# Patient Record
Sex: Female | Born: 1950
Health system: Southern US, Community
[De-identification: ages and names within clinical notes are randomized; demographics above are authoritative.]

## PROBLEM LIST (undated history)

## (undated) DIAGNOSIS — R7989 Other specified abnormal findings of blood chemistry: Secondary | ICD-10-CM

## (undated) DIAGNOSIS — N189 Chronic kidney disease, unspecified: Secondary | ICD-10-CM

## (undated) DIAGNOSIS — Z9889 Other specified postprocedural states: Secondary | ICD-10-CM

## (undated) DIAGNOSIS — R112 Nausea with vomiting, unspecified: Secondary | ICD-10-CM

## (undated) DIAGNOSIS — E039 Hypothyroidism, unspecified: Secondary | ICD-10-CM

## (undated) DIAGNOSIS — G43909 Migraine, unspecified, not intractable, without status migrainosus: Secondary | ICD-10-CM

## (undated) DIAGNOSIS — S3219XA Other fracture of sacrum, initial encounter for closed fracture: Secondary | ICD-10-CM

## (undated) DIAGNOSIS — M479 Spondylosis, unspecified: Secondary | ICD-10-CM

## (undated) DIAGNOSIS — Z8679 Personal history of other diseases of the circulatory system: Secondary | ICD-10-CM

## (undated) DIAGNOSIS — R87619 Unspecified abnormal cytological findings in specimens from cervix uteri: Secondary | ICD-10-CM

## (undated) DIAGNOSIS — E119 Type 2 diabetes mellitus without complications: Secondary | ICD-10-CM

## (undated) DIAGNOSIS — C9030 Solitary plasmacytoma not having achieved remission: Secondary | ICD-10-CM

## (undated) DIAGNOSIS — I10 Essential (primary) hypertension: Secondary | ICD-10-CM

## (undated) DIAGNOSIS — D219 Benign neoplasm of connective and other soft tissue, unspecified: Secondary | ICD-10-CM

## (undated) DIAGNOSIS — M1990 Unspecified osteoarthritis, unspecified site: Secondary | ICD-10-CM

## (undated) DIAGNOSIS — N939 Abnormal uterine and vaginal bleeding, unspecified: Secondary | ICD-10-CM

## (undated) DIAGNOSIS — D4709 Other mast cell neoplasms of uncertain behavior: Secondary | ICD-10-CM

## (undated) DIAGNOSIS — R011 Cardiac murmur, unspecified: Secondary | ICD-10-CM

## (undated) DIAGNOSIS — M8580 Other specified disorders of bone density and structure, unspecified site: Secondary | ICD-10-CM

## (undated) DIAGNOSIS — M353 Polymyalgia rheumatica: Secondary | ICD-10-CM

## (undated) DIAGNOSIS — D649 Anemia, unspecified: Secondary | ICD-10-CM

## (undated) DIAGNOSIS — Z803 Family history of malignant neoplasm of breast: Secondary | ICD-10-CM

## (undated) HISTORY — DX: Family history of malignant neoplasm of breast: Z80.3

## (undated) HISTORY — PX: BREAST BIOPSY: SHX20

## (undated) HISTORY — PX: CHOLECYSTECTOMY: SHX55

## (undated) HISTORY — DX: Cardiac murmur, unspecified: R01.1

## (undated) HISTORY — DX: Type 2 diabetes mellitus without complications: E11.9

## (undated) HISTORY — DX: Other specified disorders of bone density and structure, unspecified site: M85.80

## (undated) HISTORY — DX: Anemia, unspecified: D64.9

## (undated) HISTORY — PX: DILATION AND CURETTAGE OF UTERUS: SHX78

## (undated) HISTORY — PX: KNEE SURGERY: SHX244

## (undated) HISTORY — PX: COLONOSCOPY: SHX174

## (undated) HISTORY — DX: Other mast cell neoplasms of uncertain behavior: D47.09

## (undated) HISTORY — PX: CERVIX LESION DESTRUCTION: SHX591

## (undated) HISTORY — DX: Polymyalgia rheumatica: M35.3

## (undated) HISTORY — DX: Unspecified abnormal cytological findings in specimens from cervix uteri: R87.619

## (undated) HISTORY — DX: Benign neoplasm of connective and other soft tissue, unspecified: D21.9

## (undated) HISTORY — DX: Other specified abnormal findings of blood chemistry: R79.89

## (undated) HISTORY — DX: Other fracture of sacrum, initial encounter for closed fracture: S32.19XA

## (undated) HISTORY — DX: Essential (primary) hypertension: I10

## (undated) HISTORY — DX: Abnormal uterine and vaginal bleeding, unspecified: N93.9

## (undated) HISTORY — DX: Solitary plasmacytoma not having achieved remission: C90.30

---

## 1990-05-01 HISTORY — PX: CERVICAL BIOPSY  W/ LOOP ELECTRODE EXCISION: SUR135

## 1995-06-02 HISTORY — PX: HYSTEROSCOPY: SHX211

## 1997-10-26 ENCOUNTER — Ambulatory Visit (HOSPITAL_COMMUNITY): Admission: RE | Admit: 1997-10-26 | Discharge: 1997-10-26 | Payer: Self-pay | Admitting: Internal Medicine

## 1997-12-08 ENCOUNTER — Other Ambulatory Visit: Admission: RE | Admit: 1997-12-08 | Discharge: 1997-12-08 | Payer: Self-pay | Admitting: Obstetrics and Gynecology

## 1998-01-05 ENCOUNTER — Encounter: Admission: RE | Admit: 1998-01-05 | Discharge: 1998-01-05 | Payer: Self-pay | Admitting: Family Medicine

## 1998-06-01 HISTORY — PX: HYSTEROSCOPY: SHX211

## 1998-08-23 ENCOUNTER — Encounter: Payer: Self-pay | Admitting: Obstetrics and Gynecology

## 1998-08-24 ENCOUNTER — Observation Stay (HOSPITAL_COMMUNITY): Admission: RE | Admit: 1998-08-24 | Discharge: 1998-08-24 | Payer: Self-pay | Admitting: Obstetrics and Gynecology

## 1998-12-31 ENCOUNTER — Other Ambulatory Visit: Admission: RE | Admit: 1998-12-31 | Discharge: 1998-12-31 | Payer: Self-pay | Admitting: Obstetrics and Gynecology

## 1999-12-28 ENCOUNTER — Other Ambulatory Visit: Admission: RE | Admit: 1999-12-28 | Discharge: 1999-12-28 | Payer: Self-pay | Admitting: Obstetrics and Gynecology

## 2000-01-10 ENCOUNTER — Other Ambulatory Visit: Admission: RE | Admit: 2000-01-10 | Discharge: 2000-01-10 | Payer: Self-pay | Admitting: Obstetrics and Gynecology

## 2000-01-10 ENCOUNTER — Encounter (INDEPENDENT_AMBULATORY_CARE_PROVIDER_SITE_OTHER): Payer: Self-pay | Admitting: Specialist

## 2000-04-20 ENCOUNTER — Ambulatory Visit (HOSPITAL_COMMUNITY): Admission: RE | Admit: 2000-04-20 | Discharge: 2000-04-20 | Payer: Self-pay | Admitting: Gastroenterology

## 2000-05-29 ENCOUNTER — Ambulatory Visit (HOSPITAL_COMMUNITY): Admission: RE | Admit: 2000-05-29 | Discharge: 2000-05-29 | Payer: Self-pay | Admitting: Obstetrics and Gynecology

## 2001-02-06 ENCOUNTER — Other Ambulatory Visit: Admission: RE | Admit: 2001-02-06 | Discharge: 2001-02-06 | Payer: Self-pay | Admitting: Obstetrics and Gynecology

## 2001-08-28 ENCOUNTER — Encounter: Admission: RE | Admit: 2001-08-28 | Discharge: 2001-08-28 | Payer: Self-pay | Admitting: Obstetrics and Gynecology

## 2001-08-28 ENCOUNTER — Encounter: Payer: Self-pay | Admitting: Obstetrics and Gynecology

## 2001-08-29 ENCOUNTER — Encounter: Payer: Self-pay | Admitting: Obstetrics and Gynecology

## 2001-08-29 ENCOUNTER — Encounter: Admission: RE | Admit: 2001-08-29 | Discharge: 2001-08-29 | Payer: Self-pay | Admitting: Obstetrics and Gynecology

## 2002-01-27 ENCOUNTER — Observation Stay (HOSPITAL_COMMUNITY): Admission: EM | Admit: 2002-01-27 | Discharge: 2002-01-27 | Payer: Self-pay | Admitting: Emergency Medicine

## 2002-07-01 ENCOUNTER — Other Ambulatory Visit: Admission: RE | Admit: 2002-07-01 | Discharge: 2002-07-01 | Payer: Self-pay | Admitting: Obstetrics and Gynecology

## 2002-10-15 ENCOUNTER — Encounter: Admission: RE | Admit: 2002-10-15 | Discharge: 2002-10-15 | Payer: Self-pay | Admitting: Obstetrics and Gynecology

## 2002-10-15 ENCOUNTER — Encounter: Payer: Self-pay | Admitting: Obstetrics and Gynecology

## 2002-12-12 ENCOUNTER — Encounter: Payer: Self-pay | Admitting: Orthopedic Surgery

## 2002-12-12 ENCOUNTER — Encounter: Admission: RE | Admit: 2002-12-12 | Discharge: 2002-12-12 | Payer: Self-pay | Admitting: Orthopedic Surgery

## 2003-07-07 ENCOUNTER — Other Ambulatory Visit: Admission: RE | Admit: 2003-07-07 | Discharge: 2003-07-07 | Payer: Self-pay | Admitting: Obstetrics and Gynecology

## 2003-12-11 ENCOUNTER — Encounter: Admission: RE | Admit: 2003-12-11 | Discharge: 2003-12-11 | Payer: Self-pay

## 2004-03-09 ENCOUNTER — Ambulatory Visit: Payer: Self-pay | Admitting: Psychology

## 2004-03-28 ENCOUNTER — Encounter: Admission: RE | Admit: 2004-03-28 | Discharge: 2004-03-28 | Payer: Self-pay | Admitting: Obstetrics and Gynecology

## 2004-03-29 ENCOUNTER — Ambulatory Visit: Payer: Self-pay | Admitting: Psychology

## 2004-04-11 ENCOUNTER — Ambulatory Visit: Payer: Self-pay | Admitting: Psychology

## 2004-05-10 ENCOUNTER — Ambulatory Visit: Payer: Self-pay | Admitting: Psychology

## 2004-09-02 ENCOUNTER — Other Ambulatory Visit: Admission: RE | Admit: 2004-09-02 | Discharge: 2004-09-02 | Payer: Self-pay | Admitting: Obstetrics and Gynecology

## 2005-01-18 ENCOUNTER — Ambulatory Visit: Payer: Self-pay | Admitting: Psychology

## 2005-03-03 ENCOUNTER — Emergency Department (HOSPITAL_COMMUNITY): Admission: EM | Admit: 2005-03-03 | Discharge: 2005-03-03 | Payer: Self-pay | Admitting: *Deleted

## 2005-06-01 ENCOUNTER — Encounter: Admission: RE | Admit: 2005-06-01 | Discharge: 2005-06-01 | Payer: Self-pay | Admitting: Obstetrics and Gynecology

## 2005-07-05 ENCOUNTER — Encounter: Admission: RE | Admit: 2005-07-05 | Discharge: 2005-07-05 | Payer: Self-pay | Admitting: Internal Medicine

## 2005-08-01 ENCOUNTER — Ambulatory Visit: Payer: Self-pay | Admitting: Psychology

## 2005-08-24 ENCOUNTER — Ambulatory Visit: Payer: Self-pay | Admitting: Psychology

## 2005-09-06 ENCOUNTER — Ambulatory Visit: Payer: Self-pay | Admitting: Psychology

## 2005-09-20 ENCOUNTER — Ambulatory Visit: Payer: Self-pay | Admitting: Psychology

## 2005-10-04 ENCOUNTER — Ambulatory Visit: Payer: Self-pay | Admitting: Psychology

## 2005-10-26 ENCOUNTER — Ambulatory Visit: Payer: Self-pay | Admitting: Psychology

## 2005-11-29 ENCOUNTER — Ambulatory Visit: Payer: Self-pay | Admitting: Psychology

## 2006-01-31 ENCOUNTER — Ambulatory Visit: Payer: Self-pay | Admitting: Psychology

## 2006-02-15 ENCOUNTER — Ambulatory Visit: Payer: Self-pay | Admitting: Psychology

## 2006-03-07 ENCOUNTER — Ambulatory Visit: Payer: Self-pay | Admitting: Psychology

## 2006-04-04 ENCOUNTER — Ambulatory Visit: Payer: Self-pay | Admitting: Psychology

## 2006-05-16 ENCOUNTER — Ambulatory Visit: Payer: Self-pay | Admitting: Psychology

## 2006-06-14 ENCOUNTER — Other Ambulatory Visit: Admission: RE | Admit: 2006-06-14 | Discharge: 2006-06-14 | Payer: Self-pay | Admitting: Obstetrics and Gynecology

## 2006-06-20 ENCOUNTER — Ambulatory Visit: Payer: Self-pay | Admitting: Psychology

## 2006-06-27 ENCOUNTER — Encounter: Admission: RE | Admit: 2006-06-27 | Discharge: 2006-06-27 | Payer: Self-pay | Admitting: Obstetrics and Gynecology

## 2006-07-17 ENCOUNTER — Ambulatory Visit: Payer: Self-pay | Admitting: Psychology

## 2006-08-15 ENCOUNTER — Ambulatory Visit: Payer: Self-pay | Admitting: Psychology

## 2006-09-05 ENCOUNTER — Ambulatory Visit: Payer: Self-pay | Admitting: Psychology

## 2006-09-25 ENCOUNTER — Ambulatory Visit: Payer: Self-pay | Admitting: Psychology

## 2007-07-24 ENCOUNTER — Ambulatory Visit: Payer: Self-pay | Admitting: Psychology

## 2007-12-03 ENCOUNTER — Ambulatory Visit: Payer: Self-pay | Admitting: Psychology

## 2007-12-09 ENCOUNTER — Other Ambulatory Visit: Admission: RE | Admit: 2007-12-09 | Discharge: 2007-12-09 | Payer: Self-pay | Admitting: Obstetrics and Gynecology

## 2008-01-21 ENCOUNTER — Encounter: Admission: RE | Admit: 2008-01-21 | Discharge: 2008-01-21 | Payer: Self-pay | Admitting: Obstetrics and Gynecology

## 2008-02-19 ENCOUNTER — Encounter (INDEPENDENT_AMBULATORY_CARE_PROVIDER_SITE_OTHER): Payer: Self-pay | Admitting: Family Medicine

## 2008-02-19 ENCOUNTER — Ambulatory Visit: Payer: Self-pay

## 2008-02-19 DIAGNOSIS — M7750 Other enthesopathy of unspecified foot: Secondary | ICD-10-CM | POA: Insufficient documentation

## 2008-02-19 DIAGNOSIS — M479 Spondylosis, unspecified: Secondary | ICD-10-CM | POA: Insufficient documentation

## 2008-02-19 DIAGNOSIS — M1990 Unspecified osteoarthritis, unspecified site: Secondary | ICD-10-CM | POA: Insufficient documentation

## 2008-02-19 DIAGNOSIS — M2170 Unequal limb length (acquired), unspecified site: Secondary | ICD-10-CM

## 2008-02-20 ENCOUNTER — Encounter: Payer: Self-pay | Admitting: Sports Medicine

## 2008-04-01 ENCOUNTER — Ambulatory Visit: Payer: Self-pay | Admitting: Psychology

## 2008-06-23 ENCOUNTER — Encounter: Admission: RE | Admit: 2008-06-23 | Discharge: 2008-09-21 | Payer: Self-pay | Admitting: Surgery

## 2008-06-24 ENCOUNTER — Ambulatory Visit (HOSPITAL_COMMUNITY): Admission: RE | Admit: 2008-06-24 | Discharge: 2008-06-24 | Payer: Self-pay | Admitting: Surgery

## 2008-06-25 ENCOUNTER — Ambulatory Visit (HOSPITAL_COMMUNITY): Admission: RE | Admit: 2008-06-25 | Discharge: 2008-06-25 | Payer: Self-pay | Admitting: Surgery

## 2008-06-25 ENCOUNTER — Ambulatory Visit: Payer: Self-pay | Admitting: Psychology

## 2008-08-03 ENCOUNTER — Ambulatory Visit (HOSPITAL_COMMUNITY): Admission: RE | Admit: 2008-08-03 | Discharge: 2008-08-04 | Payer: Self-pay | Admitting: Surgery

## 2008-08-03 HISTORY — PX: LAPAROSCOPIC GASTRIC BANDING: SHX1100

## 2008-09-29 ENCOUNTER — Encounter: Admission: RE | Admit: 2008-09-29 | Discharge: 2008-12-28 | Payer: Self-pay | Admitting: Surgery

## 2008-11-12 ENCOUNTER — Ambulatory Visit: Payer: Self-pay | Admitting: Psychology

## 2008-12-10 ENCOUNTER — Other Ambulatory Visit: Admission: RE | Admit: 2008-12-10 | Discharge: 2008-12-10 | Payer: Self-pay | Admitting: Obstetrics and Gynecology

## 2008-12-10 ENCOUNTER — Ambulatory Visit: Payer: Self-pay | Admitting: Obstetrics and Gynecology

## 2008-12-10 ENCOUNTER — Encounter: Payer: Self-pay | Admitting: Obstetrics and Gynecology

## 2009-02-11 ENCOUNTER — Encounter: Admission: RE | Admit: 2009-02-11 | Discharge: 2009-02-11 | Payer: Self-pay | Admitting: Surgery

## 2009-04-09 ENCOUNTER — Encounter: Admission: RE | Admit: 2009-04-09 | Discharge: 2009-04-09 | Payer: Self-pay | Admitting: Obstetrics and Gynecology

## 2009-05-03 ENCOUNTER — Emergency Department (HOSPITAL_COMMUNITY): Admission: EM | Admit: 2009-05-03 | Discharge: 2009-05-03 | Payer: Self-pay | Admitting: Emergency Medicine

## 2009-12-27 ENCOUNTER — Other Ambulatory Visit: Admission: RE | Admit: 2009-12-27 | Discharge: 2009-12-27 | Payer: Self-pay | Admitting: Obstetrics and Gynecology

## 2009-12-27 ENCOUNTER — Ambulatory Visit: Payer: Self-pay | Admitting: Obstetrics and Gynecology

## 2010-05-10 ENCOUNTER — Encounter
Admission: RE | Admit: 2010-05-10 | Discharge: 2010-05-10 | Payer: Self-pay | Source: Home / Self Care | Attending: Obstetrics and Gynecology | Admitting: Obstetrics and Gynecology

## 2010-05-22 ENCOUNTER — Encounter: Payer: Self-pay | Admitting: Internal Medicine

## 2010-08-10 LAB — DIFFERENTIAL
Basophils Absolute: 0 10*3/uL (ref 0.0–0.1)
Neutro Abs: 8.3 10*3/uL — ABNORMAL HIGH (ref 1.7–7.7)
Neutrophils Relative %: 80 % — ABNORMAL HIGH (ref 43–77)

## 2010-08-10 LAB — CBC
Hemoglobin: 11.3 g/dL — ABNORMAL LOW (ref 12.0–15.0)
MCHC: 33.7 g/dL (ref 30.0–36.0)
Platelets: 195 10*3/uL (ref 150–400)
RBC: 3.56 MIL/uL — ABNORMAL LOW (ref 3.87–5.11)
WBC: 10.4 10*3/uL (ref 4.0–10.5)

## 2010-08-11 LAB — DIFFERENTIAL
Eosinophils Absolute: 0.4 10*3/uL (ref 0.0–0.7)
Eosinophils Relative: 4 % (ref 0–5)
Lymphocytes Relative: 13 % (ref 12–46)
Monocytes Absolute: 0.7 10*3/uL (ref 0.1–1.0)
Neutro Abs: 6.4 10*3/uL (ref 1.7–7.7)
Neutrophils Relative %: 74 % (ref 43–77)

## 2010-08-11 LAB — CBC
Hemoglobin: 12.4 g/dL (ref 12.0–15.0)
Platelets: 222 10*3/uL (ref 150–400)
WBC: 8.6 10*3/uL (ref 4.0–10.5)

## 2010-08-11 LAB — COMPREHENSIVE METABOLIC PANEL
ALT: 45 U/L — ABNORMAL HIGH (ref 0–35)
CO2: 26 mEq/L (ref 19–32)
GFR calc Af Amer: >60 mL/min (ref >60)
Glucose, Bld: 190 mg/dL — ABNORMAL HIGH (ref 70–99)
Potassium: 4.5 mEq/L (ref 3.5–5.1)
Sodium: 140 mEq/L (ref 135–145)

## 2010-09-13 NOTE — Op Note (Signed)
Gabrielle Lynch, Gabrielle Lynch               ACCOUNT NO.:  [id number]   MEDICAL RECORD NO.:  [id number]          PATIENT TYPE:  AMB   LOCATION:  DAY                          FACILITY:  [facility]   PHYSICIAN:  Thornton Park. Daphine Deutscher, MD  DATE OF BIRTH:  02-Nov-1950   DATE OF PROCEDURE:  DATE OF DISCHARGE:                               OPERATIVE REPORT   PREOPERATIVE DIAGNOSES:  Morbid obesity, body mass index 38.   POSTOPERATIVE DIAGNOSES:  Morbid obesity, body mass index 38.   PROCEDURE:  Lap-Band (Allergan APS system).   SURGEON:  Thornton Park. Daphine Deutscher, M.D.   ASSISTANT:  Sandria Bales. Ezzard Standing, M.D.   ANESTHESIA:  General endotracheal.   DESCRIPTION OF PROCEDURE:  This is a 60 year old lady brought to [facility] OR[facility]1 on Monday, August 03, 2008, given general anesthesia.  The  abdomen was prepped with a Techni-Care equivalent and draped sterilely.  The abdomen was entered through the left upper quadrant using a 0-degree  10-mm Optiview without difficulty.  The abdomen was insufflated and  standard band port placements including a 15 in the right upper  quadrant, a 5 in the upper midline for the [facility], an 11 to the left  of the umbilicus, and another 11 down to the right of the umbilicus.  With those in place, we surveyed the abdomen, put the [facility]  retractor in place, and did not see any abnormalities that were noted.  The patient has had a previous lap chole.  The esophagogastric junction  was as expected.  I made a little incision over on the left crus for the  exit site of the band passer.  I then took a pars flaccida technique,  incised the gastrohepatic window, went down and created a spot along the  right crus.  The band passer passed easily around and up.   A standard APS system was then inserted.  The tubing was placed through  the band passer and brought around, engaged.  I had previously passed  the calibration tubing and blown up a balloon and it did not pull back,  so there not a  hiatal hernia.  The band was engaged around the tubing  and then calibration tubing was withdrawn.  The bowel was plicated using  interrupted Surgidacs with the free needles held in place with tie  knots.  Nice band plication was felt to be present, getting good  purchases up on the proximal little gastric pouch.   The tubing was then brought out through the right lower port and  connected to port.  This port site was then opened and the fascia was  injected and it was affixed to the fascia with 4 interrupted 2-0  Prolene.  The wound was then irrigated and all wounds were irrigated and  closed with 4-0 Vicryl with Benzoin and Steri-Strips.  The patient  tolerated the procedure well and was taken to the recovery room in  satisfactory condition.   The patient experienced no MAST cell difficulties during the procedure  and will be monitored in __________ step-down overnight.      Thornton Park Daphine Deutscher,  MD  Electronically Signed     MBM/MEDQ  D:  08/03/2008  T:  08/03/2008  Job:  [id number]   cc:   Theressa Millard, M.D.  Fax: [phone]

## 2010-10-19 ENCOUNTER — Other Ambulatory Visit: Payer: Self-pay | Admitting: Obstetrics and Gynecology

## 2010-10-19 ENCOUNTER — Ambulatory Visit (INDEPENDENT_AMBULATORY_CARE_PROVIDER_SITE_OTHER): Payer: BC Managed Care – PPO | Admitting: Obstetrics and Gynecology

## 2010-10-19 DIAGNOSIS — N950 Postmenopausal bleeding: Secondary | ICD-10-CM

## 2010-10-19 DIAGNOSIS — D250 Submucous leiomyoma of uterus: Secondary | ICD-10-CM

## 2010-10-19 DIAGNOSIS — D259 Leiomyoma of uterus, unspecified: Secondary | ICD-10-CM

## 2010-10-25 ENCOUNTER — Ambulatory Visit (INDEPENDENT_AMBULATORY_CARE_PROVIDER_SITE_OTHER): Payer: BC Managed Care – PPO | Admitting: Obstetrics and Gynecology

## 2010-10-25 ENCOUNTER — Other Ambulatory Visit: Payer: BC Managed Care – PPO

## 2010-10-25 DIAGNOSIS — D259 Leiomyoma of uterus, unspecified: Secondary | ICD-10-CM

## 2010-10-25 DIAGNOSIS — N949 Unspecified condition associated with female genital organs and menstrual cycle: Secondary | ICD-10-CM

## 2010-10-25 DIAGNOSIS — N950 Postmenopausal bleeding: Secondary | ICD-10-CM

## 2010-10-26 ENCOUNTER — Ambulatory Visit: Payer: BC Managed Care – PPO | Admitting: Obstetrics and Gynecology

## 2010-10-26 ENCOUNTER — Other Ambulatory Visit: Payer: BC Managed Care – PPO

## 2010-11-24 ENCOUNTER — Ambulatory Visit (INDEPENDENT_AMBULATORY_CARE_PROVIDER_SITE_OTHER): Payer: BC Managed Care – PPO | Admitting: Surgery

## 2010-11-24 VITALS — BP 142/82

## 2010-11-24 DIAGNOSIS — Z4651 Encounter for fitting and adjustment of gastric lap band: Secondary | ICD-10-CM

## 2010-11-24 DIAGNOSIS — Z9884 Bariatric surgery status: Secondary | ICD-10-CM

## 2010-11-24 NOTE — Progress Notes (Signed)
Gabrielle Lynch comes in today head with a weight of 195. She's lost 3 pounds since her last visit in June. She knows that some carbs is prepped and her diet. She needed a little bit of the fill so I added 0.3 cc to her band.  She was able to drink liquids fine. She is scheduled to have a hysterectomy in October but then he got siting. I will probably see her prior to this. She has large uterine fibroids. She's also had some urinary incontinence issues he may have sick [place] looking at the same time.

## 2010-12-22 ENCOUNTER — Telehealth: Payer: Self-pay

## 2010-12-22 NOTE — Telephone Encounter (Signed)
When Dr. Reece Agar gave me chart for scheduling surgery he had mentioned patient had stopped her progesterone and based on when she scheduled would need instruction about whether to restart it or not.  Patient has been using her Estring but had d/c'd progesterone back at end of June.  Her surgery is scheduled for October 22nd and in light of that Dr. Reece Agar recommended that she take her Prometrium for Days 1-12 of September but will not need to take it in October. Patient was instructed regarding this.  I, also, told patient that I had talked with Dr. Cristela Blue and that I had emailed him her surgery info and that he was to be calling Dr. Reece Agar back soon. KA

## 2010-12-23 ENCOUNTER — Encounter: Payer: Self-pay | Admitting: Obstetrics and Gynecology

## 2010-12-23 NOTE — Progress Notes (Signed)
Dr. Reece Agar had asked me to call Olegario Messier at Dr. Newell Coral office to ask her to find out the name of an IV solution that would be used if Gabrielle Lynch had some sort of mast cell reaction during surgery.  Olegario Messier said she checked with Dr. Earl Gala and had said he was not aware of any such solution. Just need to be aware of her hypotension and treatment with epinephrine and to be sure anesthesiologist was aware. ka

## 2010-12-26 ENCOUNTER — Telehealth: Payer: Self-pay

## 2010-12-26 NOTE — Telephone Encounter (Signed)
no

## 2010-12-26 NOTE — Telephone Encounter (Signed)
I called Chrisma to let her know that Dr. Reece Agar talked with Dr. Cristela Blue and he will be unavailable for her surgery as he will be on call for OB. He will be around that night to take care of Gabrielle Lynch.  He has spoken with Dr. Brayton Caves, who Dr. Reece Agar said is excellent, and he will be there for her case.  Gabrielle Lynch was also advised that Dr. Cristela Blue recommended her anesthesia preop appt be the week before surgery. Patient tells me that she will be in [place] from 02/08/11 until 02/18/11 which is the Sat before surgery is on Monday.  I will let the hospital know and get them to call her as soon as that schedule is ready.  Do you think I need to check with Dr. Cristela Blue to be sure that 2 weeks before surgery will be okay for preop labs/ anesthesia consult, etc.?????

## 2010-12-28 ENCOUNTER — Encounter (INDEPENDENT_AMBULATORY_CARE_PROVIDER_SITE_OTHER): Payer: Self-pay | Admitting: Surgery

## 2010-12-28 NOTE — Progress Notes (Signed)
Gabrielle Lynch comes in today and has lost 7 pounds blood has had obstructive symptoms for the last 2 weeks. She'll return from [place] he notices since then has had problems with regurgitation. Today are removed a little bit under 0.3 cc from her band. She was able to drink freely after that. She is about to go out of town on Friday.  She is having a hysterectomy and bladder suspension by doctors Gottsegen and Patsi Sears respectively on October 22. This is scheduled at [facility]. She will be getting back in town from [place] on on Saturday and having her surgery on Monday. I told her that I would be glad to drop [facility] and remove some fluid from her band at that time so she doesn't have problems with nausea and vomiting at the time of her surgery. A copy  of this note to Dr. Eda Paschal and Patsi Sears.

## 2011-01-17 DIAGNOSIS — N879 Dysplasia of cervix uteri, unspecified: Secondary | ICD-10-CM | POA: Insufficient documentation

## 2011-01-17 DIAGNOSIS — D219 Benign neoplasm of connective and other soft tissue, unspecified: Secondary | ICD-10-CM | POA: Insufficient documentation

## 2011-01-17 DIAGNOSIS — I10 Essential (primary) hypertension: Secondary | ICD-10-CM | POA: Insufficient documentation

## 2011-01-17 DIAGNOSIS — D4709 Other mast cell neoplasms of uncertain behavior: Secondary | ICD-10-CM | POA: Insufficient documentation

## 2011-01-25 ENCOUNTER — Other Ambulatory Visit: Payer: Self-pay | Admitting: *Deleted

## 2011-01-25 MED ORDER — VENLAFAXINE HCL ER 75 MG PO CP24: 75.0000 mg | ORAL_CAPSULE | Freq: Every day | ORAL | Status: DC

## 2011-01-26 MED ORDER — EFFEXOR XR 75 MG PO CP24: 75.0000 mg | ORAL_CAPSULE | Freq: Every day | ORAL | Status: DC

## 2011-01-26 NOTE — Telephone Encounter (Signed)
Addended byValeda Malm L on: 01/26/2011 10:24 AM   Modules accepted: Orders

## 2011-02-06 ENCOUNTER — Encounter (HOSPITAL_COMMUNITY)
Admission: RE | Admit: 2011-02-06 | Discharge: 2011-02-06 | Disposition: A | Discharge status: Home / Self Care | Payer: BC Managed Care – PPO | Source: Ambulatory Visit | Attending: Obstetrics and Gynecology | Admitting: Obstetrics and Gynecology

## 2011-02-06 ENCOUNTER — Encounter (HOSPITAL_COMMUNITY): Payer: Self-pay

## 2011-02-06 HISTORY — DX: Other specified postprocedural states: Z98.890

## 2011-02-06 HISTORY — DX: Nausea with vomiting, unspecified: R11.2

## 2011-02-06 LAB — CBC
MCHC: 32.6 g/dL (ref 30.0–36.0)
Platelets: 272 10*3/uL (ref 150–400)
RDW: 12.9 % (ref 11.5–15.5)
WBC: 8.6 10*3/uL (ref 4.0–10.5)

## 2011-02-06 LAB — SURGICAL PCR SCREEN
MRSA, PCR: NEGATIVE
Staphylococcus aureus: NEGATIVE

## 2011-02-06 MED ORDER — ACETAMINOPHEN 10 MG/ML IV SOLN: 1000.0000 mg | Freq: Four times a day (QID) | INTRAVENOUS | Status: DC

## 2011-02-06 NOTE — Patient Instructions (Addendum)
   Your procedure is scheduled WU:[id number], October 22nd  Enter through the Main Entrance of [facility] at: [employer] up the phone at the desk and dial [phone] and inform us of your arrival  Please call this number if you have any problems the morning of surgery: [phone]  Remember: Do not eat food after midnight  Do not drink clear liquids after:midnight Take these medicines the morning of surgery with a SIP OF WATER: morning medications  Do not wear jewelry, make-up, or FINGER nail polish Do not wear lotions, powders, or perfumes.  Do not shave 48 hours prior to surgery. Do not bring valuables to the hospital. Leave suitcase in the car. After Surgery it may be brought to your room. For patients being admitted to the hospital, checkout time is 11:00am the day of discharge.    Remember to use your hibiclens as instructed.Please shower with 1/2 bottle the evening before your surgery and the other 1/2 bottle the morning of surgery.

## 2011-02-06 NOTE — Pre-Procedure Instructions (Addendum)
Dr Rodman Pickle spoke with pt briefly-will see lsd, requested old anesthesia record from 2010 [place] health information-from [place] [id number]

## 2011-02-07 ENCOUNTER — Ambulatory Visit (INDEPENDENT_AMBULATORY_CARE_PROVIDER_SITE_OTHER): Payer: BC Managed Care – PPO | Admitting: Obstetrics and Gynecology

## 2011-02-07 DIAGNOSIS — N950 Postmenopausal bleeding: Secondary | ICD-10-CM

## 2011-02-07 DIAGNOSIS — D259 Leiomyoma of uterus, unspecified: Secondary | ICD-10-CM

## 2011-02-07 DIAGNOSIS — D219 Benign neoplasm of connective and other soft tissue, unspecified: Secondary | ICD-10-CM

## 2011-02-07 NOTE — H&P (Addendum)
Chief complaint: Symptomatic fibroids. History of present illness: Patient is a 60 year old gravida 2 para 2 who is having chronic trouble with fibroids. The problems consists of multiple episodes of recurrent postmenopausal bleeding when she takes estrogen and inability for her to function without the estrogen. She has had multiple endometrial biopsies. She's had multiple SIH's. All remains normal except for the fibroids. Her last ultrasound showed a very enlarged uterus with greater than 10 fibroids with 3 or 4 of them greater than 4 cm. She has no ovarian pathology. Her other issue with the fibroids has been recurrent lower abdominal pain secondary to mechanical pressure from them. We have tried multiple protocols with progesterone to limit the bleeding including estrogen only with frequent endometrial biopsies without any success. She therefore enters the hospital now for total abdominal hysterectomy, bilateral salpingo-oophorectomy. The size of the fibroids makes [place] feel that we would not get good access laterally to the blood supply to do minimally invasive surgery. 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 In addition the patient is urinary stress incontinence and will undergo a sling procedure by the urologist at the same time.  Please see epic record for past medical history, family history, social history, and review of systems.  Physical examination: HEENT within normal limits. Neck: Thyroid not large. No masses. Supraclavicular nodes: not enlarged. Breasts: Examined in both sitting midline position. No skin changes and no masses. Abdomen: Soft no guarding rebound or masses or hernia. Pelvic: External: Within normal limits. BUS: Within normal limits. Vaginal:within normal limits. Good estrogen effect. No evidence of cystocele rectocele or enterocele. Cervix: clean. Uterus: enlarged by fibroids to  greater than three times normal size. Adnexa: No masses. Rectovaginal exam: Confirmatory and negative. Extremities: Within normal limits.  Assessment: Symptomatic fibroids, urinary stress incontinence.  Plan: Total abdominal  hysterectomy, bilateral salpingo-oophorectomy, sling procedure.  Date of Initial H&P: 02/07/2011  History reviewed, patient examined, no change in status, stable for surgery.  No changes.

## 2011-02-07 NOTE — Progress Notes (Signed)
The patient came to see me today to discuss her persistent postmenopausal bleeding with fibroids. She's also having a lot of lower abdominal pressure. She also has urinary stress incontinence and seen urologist and thinks a sling would help her. We discussed definitive surgery today. I told her because of her fibroids and her Mast cell problems I thought her surgery would go better through an incision rather than minimally invasive surgery. She is fine with that. She has had 2 cesarean sections. We discussed stopping NSAIDs before surgery. She will continue her Mobic for Mast cell protection. She will take her vaginal ring out 3 days before the surgery. We discussed normal recuperation and convalescence. We discussed risks of surgery including infection, DVT, wound dehiscence, injury to bowel bladder or ureter, or fistula formation. We discussed the challenges with 2 previous cesarean sections. All her questions were answered. Time of interval was 30 minutes.

## 2011-02-08 NOTE — Telephone Encounter (Signed)
error 

## 2011-02-17 MED ORDER — ACETAMINOPHEN 10 MG/ML IV SOLN: 1000.0000 mg | INTRAVENOUS | Status: DC

## 2011-02-20 ENCOUNTER — Encounter (HOSPITAL_COMMUNITY): Payer: Self-pay | Admitting: Anesthesiology

## 2011-02-20 ENCOUNTER — Encounter (HOSPITAL_COMMUNITY): Payer: Self-pay | Admitting: *Deleted

## 2011-02-20 ENCOUNTER — Encounter: Payer: Self-pay | Admitting: Gynecology

## 2011-02-20 ENCOUNTER — Inpatient Hospital Stay (HOSPITAL_COMMUNITY): Payer: BC Managed Care – PPO | Admitting: Anesthesiology

## 2011-02-20 ENCOUNTER — Inpatient Hospital Stay (HOSPITAL_COMMUNITY)
Admission: RE | Admit: 2011-02-20 | Discharge: 2011-02-22 | DRG: 359 | Disposition: A | Discharge status: Home / Self Care | Payer: BC Managed Care – PPO | Source: Ambulatory Visit | Attending: Obstetrics and Gynecology | Admitting: Obstetrics and Gynecology

## 2011-02-20 ENCOUNTER — Other Ambulatory Visit: Payer: Self-pay | Admitting: Obstetrics and Gynecology

## 2011-02-20 ENCOUNTER — Encounter (HOSPITAL_COMMUNITY)
Admission: RE | Disposition: A | Discharge status: Home / Self Care | Payer: Self-pay | Source: Ambulatory Visit | Attending: Obstetrics and Gynecology

## 2011-02-20 DIAGNOSIS — D259 Leiomyoma of uterus, unspecified: Secondary | ICD-10-CM

## 2011-02-20 DIAGNOSIS — N801 Endometriosis of ovary: Secondary | ICD-10-CM | POA: Diagnosis present

## 2011-02-20 DIAGNOSIS — N950 Postmenopausal bleeding: Secondary | ICD-10-CM

## 2011-02-20 DIAGNOSIS — N949 Unspecified condition associated with female genital organs and menstrual cycle: Secondary | ICD-10-CM | POA: Diagnosis present

## 2011-02-20 DIAGNOSIS — N393 Stress incontinence (female) (male): Secondary | ICD-10-CM | POA: Diagnosis present

## 2011-02-20 DIAGNOSIS — N80109 Endometriosis of ovary, unspecified side, unspecified depth: Secondary | ICD-10-CM | POA: Diagnosis present

## 2011-02-20 HISTORY — PX: PUBOVAGINAL SLING: SHX1035

## 2011-02-20 HISTORY — PX: SALPINGOOPHORECTOMY: SHX82

## 2011-02-20 HISTORY — PX: ABDOMINAL HYSTERECTOMY: SHX81

## 2011-02-20 SURGERY — HYSTERECTOMY, ABDOMINAL
Anesthesia: General

## 2011-02-20 MED ORDER — HYDROMORPHONE HCL 1 MG/ML IJ SOLN
INTRAMUSCULAR | Status: AC
  Administered 2011-02-20: 0.25 mg via INTRAVENOUS
  Filled 2011-02-20: qty 1

## 2011-02-20 MED ORDER — DIPHENHYDRAMINE HCL 50 MG/ML IJ SOLN: 12.5000 mg | Freq: Four times a day (QID) | INTRAMUSCULAR | Status: DC | PRN

## 2011-02-20 MED ORDER — DEXTROSE-NACL 5-0.45 % IV SOLN
INTRAVENOUS | Status: DC
  Administered 2011-02-20 – 2011-02-21 (×3): via INTRAVENOUS
  Administered 2011-02-21: 125 mL/h via INTRAVENOUS
  Administered 2011-02-22: 05:00:00 via INTRAVENOUS

## 2011-02-20 MED ORDER — LORATADINE 10 MG PO TABS
10.0000 mg | ORAL_TABLET | Freq: Every day | ORAL | Status: DC
  Filled 2011-02-20 (×3): qty 1

## 2011-02-20 MED ORDER — NALOXONE HCL 0.4 MG/ML IJ SOLN: 0.4000 mg | INTRAMUSCULAR | Status: DC | PRN

## 2011-02-20 MED ORDER — GLYCOPYRROLATE 0.2 MG/ML IJ SOLN
INTRAMUSCULAR | Status: AC
  Filled 2011-02-20: qty 2

## 2011-02-20 MED ORDER — LORATADINE 10 MG PO TABS: 10.0000 mg | ORAL_TABLET | Freq: Every day | ORAL | Status: DC

## 2011-02-20 MED ORDER — DIPHENHYDRAMINE HCL 12.5 MG/5ML PO ELIX: 12.5000 mg | ORAL_SOLUTION | Freq: Four times a day (QID) | ORAL | Status: DC | PRN

## 2011-02-20 MED ORDER — STERILE WATER FOR IRRIGATION IR SOLN
Status: DC | PRN
  Administered 2011-02-20: 1000 mL

## 2011-02-20 MED ORDER — ESTRADIOL 0.1 MG/GM VA CREA
TOPICAL_CREAM | VAGINAL | Status: DC | PRN
  Administered 2011-02-20: 1 via VAGINAL

## 2011-02-20 MED ORDER — SODIUM CHLORIDE 0.9 % IR SOLN
Status: DC | PRN
  Administered 2011-02-20: 10:00:00

## 2011-02-20 MED ORDER — HYDROMORPHONE 0.3 MG/ML IV SOLN
INTRAVENOUS | Status: DC
  Administered 2011-02-20: 0.6 mg via INTRAVENOUS
  Administered 2011-02-20: 0.9 mg via INTRAVENOUS
  Administered 2011-02-20: 2.1 mg via INTRAVENOUS
  Administered 2011-02-20: 13:00:00 via INTRAVENOUS
  Administered 2011-02-21: 1.2 mg via INTRAVENOUS
  Administered 2011-02-21: 0.3 mg via INTRAVENOUS

## 2011-02-20 MED ORDER — CEFAZOLIN SODIUM 1-5 GM-% IV SOLN: 1.0000 g | INTRAVENOUS | Status: DC

## 2011-02-20 MED ORDER — PANTOPRAZOLE SODIUM 40 MG PO TBEC
40.0000 mg | DELAYED_RELEASE_TABLET | Freq: Once | ORAL | Status: DC
  Filled 2011-02-20: qty 1

## 2011-02-20 MED ORDER — ACETAMINOPHEN 10 MG/ML IV SOLN
1000.0000 mg | Freq: Once | INTRAVENOUS | Status: AC
  Filled 2011-02-20: qty 100

## 2011-02-20 MED ORDER — LIDOCAINE HCL (CARDIAC) 20 MG/ML IV SOLN
INTRAVENOUS | Status: AC
  Filled 2011-02-20: qty 5

## 2011-02-20 MED ORDER — HYDROMORPHONE HCL 1 MG/ML IJ SOLN
INTRAMUSCULAR | Status: DC | PRN
  Administered 2011-02-20: 1 mg via INTRAVENOUS

## 2011-02-20 MED ORDER — MONTELUKAST SODIUM 10 MG PO TABS: 10.0000 mg | ORAL_TABLET | Freq: Every day | ORAL | Status: DC

## 2011-02-20 MED ORDER — HYDROMORPHONE 0.3 MG/ML IV SOLN
INTRAVENOUS | Status: AC
Start: 2011-02-20 — End: 2011-02-20
  Filled 2011-02-20: qty 25

## 2011-02-20 MED ORDER — FAMOTIDINE 20 MG PO TABS
40.0000 mg | ORAL_TABLET | Freq: Every day | ORAL | Status: DC
  Administered 2011-02-21: 40 mg via ORAL
  Filled 2011-02-20 (×2): qty 1

## 2011-02-20 MED ORDER — SODIUM CHLORIDE 0.9 % IR SOLN
Freq: Once | Status: DC
  Filled 2011-02-20: qty 500000

## 2011-02-20 MED ORDER — HYDROMORPHONE HCL 1 MG/ML IJ SOLN
INTRAMUSCULAR | Status: AC
  Administered 2011-02-20: 0.5 mg via INTRAVENOUS
  Filled 2011-02-20: qty 1

## 2011-02-20 MED ORDER — FENTANYL CITRATE 0.05 MG/ML IJ SOLN: 1.0000 ug/kg | INTRAMUSCULAR | Status: DC | PRN

## 2011-02-20 MED ORDER — HYDROMORPHONE HCL 1 MG/ML IJ SOLN
INTRAMUSCULAR | Status: AC
  Filled 2011-02-20: qty 1

## 2011-02-20 MED ORDER — ONDANSETRON HCL 4 MG/2ML IJ SOLN: 4.0000 mg | Freq: Once | INTRAMUSCULAR | Status: DC | PRN

## 2011-02-20 MED ORDER — KETOROLAC TROMETHAMINE 30 MG/ML IJ SOLN: 30.0000 mg | Freq: Once | INTRAMUSCULAR | Status: DC

## 2011-02-20 MED ORDER — CEFAZOLIN SODIUM 1-5 GM-% IV SOLN
INTRAVENOUS | Status: AC
  Administered 2011-02-20 (×2): 1 g via INTRAVENOUS
  Filled 2011-02-20: qty 50

## 2011-02-20 MED ORDER — PANTOPRAZOLE SODIUM 40 MG PO TBEC
DELAYED_RELEASE_TABLET | ORAL | Status: AC
  Administered 2011-02-20: 40 mg via ORAL
  Filled 2011-02-20: qty 1

## 2011-02-20 MED ORDER — CETIRIZINE HCL 10 MG PO TABS
10.0000 mg | ORAL_TABLET | Freq: Every day | ORAL | Status: DC
  Administered 2011-02-20 – 2011-02-21 (×2): 10 mg via ORAL

## 2011-02-20 MED ORDER — TOPIRAMATE 25 MG PO TABS
50.0000 mg | ORAL_TABLET | Freq: Two times a day (BID) | ORAL | Status: DC
  Administered 2011-02-20 – 2011-02-21 (×3): 50 mg via ORAL
  Filled 2011-02-20 (×4): qty 2

## 2011-02-20 MED ORDER — ROCURONIUM BROMIDE 50 MG/5ML IV SOLN
INTRAVENOUS | Status: AC
  Filled 2011-02-20: qty 1

## 2011-02-20 MED ORDER — FENTANYL CITRATE 0.05 MG/ML IJ SOLN
INTRAMUSCULAR | Status: DC | PRN
  Administered 2011-02-20: 100 ug via INTRAVENOUS
  Administered 2011-02-20 (×3): 50 ug via INTRAVENOUS

## 2011-02-20 MED ORDER — ONDANSETRON HCL 4 MG/2ML IJ SOLN
INTRAMUSCULAR | Status: DC | PRN
  Administered 2011-02-20: 4 mg via INTRAVENOUS

## 2011-02-20 MED ORDER — MENTHOL 3 MG MT LOZG: 1.0000 | LOZENGE | OROMUCOSAL | Status: DC | PRN

## 2011-02-20 MED ORDER — HYDROXYZINE HCL 50 MG PO TABS
50.0000 mg | ORAL_TABLET | Freq: Every day | ORAL | Status: DC
  Administered 2011-02-20 – 2011-02-21 (×2): 50 mg via ORAL
  Filled 2011-02-20 (×3): qty 1

## 2011-02-20 MED ORDER — PROPOFOL 10 MG/ML IV EMUL
INTRAVENOUS | Status: DC | PRN
  Administered 2011-02-20: 150 mg via INTRAVENOUS

## 2011-02-20 MED ORDER — GLYCOPYRROLATE 0.2 MG/ML IJ SOLN
INTRAMUSCULAR | Status: DC | PRN
  Administered 2011-02-20: 0.2 mg via INTRAVENOUS
  Administered 2011-02-20: .4 mg via INTRAVENOUS

## 2011-02-20 MED ORDER — LACTATED RINGERS IV SOLN
INTRAVENOUS | Status: DC
  Administered 2011-02-20 (×3): via INTRAVENOUS

## 2011-02-20 MED ORDER — METOCLOPRAMIDE HCL 10 MG PO TABS
10.0000 mg | ORAL_TABLET | Freq: Once | ORAL | Status: AC
  Administered 2011-02-20: 10 mg via ORAL
  Filled 2011-02-20: qty 1

## 2011-02-20 MED ORDER — NEOSTIGMINE METHYLSULFATE 1 MG/ML IJ SOLN
INTRAMUSCULAR | Status: DC | PRN
  Administered 2011-02-20: 2 mg via INTRAVENOUS

## 2011-02-20 MED ORDER — BISACODYL 10 MG RE SUPP: 10.0000 mg | Freq: Every day | RECTAL | Status: DC | PRN

## 2011-02-20 MED ORDER — BUPIVACAINE-EPINEPHRINE PF 0.5-1:200000 % IJ SOLN
INTRAMUSCULAR | Status: DC | PRN
  Administered 2011-02-20: 10 mL

## 2011-02-20 MED ORDER — RANITIDINE HCL 150 MG PO TABS
300.0000 mg | ORAL_TABLET | Freq: Every day | ORAL | Status: DC
  Administered 2011-02-20 – 2011-02-21 (×2): 300 mg via ORAL

## 2011-02-20 MED ORDER — VENLAFAXINE HCL ER 75 MG PO CP24
75.0000 mg | ORAL_CAPSULE | Freq: Every day | ORAL | Status: DC
Start: 2011-02-21 — End: 2011-02-22
  Administered 2011-02-21: 75 mg via ORAL
  Filled 2011-02-20 (×2): qty 1

## 2011-02-20 MED ORDER — ACETAMINOPHEN 10 MG/ML IV SOLN
1000.0000 mg | Freq: Four times a day (QID) | INTRAVENOUS | Status: AC
  Administered 2011-02-20 – 2011-02-21 (×3): 1000 mg via INTRAVENOUS
  Filled 2011-02-20 (×2): qty 100

## 2011-02-20 MED ORDER — LEVOTHYROXINE SODIUM 150 MCG PO TABS: 150.0000 ug | ORAL_TABLET | Freq: Every day | ORAL | Status: DC

## 2011-02-20 MED ORDER — HYDROMORPHONE HCL 1 MG/ML IJ SOLN
0.2500 mg | INTRAMUSCULAR | Status: DC | PRN
  Administered 2011-02-20: 0.5 mg via INTRAVENOUS
  Administered 2011-02-20: 0.25 mg via INTRAVENOUS
  Administered 2011-02-20 (×2): 0.5 mg via INTRAVENOUS
  Administered 2011-02-20: 0.25 mg via INTRAVENOUS

## 2011-02-20 MED ORDER — DEXAMETHASONE SODIUM PHOSPHATE 10 MG/ML IJ SOLN
INTRAMUSCULAR | Status: AC
  Filled 2011-02-20: qty 1

## 2011-02-20 MED ORDER — SUCCINYLCHOLINE CHLORIDE 20 MG/ML IJ SOLN
INTRAMUSCULAR | Status: AC
  Filled 2011-02-20: qty 1

## 2011-02-20 MED ORDER — FENTANYL CITRATE 0.05 MG/ML IJ SOLN
INTRAMUSCULAR | Status: AC
  Filled 2011-02-20: qty 5

## 2011-02-20 MED ORDER — INDIGOTINDISULFONATE SODIUM 8 MG/ML IJ SOLN
INTRAMUSCULAR | Status: AC
  Filled 2011-02-20: qty 5

## 2011-02-20 MED ORDER — STUDY - INVESTIGATIONAL DRUG SIMPLE RECORD
5.0000 mg | Freq: Two times a day (BID) | Status: DC
  Administered 2011-02-20 – 2011-02-21 (×3): 5 mg via ORAL
  Filled 2011-02-20: qty 5

## 2011-02-20 MED ORDER — MELOXICAM 15 MG PO TABS: 15.0000 mg | ORAL_TABLET | Freq: Every day | ORAL | Status: DC

## 2011-02-20 MED ORDER — MELOXICAM 15 MG PO TABS
15.0000 mg | ORAL_TABLET | Freq: Every day | ORAL | Status: DC
  Administered 2011-02-21: 15 mg via ORAL
  Filled 2011-02-20 (×2): qty 1

## 2011-02-20 MED ORDER — ONDANSETRON HCL 4 MG/2ML IJ SOLN: 4.0000 mg | Freq: Four times a day (QID) | INTRAMUSCULAR | Status: DC | PRN

## 2011-02-20 MED ORDER — PROPOFOL 10 MG/ML IV EMUL
INTRAVENOUS | Status: AC
  Filled 2011-02-20: qty 20

## 2011-02-20 MED ORDER — TEMAZEPAM 15 MG PO CAPS
15.0000 mg | ORAL_CAPSULE | Freq: Every evening | ORAL | Status: DC | PRN
  Administered 2011-02-22: 30 mg via ORAL
  Filled 2011-02-20 (×2): qty 1

## 2011-02-20 MED ORDER — INDIGOTINDISULFONATE SODIUM 8 MG/ML IJ SOLN
INTRAMUSCULAR | Status: DC | PRN
  Administered 2011-02-20: 5 mL via INTRAVENOUS

## 2011-02-20 MED ORDER — ONDANSETRON HCL 4 MG/2ML IJ SOLN
INTRAMUSCULAR | Status: AC
  Filled 2011-02-20: qty 2

## 2011-02-20 MED ORDER — LEVOTHYROXINE SODIUM 150 MCG PO TABS
150.0000 ug | ORAL_TABLET | Freq: Every day | ORAL | Status: DC
  Administered 2011-02-21 – 2011-02-22 (×2): 150 ug via ORAL
  Filled 2011-02-20 (×2): qty 1

## 2011-02-20 MED ORDER — LIDOCAINE HCL (CARDIAC) 20 MG/ML IV SOLN
INTRAVENOUS | Status: DC | PRN
  Administered 2011-02-20: 60 mg via INTRAVENOUS

## 2011-02-20 MED ORDER — ACETAMINOPHEN 10 MG/ML IV SOLN
INTRAVENOUS | Status: DC | PRN
  Administered 2011-02-20: 1000 mg via INTRAVENOUS

## 2011-02-20 MED ORDER — PANTOPRAZOLE SODIUM 40 MG PO TBEC
40.0000 mg | DELAYED_RELEASE_TABLET | Freq: Once | ORAL | Status: AC
  Administered 2011-02-20: 40 mg via ORAL

## 2011-02-20 MED ORDER — METOCLOPRAMIDE HCL 10 MG PO TABS
10.0000 mg | ORAL_TABLET | Freq: Once | ORAL | Status: AC
  Administered 2011-02-20: 10 mg via ORAL

## 2011-02-20 MED ORDER — MIDAZOLAM HCL 2 MG/2ML IJ SOLN
INTRAMUSCULAR | Status: AC
  Filled 2011-02-20: qty 2

## 2011-02-20 MED ORDER — CEFAZOLIN SODIUM 1-5 GM-% IV SOLN
INTRAVENOUS | Status: AC
  Filled 2011-02-20: qty 50

## 2011-02-20 MED ORDER — MONTELUKAST SODIUM 10 MG PO TABS
10.0000 mg | ORAL_TABLET | Freq: Every day | ORAL | Status: DC
  Administered 2011-02-20 – 2011-02-21 (×2): 10 mg via ORAL
  Filled 2011-02-20 (×2): qty 1

## 2011-02-20 MED ORDER — OXYCODONE-ACETAMINOPHEN 5-325 MG PO TABS
1.0000 | ORAL_TABLET | ORAL | Status: DC | PRN
  Administered 2011-02-21 – 2011-02-22 (×5): 2 via ORAL
  Filled 2011-02-20 (×5): qty 2

## 2011-02-20 MED ORDER — ROCURONIUM BROMIDE 100 MG/10ML IV SOLN
INTRAVENOUS | Status: DC | PRN
  Administered 2011-02-20: 10 mg via INTRAVENOUS
  Administered 2011-02-20: 40 mg via INTRAVENOUS

## 2011-02-20 MED ORDER — DEXAMETHASONE SODIUM PHOSPHATE 10 MG/ML IJ SOLN
INTRAMUSCULAR | Status: DC | PRN
  Administered 2011-02-20: 10 mg via INTRAVENOUS

## 2011-02-20 MED ORDER — SUCCINYLCHOLINE CHLORIDE 20 MG/ML IJ SOLN
INTRAMUSCULAR | Status: DC | PRN
  Administered 2011-02-20: 100 mg via INTRAVENOUS

## 2011-02-20 MED ORDER — KETOROLAC TROMETHAMINE 30 MG/ML IJ SOLN: 15.0000 mg | Freq: Once | INTRAMUSCULAR | Status: DC | PRN

## 2011-02-20 MED ORDER — SCOPOLAMINE 1 MG/3DAYS TD PT72
MEDICATED_PATCH | TRANSDERMAL | Status: AC
  Administered 2011-02-20: 1.5 mg via TRANSDERMAL
  Filled 2011-02-20: qty 1

## 2011-02-20 MED ORDER — SCOPOLAMINE 1 MG/3DAYS TD PT72
1.0000 | MEDICATED_PATCH | Freq: Once | TRANSDERMAL | Status: DC
  Administered 2011-02-20: 1.5 mg via TRANSDERMAL

## 2011-02-20 MED ORDER — ACETAMINOPHEN 10 MG/ML IV SOLN
1000.0000 mg | Freq: Four times a day (QID) | INTRAVENOUS | Status: DC
  Filled 2011-02-20 (×3): qty 100

## 2011-02-20 MED ORDER — SODIUM CHLORIDE 0.9 % IJ SOLN: 9.0000 mL | INTRAMUSCULAR | Status: DC | PRN

## 2011-02-20 MED ORDER — NEOSTIGMINE METHYLSULFATE 1 MG/ML IJ SOLN
INTRAMUSCULAR | Status: AC
  Filled 2011-02-20: qty 10

## 2011-02-20 MED ORDER — METOCLOPRAMIDE HCL 10 MG PO TABS
ORAL_TABLET | ORAL | Status: AC
  Administered 2011-02-20: 10 mg via ORAL
  Filled 2011-02-20: qty 1

## 2011-02-20 MED ORDER — MIDAZOLAM HCL 5 MG/5ML IJ SOLN
INTRAMUSCULAR | Status: DC | PRN
  Administered 2011-02-20: 2 mg via INTRAVENOUS

## 2011-02-20 SURGICAL SUPPLY — 53 items
ADH SKN CLS APL DERMABOND .7 (GAUZE/BANDAGES/DRESSINGS) ×2
BARRIER ADHS 3X4 INTERCEED (GAUZE/BANDAGES/DRESSINGS) IMPLANT
BLADE SURG 10 STRL SS (BLADE) ×3 IMPLANT
BLADE SURG 15 STRL LF C SS BP (BLADE) ×2 IMPLANT
BLADE SURG 15 STRL SS (BLADE) ×3
BRR ADH 4X3 ABS CNTRL BYND (GAUZE/BANDAGES/DRESSINGS)
CANISTER SUCTION 2500CC (MISCELLANEOUS) ×6 IMPLANT
CATH FOLEY 2WAY SLVR  5CC 16FR (CATHETERS) ×1
CATH FOLEY 2WAY SLVR 5CC 16FR (CATHETERS) ×2 IMPLANT
CLOTH BEACON ORANGE TIMEOUT ST (SAFETY) ×3 IMPLANT
DERMABOND ADVANCED (GAUZE/BANDAGES/DRESSINGS) ×1
DERMABOND ADVANCED .7 DNX12 (GAUZE/BANDAGES/DRESSINGS) ×2 IMPLANT
DRAIN PENROSE 1/2X12 (DRAIN) IMPLANT
DRAPE CAMERA CLOSED 9X96 (DRAPES) IMPLANT
DRAPE HYSTEROSCOPY (DRAPE) IMPLANT
DRAPE UTILITY XL STRL (DRAPES) ×3 IMPLANT
DRESSING TELFA 8X3 (GAUZE/BANDAGES/DRESSINGS) ×1 IMPLANT
GAUZE PACKING 2X5 YD STERILE (GAUZE/BANDAGES/DRESSINGS) ×1 IMPLANT
GLOVE BIO SURGEON STRL SZ7.5 (GLOVE) ×3 IMPLANT
GLOVE BIOGEL PI IND STRL 7.5 (GLOVE) ×4 IMPLANT
GLOVE BIOGEL PI INDICATOR 7.5 (GLOVE) ×2
GLOVE ECLIPSE 7.0 STRL STRAW (GLOVE) ×3 IMPLANT
GOWN PREVENTION PLUS LG XLONG (DISPOSABLE) ×15 IMPLANT
NEEDLE HYPO 22GX1.5 SAFETY (NEEDLE) ×3 IMPLANT
NS IRRIG 1000ML POUR BTL (IV SOLUTION) ×3 IMPLANT
PACK ABDOMINAL GYN (CUSTOM PROCEDURE TRAY) ×3 IMPLANT
PACK VAGINAL WOMENS (CUSTOM PROCEDURE TRAY) IMPLANT
PAD ABD 7.5X8 STRL (GAUZE/BANDAGES/DRESSINGS) ×2 IMPLANT
PAD OB MATERNITY 4.3X12.25 (PERSONAL CARE ITEMS) ×3 IMPLANT
PENCIL BUTTON HOLSTER BLD 10FT (ELECTRODE) ×3 IMPLANT
PLUG CATH AND CAP STER (CATHETERS) ×4 IMPLANT
RETRACTOR STAY HOOK 5MM (MISCELLANEOUS) IMPLANT
SLING LYNX SUPRAPUBIC (Sling) ×2 IMPLANT
SPONGE GAUZE 4X4 12PLY (GAUZE/BANDAGES/DRESSINGS) ×1 IMPLANT
SPONGE LAP 18X18 X RAY DECT (DISPOSABLE) IMPLANT
STAPLER VISISTAT 35W (STAPLE) IMPLANT
SUT CHROMIC 1 TIES 18 (SUTURE) ×3 IMPLANT
SUT CHROMIC 1MO 4 18 CR8 (SUTURE) ×9 IMPLANT
SUT CHROMIC 2 0 SH (SUTURE) IMPLANT
SUT CHROMIC 2 0 TIES 18 (SUTURE) ×1 IMPLANT
SUT CHROMIC 3 0 SH 27 (SUTURE) IMPLANT
SUT PLAIN 3 0 FS2 (SUTURE) IMPLANT
SUT VIC AB 0 CT1 36 (SUTURE) ×8 IMPLANT
SUT VIC AB 2-0 UR6 27 (SUTURE) ×1 IMPLANT
SUT VIC AB 3-0 SH 27 (SUTURE)
SUT VIC AB 3-0 SH 27X BRD (SUTURE) IMPLANT
SYR BULB IRRIGATION 50ML (SYRINGE) ×3 IMPLANT
TOWEL OR 17X24 6PK STRL BLUE (TOWEL DISPOSABLE) ×12 IMPLANT
TRAY FOLEY CATH 14FR (SET/KITS/TRAYS/PACK) ×3 IMPLANT
TRAY FOLEY CATH 16FR SILVER (SET/KITS/TRAYS/PACK) ×1 IMPLANT
TUBING NON-CON 1/4 X 20 CONN (TUBING) ×3 IMPLANT
WATER STERILE IRR 1000ML POUR (IV SOLUTION) ×6 IMPLANT
YANKAUER SUCT BULB TIP NO VENT (SUCTIONS) ×3 IMPLANT

## 2011-02-20 NOTE — Transfer of Care (Signed)
Immediate Anesthesia Transfer of Care Note  Patient: Gabrielle Lynch  Procedure(s) Performed:  HYSTERECTOMY ABDOMINAL; SALPINGO OOPHERECTOMY; PUBO-VAGINAL SLING  Patient Location: PACU  Anesthesia Type: General  Level of Consciousness: alert , oriented and sedated  Airway & Oxygen Therapy: Patient Spontanous Breathing and Patient connected to nasal cannula oxygen  Post-op Assessment: Report given to PACU RN and Post -op Vital signs reviewed and stable  Post vital signs: stable  Complications: No apparent anesthesia complications

## 2011-02-20 NOTE — Op Note (Addendum)
Operative note.  Preoperative diagnoses: Persistent postmenopausal bleeding. Multiple fibroids. Postoperative diagnosis: Same Operation: Total abdominall hysterectomy, bilateral salpingo-oophorectomy.  Surgeon: Dr. Eda Paschal First Asst.: Dr. Lily Peer  Anesthesia: Gen. Endotracheal Findings: Patient's uterus was enlarged to 3 times normal size by multiple fibroids. Ovaries and fallopian tubes were normal and consistent with a postmenopausal state. Patient's tissues were very vascular and she bled very freely.  Procedure: After adequate general endotracheal anesthesia the patient was placed in a modified dorsal lithotomy position. This position was utilized because she was going to undergo a urological procedure following her hysterectomy. A Foley catheter was inserted in her bladder after she had been prepped and draped in the usual sterile manner. A Pfannenstiel incision was made. The fascia was opened transversely. The peritoneum was entered vertically. There was a significant amount of bleeding which was controlled with the Bovie and individual suture ligatures. Once the peritoneal cavity was opened multiple fibroids were encountered. The round ligaments were sutured and cut. The vesicouterine fold of peritoneum was  sharply dissected free. The ureters were bilaterally identified. The infundibular pelvic ligaments were then clamped,  cut and suture ligated. The bladder flap was advanced with sharp dissection. The uterine arteries were then clamped cut and suture ligated. A supracervical hysterectomy was then performed to allow better access to the cervix. The parametrium was then taken down in successive bites. The parametrium was clamped cut and suture ligated. The cervical vaginal junction was identified and trimmed around. The cervix was delivered and sent to pathology with the fundus, fibroids, and both ovaries and fallopian tubes. Suture material for all the above mentioned pedicles was #1 chromic  catgut. The patient's vaginal cuff was then sutured. The angles were sutured with #1 chromic catgut incorporating uterosacral ligaments and parametrium for good vault support. The cuff itself was closed with #1 Vicryl. Several areas of bleeding were controlled with the Bovie. Copious irrigation was done with sterile saline. There was no bleeding noted. 2 sponge needle and instrument counts were correct. The fascia was closed with 2 running 0 Vicryl sutures. The skin was closed with a subcuticular suture. Steri-Strips were applied as well as a pressure dressing. Estimated blood loss was 200 cc with none replaced. At the termination of the procedure the patient was draining clear urine from her Foley catheter. Following this a sling procedure was performed and dictated by Dr. Patsi Sears.

## 2011-02-20 NOTE — OR Nursing (Signed)
Dr. Eda Paschal stuck with a suture needle 1Chromic Gut MO-4 smallest finger on left hand, appropriate measures taken, charge nurse notiifed, MD number taken for contact

## 2011-02-20 NOTE — Anesthesia Postprocedure Evaluation (Signed)
  Anesthesia Post-op Note  Patient: Gabrielle Lynch  Procedure(s) Performed:  HYSTERECTOMY ABDOMINAL; SALPINGO OOPHERECTOMY; PUBO-VAGINAL SLING  Patient Location: PACU  Anesthesia Type: General  Level of Consciousness: awake, alert  and oriented  Airway and Oxygen Therapy: Patient Spontanous Breathing  Post-op Pain: none  Post-op Assessment: Post-op Vital signs reviewed and Patient's Cardiovascular Status Stable  Post-op Vital Signs: Reviewed and stable  Complications: No apparent anesthesia complications

## 2011-02-20 NOTE — Anesthesia Preprocedure Evaluation (Addendum)
Anesthesia Evaluation  Patient identified by MRN, date of birth, ID band Patient awake  General Assessment Comment  Reviewed: Allergy & Precautions, H&P , Patient's Chart, lab work & pertinent test results, reviewed documented beta blocker date and time   Airway Mallampati: III TM Distance: >3 FB Neck ROM: full    Dental No notable dental hx. (+) Caps   Pulmonary  clear to auscultation  Pulmonary exam normal       Cardiovascular Hypertension: hx of htn, slight incr DBP, regular Normal    Neuro/Psych    GI/Hepatic   Endo/Other    Renal/GU      Musculoskeletal   Abdominal   Peds  Hematology   Anesthesia Other Findings   Reproductive/Obstetrics                         Anesthesia Physical Anesthesia Plan  ASA: II  Anesthesia Plan: General   Post-op Pain Management:    Induction: Intravenous  Airway Management Planned: Oral ETT  Additional Equipment:   Intra-op Plan:   Post-operative Plan:   Informed Consent: I have reviewed the patients History and Physical, chart, labs and discussed the procedure including the risks, benefits and alternatives for the proposed anesthesia with the patient or authorized representative who has indicated his/her understanding and acceptance.   Dental Advisory Given  Plan Discussed with: CRNA and Surgeon  Anesthesia Plan Comments: (  Discussed  general anesthesia, including possible nausea, instrumentation of airway, sore throat,pulmonary aspiration, etc. I asked if the were any outstanding questions, or  concerns before we proceeded. )       Anesthesia Quick Evaluation

## 2011-02-20 NOTE — Addendum Note (Signed)
Addendum  created 02/20/11 1821 by Madison Hickman   Modules edited:Notes Section

## 2011-02-20 NOTE — Op Note (Signed)
Gabrielle Lynch, Gabrielle Lynch               ACCOUNT NO.:  [id number]  MEDICAL RECORD NO.:  [id number]  LOCATION:  9319                          FACILITY:  [facility]  PHYSICIAN:  Zair Borawski I. Patsi Sears, M.D.DATE OF BIRTH:  11/24/1950  DATE OF PROCEDURE:  02/20/2011 DATE OF DISCHARGE:                              OPERATIVE REPORT   PREOPERATIVE DIAGNOSIS:  Stress urinary incontinence.  POSTOPERATIVE DIAGNOSIS:  Stress urinary incontinence.  OPERATION:  [employer], pubovaginal mid urethral sling.  SURGEON:  Altin Sease I. Patsi Sears, MD  ANESTHESIA:  General endotracheal.  PREPARATION:  After the patient underwent abdominal hysterectomy per Dr. Eda Paschal, the pubis was prepped with Betadine solution and draped in usual fashion.  REVIEW OF HISTORY:  This 60 year old female was evaluated for cough, laugh, sneeze, incontinence, with some frequency and nocturia.  She is for coordinated TAH-BSO per Dr. Eda Paschal, and lynx pubovaginal sling for repair of her urethral hypermobility.  Her physical examination showed urethral hypermobility, but no cystocele, enterocele or rectocele.  PROCEDURE:  Vaginal inspection under anesthesia, again reveals urethral hypermobility, but no evidence of cystocele, enterocele or rectocele formation.  The mid urethra was marked after Foley catheter placed, and the Foley balloon is marked for the bladder neck with a blue marking pen.  Areas of incision suprapubically are marked with a blue marking pen, 2 fingerbreadths above the pubic tubercle, and 2 fingerbreadths lateral to the pubic tubercle bilaterally.  Using a fresh 10 blade, incision was made suprapubically.  Using a fresh 15 blade, a 2-cm mid urethral incision was made, after hydrodissection with Marcaine 0.5% with epinephrine 1:200,000 was injected. Subcutaneous tissue was dissected, bilaterally, with care taken to avoid injury to the urethra.  The [place] needles were then placed bilaterally,  retropubically.  With the [place] needle in place, cystoscopy was accomplished, after indigo carmine given, and blue dye was seen from both ureteral orifices.  No evidence of bladder intrusion by the needles was noticed.  The Foley catheter was replaced, and the lynx sling was placed.  The wings of the sling are then brought into the retropubic space with a large clamp behind the sling for tensioning.  The midline plastic cap was removed, and the plastic sleeves are removed.  Again, with large clamp behind the sling, the wings of the mesh arms are cut individually, subcutaneously.  Minimal bleeding was noted.  There was a nice smooth sling around the urethra, with no tension.  The wound was irrigated with antibiotic irrigation, and closed with 2-0 Vicryl running suture.  Dermabond was used to close the suprapubic incisions.  Vaginal packing was placed with Estrace and vaginal packing.  The patient was then awakened and taken to recovery room in good condition.     [name] I. Patsi Sears, M.D.     SIT/MEDQ  D:  02/20/2011  T:  02/20/2011  Job:  [id number]  cc:   Reuel Boom L. Eda Paschal, M.D. Fax: [phone]

## 2011-02-20 NOTE — Progress Notes (Signed)
Pharmacy notified of verbal order from Dr. Eda Paschal stating okay for pt to take home meds([place] medication).  Victorino Dike from pharmacy aware.  Still trying to get med approved.

## 2011-02-20 NOTE — OR Nursing (Signed)
2nd procedure ended 1044

## 2011-02-20 NOTE — Anesthesia Postprocedure Evaluation (Signed)
Anesthesia Post Note  Patient: Gabrielle Lynch  Procedure(s) Performed:  HYSTERECTOMY ABDOMINAL; SALPINGO OOPHERECTOMY; PUBO-VAGINAL SLING  Anesthesia type: General  Patient location: PACU  Post pain: Pain level controlled  Post assessment: Post-op Vital signs reviewed  Last Vitals:  Filed Vitals:   02/20/11 1130  BP: 150/84  Pulse: 94  Temp:   Resp: 14    Post vital signs: Reviewed  Level of consciousness: sedated  Complications: No apparent anesthesia complicationsfj

## 2011-02-20 NOTE — OR Nursing (Signed)
Dr. Patsi Sears arrived-0932 2nd Procedure start-0954 End-1032 MD out of room-1032

## 2011-02-21 ENCOUNTER — Encounter (HOSPITAL_COMMUNITY): Payer: Self-pay | Admitting: Obstetrics and Gynecology

## 2011-02-21 LAB — CBC
HCT: 30.3 % — ABNORMAL LOW (ref 36.0–46.0)
Hemoglobin: 9.9 g/dL — ABNORMAL LOW (ref 12.0–15.0)
MCH: 30.7 pg (ref 26.0–34.0)
MCHC: 32.7 g/dL (ref 30.0–36.0)

## 2011-02-21 NOTE — Progress Notes (Signed)
UR Chart review completed.  

## 2011-02-21 NOTE — Progress Notes (Addendum)
  Postoperative day one:  Patient is afebrile. She is having no nausea and vomiting. Her input and output was excellent. When I saw her her last night she was complaining of her ears feeling congested and a slight suggestion that her throat was closing. She did not have a sore throat. Due to her mass cell disorder I had some concerns about these complaints. I discussed with Dr. Cristela Blue. He went to see her and felt that she was fine. We adjusted some of her medications that she takes at home for her mass cell disorder. We had used alternatives because of formulary. However we got the pharmacy to allow her to take her own medications. This morning her complaints regarding her ears and throat were completely gone.  Physical exam: Her abdomen is soft without guarding or rebound. Bowel sounds are present. Urine is clear.  Assessment: Normal postoperative course.  Plan: I removed her pack. It just had slight amount of blood on it. I could not feel her vaginal ring. We had asked her  to remove it before she came to the hospital but she forgot. I suspect Dr. Patsi Sears removed it when he be put in her sling. I will check with him today. I checked with Dr. Patsi Sears and he said there was no ring in the vagina.

## 2011-02-22 MED ORDER — STUDY - INVESTIGATIONAL DRUG SIMPLE RECORD: 5.0000 mg | Freq: Two times a day (BID) | Status: DC

## 2011-02-22 MED ORDER — VENLAFAXINE HCL ER 75 MG PO CP24: 150.0000 mg | ORAL_CAPSULE | Freq: Every day | ORAL | Status: DC

## 2011-02-22 MED ORDER — TEMAZEPAM 15 MG PO CAPS: 30.0000 mg | ORAL_CAPSULE | Freq: Every evening | ORAL | Status: DC | PRN

## 2011-02-22 MED ORDER — OXYCODONE-ACETAMINOPHEN 5-325 MG PO TABS: 1.0000 | ORAL_TABLET | ORAL | Status: AC | PRN

## 2011-02-22 MED ORDER — LEVOTHYROXINE SODIUM 125 MCG PO TABS: ORAL_TABLET | ORAL | Status: DC

## 2011-02-22 MED ORDER — ESTRADIOL ACETATE 0.05 MG/24HR VA RING: VAGINAL_RING | VAGINAL | Status: DC

## 2011-02-22 NOTE — Discharge Summary (Signed)
  Discharge summary: Date of admission: 02/20/2011 Date of discharge 02/22/2011  Patient was admitted to the hospital with symptomatic fibroids, urinary stress incontinence. Her symptoms included significant pelvic pressure and recurrent irregular postmenopausal bleeding. On the day of admission she was taken to the operating room. She underwent a laparotomy. Findings were very large fibroids. She underwent a total abdominal  hysterectomy and bilateral salpingo-oophorectomy. It was not felt due to the large size of the fibroids and their lateral extent that the surgery could been done to a minimally invasive technique. It was a surgeon's impression at the time of surgery that the correct approach was taken. Following the above surgery Dr. Patsi Sears did a retropubic sling procedure. Patient's postoperative course continued uneventfully and she was discharged on the second postoperative day.  Laboratory data: Preoperative CBC was within normal limits. Postoperative CBC showed a slightly low hemoglobin 9.9 and a white blood cell count of 16,700.  Final pathology report: Multiple fibroids and endometriosis of right ovary.  Condition on discharge: Improved  Discharge medications and instructions: see epic record.  Discharge diagnosis: Symptomatic fibroids. Endometriosis. Persistent postmenopausal bleeding. Urinary stress incontinence.

## 2011-02-22 NOTE — Progress Notes (Signed)
Patient called and notified that home meds were forgotten to be returned to her at discharge, she stated she would come by to pick them up. Labeled meds and placed in top drawer of med cart.

## 2011-02-24 ENCOUNTER — Telehealth: Payer: Self-pay | Admitting: *Deleted

## 2011-02-24 NOTE — Telephone Encounter (Signed)
Patient called c/o a little bit of hot flashes, thinks maybe from just having the surgery.  Will watch for now and let us know next week if things are not better, we will send a note back to Dr. Eda Paschal.

## 2011-02-27 ENCOUNTER — Encounter: Payer: Self-pay | Admitting: Obstetrics and Gynecology

## 2011-03-06 ENCOUNTER — Ambulatory Visit (INDEPENDENT_AMBULATORY_CARE_PROVIDER_SITE_OTHER): Payer: BC Managed Care – PPO | Admitting: Obstetrics and Gynecology

## 2011-03-06 DIAGNOSIS — D259 Leiomyoma of uterus, unspecified: Secondary | ICD-10-CM

## 2011-03-06 DIAGNOSIS — D219 Benign neoplasm of connective and other soft tissue, unspecified: Secondary | ICD-10-CM

## 2011-03-06 DIAGNOSIS — N809 Endometriosis, unspecified: Secondary | ICD-10-CM

## 2011-03-06 NOTE — Progress Notes (Signed)
Patient came back to see me today for postoperative visit. She is doing well without any problems. She is voiding without difficulty. She has already been discharged by Dr. Patsi Sears. She is having a lot of night sweats.  Her abdomen is soft. Her incision is healing well. There's been a slight separation on the right side it appears to be granulating easily. There is no sign of cellulitis or infection. She does have a lot of bruising under the skin which I believe is due to her medication.  Cuff: Healing well.  Assessment: Normal postoperative course  Plan: Vivelle dot patch 0.05 mg twice a week for 3 weeks and then she will go back to her vaginal ring. Restoril 30 mg tablets #30 with 3 refills. RTO 4 weeks.

## 2011-03-14 ENCOUNTER — Encounter: Payer: Self-pay | Admitting: Gynecology

## 2011-03-14 ENCOUNTER — Ambulatory Visit (INDEPENDENT_AMBULATORY_CARE_PROVIDER_SITE_OTHER): Payer: BC Managed Care – PPO | Admitting: Gynecology

## 2011-03-14 VITALS — BP 128/82

## 2011-03-14 DIAGNOSIS — T8140XA Infection following a procedure, unspecified, initial encounter: Secondary | ICD-10-CM

## 2011-03-14 DIAGNOSIS — T8149XA Infection following a procedure, other surgical site, initial encounter: Secondary | ICD-10-CM

## 2011-03-14 MED ORDER — FLUCONAZOLE 150 MG PO TABS: 150.0000 mg | ORAL_TABLET | Freq: Once | ORAL | Status: AC

## 2011-03-14 MED ORDER — DICLOXACILLIN SODIUM 250 MG PO CAPS: 500.0000 mg | ORAL_CAPSULE | Freq: Two times a day (BID) | ORAL | Status: AC

## 2011-03-14 NOTE — Progress Notes (Signed)
Patient presented to the office today with concerns of a softball area on the lateral aspect of her right incision that had separated. She was here 5 days ago for a postop visit with doing well and was very minimal but she states it has been low but wider. She denies any fever chills nausea vomiting her an abdominal distention. She is status post abdominal hysterectomy with bilateral salpingo-oophorectomy followed by concurrent operation by Dr. Patsi Sears who had performed a [place] pubovaginal sling for the repair of urethral hypermobility due to patient's stress urinary incontinence. Date of operation was 02/20/2011.  Exam: Abdomen the right lateral aspect of the Pfannenstiel incision demonstrated superficial separation for approximately 2 cm. Fibrinous/granulation tissue was present and the borders with mild erythema. The area was debrided with hydrogen peroxide the fibrinous material was excised. Was very superficial. Neosporin was applied. Patient will be placed on dicloxacillin 500 mg twice a day for 10 days. She was given a prescription of Diflucan 150 mg one by mouth in the event she develops a yeast infections and she's going out of town. She will apply Neosporin in the evening after debriding or showering in the evening with antibacterial soap. Patient to call the office of the separation continues to expand or she develops any fever or any problems. She was reassured and we'll follow accordingly. She will follow with  for routine postop visit with Dr. Eda Paschal

## 2011-03-22 ENCOUNTER — Ambulatory Visit (INDEPENDENT_AMBULATORY_CARE_PROVIDER_SITE_OTHER): Payer: BC Managed Care – PPO | Admitting: Surgery

## 2011-03-22 ENCOUNTER — Encounter (INDEPENDENT_AMBULATORY_CARE_PROVIDER_SITE_OTHER): Payer: Self-pay | Admitting: Surgery

## 2011-03-22 VITALS — BP 136/88 | HR 64 | Temp 97.2°F | Resp 16 | Ht 65.0 in | Wt 181.4 lb

## 2011-03-22 DIAGNOSIS — K439 Ventral hernia without obstruction or gangrene: Secondary | ICD-10-CM

## 2011-03-22 NOTE — Progress Notes (Signed)
Gabrielle Lynch comes in today after her pelvic surgery and bladder sling done by Drs. Gottensegen and Tannenbaum.  She didn't need a band fill.  Above her Pffanenstiel incision and just below her umbilicus, she has a 3 finger breath hernia that she is noticing. The hernia bulges more with standing.  She will show this to [facility] when she sees him next week  She may need to have this repaired before she goes to [place] in May.  We may be looking at lap assisted ventral hernia repair in Jan/Feb.  Her main problem is this Mast cell disease.  She wants [place] get this [place]    Impression:  Midline ventral hernia.  Will see in early 2013 and repair before she goes to [place].

## 2011-03-30 ENCOUNTER — Ambulatory Visit (INDEPENDENT_AMBULATORY_CARE_PROVIDER_SITE_OTHER): Payer: BC Managed Care – PPO | Admitting: Obstetrics and Gynecology

## 2011-03-30 DIAGNOSIS — K469 Unspecified abdominal hernia without obstruction or gangrene: Secondary | ICD-10-CM

## 2011-03-30 NOTE — Progress Notes (Signed)
The patient came back for postoperative check. She is having no problems from her surgery. She has noticed however a large bulge at her umbilicus. She thinks it might of been present before the surgery but no where near this size. She had appointment to see Dr. Daphine Deutscher and he felt she had a ventral hernia. It is bothering her and she wants to have it fixed. Other than that she is doing well.  Physical exam:Kim Gardner present.  Abdomen: Large mass of 3-4 cm at umbilicus consistent with hernia. Pelvic: External and vaginal within normal limits. Vaginal cuff healed. Bimanual exam negative.  Assessment: Normal postoperative course. Ventral hernia.  Plan: Reinitiate Femring. Hernia care as per Dr. Daphine Deutscher.

## 2011-03-31 ENCOUNTER — Other Ambulatory Visit: Payer: Self-pay | Admitting: *Deleted

## 2011-03-31 MED ORDER — VENLAFAXINE HCL ER 75 MG PO CP24: 75.0000 mg | ORAL_CAPSULE | Freq: Every day | ORAL | Status: DC

## 2011-03-31 MED ORDER — EFFEXOR XR 75 MG PO CP24: 75.0000 mg | ORAL_CAPSULE | Freq: Every day | ORAL | Status: DC

## 2011-03-31 NOTE — Telephone Encounter (Signed)
Addended by: Valeda Malm L on: 03/31/2011 03:15 PM   Modules accepted: Orders

## 2011-05-15 ENCOUNTER — Telehealth (INDEPENDENT_AMBULATORY_CARE_PROVIDER_SITE_OTHER): Payer: Self-pay | Admitting: General Surgery

## 2011-05-15 ENCOUNTER — Other Ambulatory Visit (INDEPENDENT_AMBULATORY_CARE_PROVIDER_SITE_OTHER): Payer: Self-pay | Admitting: General Surgery

## 2011-05-15 NOTE — Telephone Encounter (Signed)
Albin Felling called from [facility] and stated that they need order in for [name] they are stating that it is not in there. Pt surgery is on 05/24/11. Please call Albin Felling at [phone]

## 2011-05-16 ENCOUNTER — Encounter (HOSPITAL_COMMUNITY): Payer: Self-pay | Admitting: Pharmacy Technician

## 2011-05-16 ENCOUNTER — Other Ambulatory Visit (INDEPENDENT_AMBULATORY_CARE_PROVIDER_SITE_OTHER): Payer: Self-pay | Admitting: General Surgery

## 2011-05-16 ENCOUNTER — Inpatient Hospital Stay (HOSPITAL_COMMUNITY): Admission: RE | Admit: 2011-05-16 | Payer: BC Managed Care – PPO | Source: Ambulatory Visit

## 2011-05-17 ENCOUNTER — Encounter (INDEPENDENT_AMBULATORY_CARE_PROVIDER_SITE_OTHER): Payer: Self-pay | Admitting: Surgery

## 2011-05-17 ENCOUNTER — Ambulatory Visit (INDEPENDENT_AMBULATORY_CARE_PROVIDER_SITE_OTHER): Payer: BC Managed Care – PPO | Admitting: Surgery

## 2011-05-17 VITALS — BP 142/88 | HR 88 | Resp 18 | Ht 65.0 in | Wt 189.0 lb

## 2011-05-17 DIAGNOSIS — K439 Ventral hernia without obstruction or gangrene: Secondary | ICD-10-CM

## 2011-05-17 NOTE — Progress Notes (Signed)
Chief Complaint:  Ventral hernia  History of Present Illness:  Gabrielle Lynch is an 61 y.o. female white female who has an enlarging ventral hernia beneath her umbilicus and slightly to the right.  It has been getting visibly larger and she is scheduled to have this repaired next Wednesday.  I answered her questions regarding repair and mesh placement.    Past Medical History  Diagnosis Date  . Cervical dysplasia     HISTORY OF  . Fibroid   . Hypertension     HISTORY OF HYPERTENSION  . Mast cell disorder diag 1989    on Ketotiphen-mast cell inhibitor-not approved in [place]. Resulting in anaphylactic shock  . PONV (postoperative nausea and vomiting)     Past Surgical History  Procedure Date  . Cervical biopsy  w/ loop electrode excision 1992  . Hysteroscopy 06/1995    HYSTEROSCOPIC MYOMECTOMY  . Hysteroscopy 06/1998    D&C, HYSTEROSCOPY  . Dilation and curettage of uterus 06/1998    D&C, HYSTEROSCOPY  . Cholecystectomy   . Knee surgery     X2  . Laparoscopic gastric banding 2010  . Cesarean section     X2  . Abdominal hysterectomy 02/20/2011    Procedure: HYSTERECTOMY ABDOMINAL;  Surgeon: Trellis Paganini, MD;  Location: [facility];  Service: Gynecology;  Laterality: N/A;  . Salpingoophorectomy 02/20/2011    Procedure: SALPINGO OOPHERECTOMY;  Surgeon: Trellis Paganini, MD;  Location: [facility];  Service: Gynecology;  Laterality: Bilateral;  . Pubovaginal sling 02/20/2011    Procedure: Leonides Grills;  Surgeon: Kathi Ludwig, MD;  Location: [facility];  Service: Urology;  Laterality: N/A;    Current Outpatient Prescriptions  Medication Sig Dispense Refill  . cetirizine (ZYRTEC) 10 MG tablet Take 10 mg by mouth daily.        . Estradiol Acetate (FEMRING) 0.05 MG/24HR RING Insert one in vagina every 90 days  0.9 each  3  . FINASTERIDE PO Take 2.5 mg by mouth daily.       . hydrOXYzine (ATARAX/VISTARIL) 50 MG tablet Take 50 mg by mouth daily.        Marland Kitchen levothyroxine  (SYNTHROID, LEVOTHROID) 150 MCG tablet Take 75-150 mcg by mouth at bedtime. 75 mg weekly on Sunday and 150 mg all other days.      . meloxicam (MOBIC) 15 MG tablet Take 15 mg by mouth daily.        . montelukast (SINGULAIR) 10 MG tablet Take 10 mg by mouth at bedtime.        Marland Kitchen PRESCRIPTION MEDICATION Take 1 tablet by mouth 2 (two) times daily. Ketotifen 5mg  tablet, [place]. For mast cell problem      . ranitidine (ZANTAC) 300 MG tablet Take 300 mg by mouth at bedtime.      . temazepam (RESTORIL) 30 MG capsule Take 30 mg by mouth at bedtime as needed. Sleep       . topiramate (TOPAMAX) 50 MG tablet Take 50 mg by mouth 2 (two) times daily.       Marland Kitchen venlafaxine (EFFEXOR XR) 75 MG 24 hr capsule Take 75 mg by mouth at bedtime.      . Vitamin D, Ergocalciferol, (DRISDOL) 50000 UNITS CAPS Take 50,000 Units by mouth every 7 (seven) days. sundays        Demerol Family History  Problem Relation Age of Onset  . Breast cancer Mother   . Hypertension Mother   . Cancer Father  LUNG   Social History:   reports that she has never smoked. She does not have any smokeless tobacco history on file. She reports that she does not drink alcohol or use illicit drugs.   REVIEW OF SYSTEMS - PERTINENT POSITIVES ONLY: Had amnesic reaction to Ambien before her surgery at [facility]  Physical Exam:   Blood pressure 142/88, pulse 88, resp. rate 18, height 5\' 5"  (1.651 m), weight 189 lb (85.73 kg). Body mass index is 31.45 kg/(m^2).  Gen:  WDWN W F  NAD  Neurological: Alert and oriented to person, place, and time. Motor and sensory function is grossly intact  Head: Normocephalic and atraumatic.  Eyes: Conjunctivae are normal. Pupils are equal, round, and reactive to light. No scleral icterus.  Neck: Normal range of motion. Neck supple. No tracheal deviation or thyromegaly present.  Cardiovascular:  SR without murmurs or gallops.  No carotid bruits Respiratory: Effort normal.  No respiratory  distress. No chest wall tenderness. Breath sounds normal.  No wheezes, rales or rhonchi.  Abdomen:  Prominent bulge in the midline and slightly to the right compatible with ventral hernia GU: Musculoskeletal: Normal range of motion. Extremities are nontender. No cyanosis, edema or clubbing noted Lymphadenopathy: No cervical, preauricular, postauricular or axillary adenopathy is present Skin: Skin is warm and dry. No rash noted. No diaphoresis. No erythema. No pallor. Pscyh: Normal mood and affect. Behavior is normal. Judgment and thought content normal.   LABORATORY RESULTS: No results found for this or any previous visit (from the past 48 hour(s)).  RADIOLOGY RESULTS: No results found.  Problem List: Patient Active Problem List  Diagnoses  . DEGENERATIVE JOINT DISEASE  . SPONDYLOSIS UNSPEC SITE W/O MENTION MYELOPATHY  . SINUS TARSI SYNDROME  . UNEQUAL LEG LENGTH  . Cervical dysplasia  . Fibroid  . Hypertension  . Mast cell disorder    Assessment & Plan: Midline ventral hernia. Plan lap assisted ventral hernia repair with probable anterior mesh and possible intraabdominal mesh.      Matt B. Daphine Deutscher, MD, [facility] [phone] beeper [phone]  05/17/2011 3:04 PM

## 2011-05-19 ENCOUNTER — Other Ambulatory Visit (INDEPENDENT_AMBULATORY_CARE_PROVIDER_SITE_OTHER): Payer: Self-pay | Admitting: Surgery

## 2011-05-19 ENCOUNTER — Other Ambulatory Visit (INDEPENDENT_AMBULATORY_CARE_PROVIDER_SITE_OTHER): Payer: Self-pay | Admitting: General Surgery

## 2011-05-22 ENCOUNTER — Encounter (HOSPITAL_COMMUNITY): Payer: Self-pay | Admitting: Pharmacy Technician

## 2011-05-22 ENCOUNTER — Encounter (HOSPITAL_COMMUNITY): Payer: Self-pay

## 2011-05-22 ENCOUNTER — Encounter (HOSPITAL_COMMUNITY)
Admission: RE | Admit: 2011-05-22 | Discharge: 2011-05-22 | Disposition: A | Discharge status: Home / Self Care | Payer: BC Managed Care – PPO | Source: Ambulatory Visit | Attending: Surgery | Admitting: Surgery

## 2011-05-22 HISTORY — DX: Hypothyroidism, unspecified: E03.9

## 2011-05-22 LAB — BASIC METABOLIC PANEL
BUN: 6 mg/dL (ref 6–23)
Chloride: 102 mEq/L (ref 96–112)
GFR calc Af Amer: >90 mL/min (ref >90)
GFR calc non Af Amer: >90 mL/min (ref >90)
Glucose, Bld: 172 mg/dL — ABNORMAL HIGH (ref 70–99)
Potassium: 4.1 mEq/L (ref 3.5–5.1)
Sodium: 134 mEq/L — ABNORMAL LOW (ref 135–145)

## 2011-05-22 LAB — SURGICAL PCR SCREEN
MRSA, PCR: NEGATIVE
Staphylococcus aureus: NEGATIVE

## 2011-05-22 LAB — CBC
HCT: 37.7 % (ref 36.0–46.0)
Hemoglobin: 12.3 g/dL (ref 12.0–15.0)
RDW: 13 % (ref 11.5–15.5)
WBC: 9.8 10*3/uL (ref 4.0–10.5)

## 2011-05-22 MED ORDER — CHLORHEXIDINE GLUCONATE 4 % EX LIQD: 1.0000 "application " | Freq: Once | CUTANEOUS | Status: DC

## 2011-05-22 NOTE — Pre-Procedure Instructions (Signed)
PT DID NOT NEED CXR OR EKG TODAY PRE OP-PER ANESTHESIA GUIDELINES

## 2011-05-22 NOTE — Patient Instructions (Signed)
20 LORI-ANN LINDFORS  05/22/2011   Your procedure is scheduled on:  WED 1/23  AT 12:00 PM  Report to [facility] at 10:00 AM  Call this number if you have problems the morning of surgery: [phone]   Remember: FLEETS ENEMA NIGHT BEFORE YOUR SURGERY.   Do not eat food OR DRINK ANYTHING AFTER MIDNIGHT THE NIGHT BEFORE YOUR SURGERY.    Take these medicines the morning of surgery with A SIP OF WATER: KETOTIFEN AND TOPIRAMATE   Do not wear jewelry, make-up or nail polish.  Do not wear lotions, powders, or perfumes.  Do not shave 48 hours prior to surgery.  Do not bring valuables to the hospital.  Contacts, dentures or bridgework may not be worn into surgery.  Leave suitcase in the car. After surgery it may be brought to your room.  For patients admitted to the hospital, checkout time is 11:00 AM the day of discharge.   Patients discharged the day of surgery will not be allowed to drive home.    Special Instructions: CHG Shower Use Special Wash: 1/2 bottle night before surgery and 1/2 bottle morning of surgery.   Please read over the following fact sheets that you were given: MRSA Information

## 2011-05-23 ENCOUNTER — Telehealth (INDEPENDENT_AMBULATORY_CARE_PROVIDER_SITE_OTHER): Payer: Self-pay

## 2011-05-23 NOTE — Telephone Encounter (Signed)
Patient states that since Sunday she has had a cold with dry cough and stuffy nose.  She has no fever.  Her surgery is tomorrow and she wants to make sure she can proceed.  I spoke to Dr Daphine Deutscher who said ok.  The pt is aware.

## 2011-05-24 ENCOUNTER — Other Ambulatory Visit (INDEPENDENT_AMBULATORY_CARE_PROVIDER_SITE_OTHER): Payer: Self-pay | Admitting: Surgery

## 2011-05-24 ENCOUNTER — Encounter (HOSPITAL_COMMUNITY): Admission: RE | Payer: Self-pay | Source: Ambulatory Visit

## 2011-05-24 ENCOUNTER — Ambulatory Visit (HOSPITAL_COMMUNITY): Payer: BC Managed Care – PPO | Admitting: Anesthesiology

## 2011-05-24 ENCOUNTER — Encounter (HOSPITAL_COMMUNITY): Payer: Self-pay | Admitting: *Deleted

## 2011-05-24 ENCOUNTER — Ambulatory Visit (HOSPITAL_COMMUNITY)
Admission: RE | Admit: 2011-05-24 | Discharge: 2011-05-25 | Disposition: A | Discharge status: Home / Self Care | Payer: BC Managed Care – PPO | Source: Ambulatory Visit | Attending: Surgery | Admitting: Surgery

## 2011-05-24 ENCOUNTER — Ambulatory Visit (HOSPITAL_COMMUNITY): Admission: RE | Admit: 2011-05-24 | Payer: BC Managed Care – PPO | Source: Ambulatory Visit | Admitting: Surgery

## 2011-05-24 ENCOUNTER — Encounter (HOSPITAL_COMMUNITY): Payer: Self-pay | Admitting: Anesthesiology

## 2011-05-24 ENCOUNTER — Encounter (HOSPITAL_COMMUNITY)
Admission: RE | Disposition: A | Discharge status: Home / Self Care | Payer: Self-pay | Source: Ambulatory Visit | Attending: Surgery

## 2011-05-24 DIAGNOSIS — Z9089 Acquired absence of other organs: Secondary | ICD-10-CM | POA: Insufficient documentation

## 2011-05-24 DIAGNOSIS — I10 Essential (primary) hypertension: Secondary | ICD-10-CM | POA: Insufficient documentation

## 2011-05-24 DIAGNOSIS — K432 Incisional hernia without obstruction or gangrene: Secondary | ICD-10-CM

## 2011-05-24 DIAGNOSIS — M1990 Unspecified osteoarthritis, unspecified site: Secondary | ICD-10-CM

## 2011-05-24 DIAGNOSIS — Z79899 Other long term (current) drug therapy: Secondary | ICD-10-CM | POA: Insufficient documentation

## 2011-05-24 DIAGNOSIS — Z01812 Encounter for preprocedural laboratory examination: Secondary | ICD-10-CM | POA: Insufficient documentation

## 2011-05-24 HISTORY — PX: VENTRAL HERNIA REPAIR: SHX424

## 2011-05-24 LAB — CBC: Hemoglobin: 11.9 g/dL — ABNORMAL LOW (ref 12.0–15.0)

## 2011-05-24 LAB — CREATININE, SERUM: GFR calc Af Amer: >90 mL/min (ref >90)

## 2011-05-24 SURGERY — REPAIR, HERNIA, VENTRAL, LAPAROSCOPIC
Anesthesia: General | Site: Abdomen | Wound class: Clean

## 2011-05-24 SURGERY — REPAIR, HERNIA, VENTRAL, LAPAROSCOPIC
Anesthesia: General

## 2011-05-24 MED ORDER — NEOSTIGMINE METHYLSULFATE 1 MG/ML IJ SOLN
INTRAMUSCULAR | Status: DC | PRN
  Administered 2011-05-24: 4 mg via INTRAVENOUS

## 2011-05-24 MED ORDER — ACETAMINOPHEN 10 MG/ML IV SOLN
INTRAVENOUS | Status: AC
  Filled 2011-05-24: qty 100

## 2011-05-24 MED ORDER — MIDAZOLAM HCL 5 MG/5ML IJ SOLN
INTRAMUSCULAR | Status: DC | PRN
  Administered 2011-05-24: 2 mg via INTRAVENOUS

## 2011-05-24 MED ORDER — MONTELUKAST SODIUM 10 MG PO TABS
10.0000 mg | ORAL_TABLET | Freq: Every day | ORAL | Status: DC
  Administered 2011-05-24: 10 mg via ORAL
  Filled 2011-05-24 (×2): qty 1

## 2011-05-24 MED ORDER — LIDOCAINE HCL (CARDIAC) 20 MG/ML IV SOLN
INTRAVENOUS | Status: DC | PRN
  Administered 2011-05-24: 50 mg via INTRAVENOUS

## 2011-05-24 MED ORDER — MORPHINE SULFATE 2 MG/ML IJ SOLN
0.5000 mg | INTRAMUSCULAR | Status: DC | PRN
  Administered 2011-05-24 – 2011-05-25 (×3): 2 mg via INTRAVENOUS
  Filled 2011-05-24 (×3): qty 1

## 2011-05-24 MED ORDER — HYDROXYZINE HCL 50 MG PO TABS
50.0000 mg | ORAL_TABLET | Freq: Every day | ORAL | Status: DC
Start: 2011-05-24 — End: 2011-05-25
  Administered 2011-05-24: 50 mg via ORAL
  Filled 2011-05-24 (×2): qty 1

## 2011-05-24 MED ORDER — MORPHINE SULFATE 2 MG/ML IJ SOLN
0.5000 mg | INTRAMUSCULAR | Status: DC | PRN
  Administered 2011-05-24: 0.5 mg via INTRAVENOUS
  Filled 2011-05-24: qty 1

## 2011-05-24 MED ORDER — HYDROMORPHONE HCL PF 1 MG/ML IJ SOLN: 0.2500 mg | INTRAMUSCULAR | Status: DC | PRN

## 2011-05-24 MED ORDER — CEFAZOLIN SODIUM-DEXTROSE 2-3 GM-% IV SOLR
2.0000 g | Freq: Once | INTRAVENOUS | Status: AC
  Administered 2011-05-24: 2 g via INTRAVENOUS

## 2011-05-24 MED ORDER — MELOXICAM 15 MG PO TABS
15.0000 mg | ORAL_TABLET | Freq: Every day | ORAL | Status: DC
  Filled 2011-05-24 (×3): qty 1

## 2011-05-24 MED ORDER — HEPARIN SODIUM (PORCINE) 5000 UNIT/ML IJ SOLN
5000.0000 [IU] | Freq: Three times a day (TID) | INTRAMUSCULAR | Status: DC
  Administered 2011-05-24 – 2011-05-25 (×2): 5000 [IU] via SUBCUTANEOUS
  Filled 2011-05-24 (×5): qty 1

## 2011-05-24 MED ORDER — KCL IN DEXTROSE-NACL 20-5-0.45 MEQ/L-%-% IV SOLN
INTRAVENOUS | Status: AC
  Filled 2011-05-24: qty 1000

## 2011-05-24 MED ORDER — TOPIRAMATE 25 MG PO TABS: 50.0000 mg | ORAL_TABLET | Freq: Two times a day (BID) | ORAL | Status: DC

## 2011-05-24 MED ORDER — LEVOTHYROXINE SODIUM 150 MCG PO TABS
150.0000 ug | ORAL_TABLET | ORAL | Status: DC
  Administered 2011-05-25: 150 ug via ORAL
  Filled 2011-05-24: qty 1

## 2011-05-24 MED ORDER — VITAMIN D (ERGOCALCIFEROL) 1.25 MG (50000 UNIT) PO CAPS: 50000.0000 [IU] | ORAL_CAPSULE | ORAL | Status: DC

## 2011-05-24 MED ORDER — FENTANYL CITRATE 0.05 MG/ML IJ SOLN
INTRAMUSCULAR | Status: DC | PRN
  Administered 2011-05-24: 100 ug via INTRAVENOUS
  Administered 2011-05-24: 50 ug via INTRAVENOUS
  Administered 2011-05-24: 100 ug via INTRAVENOUS

## 2011-05-24 MED ORDER — VENLAFAXINE HCL ER 75 MG PO CP24
75.0000 mg | ORAL_CAPSULE | Freq: Every day | ORAL | Status: DC
  Administered 2011-05-24: 75 mg via ORAL
  Filled 2011-05-24 (×2): qty 1

## 2011-05-24 MED ORDER — SCOPOLAMINE 1 MG/3DAYS TD PT72
MEDICATED_PATCH | TRANSDERMAL | Status: DC | PRN
  Administered 2011-05-24: 1 via TRANSDERMAL

## 2011-05-24 MED ORDER — LACTATED RINGERS IV SOLN
INTRAVENOUS | Status: DC
  Administered 2011-05-24: 1000 mL via INTRAVENOUS

## 2011-05-24 MED ORDER — BUPIVACAINE LIPOSOME 1.3 % IJ SUSP
20.0000 mL | Freq: Once | INTRAMUSCULAR | Status: DC
  Filled 2011-05-24: qty 20

## 2011-05-24 MED ORDER — LEVOTHYROXINE SODIUM 75 MCG PO TABS: 75.0000 ug | ORAL_TABLET | ORAL | Status: DC

## 2011-05-24 MED ORDER — SCOPOLAMINE 1 MG/3DAYS TD PT72
MEDICATED_PATCH | TRANSDERMAL | Status: AC
  Filled 2011-05-24: qty 1

## 2011-05-24 MED ORDER — PROPOFOL 10 MG/ML IV EMUL
INTRAVENOUS | Status: DC | PRN
  Administered 2011-05-24: 200 mg via INTRAVENOUS

## 2011-05-24 MED ORDER — TOPIRAMATE 25 MG PO TABS
50.0000 mg | ORAL_TABLET | Freq: Two times a day (BID) | ORAL | Status: DC
  Administered 2011-05-24: 50 mg via ORAL
  Filled 2011-05-24 (×3): qty 2

## 2011-05-24 MED ORDER — DEXAMETHASONE SODIUM PHOSPHATE 10 MG/ML IJ SOLN
INTRAMUSCULAR | Status: DC | PRN
  Administered 2011-05-24: 10 mg via INTRAVENOUS

## 2011-05-24 MED ORDER — ONDANSETRON HCL 4 MG/2ML IJ SOLN: 4.0000 mg | Freq: Four times a day (QID) | INTRAMUSCULAR | Status: DC | PRN

## 2011-05-24 MED ORDER — GLYCOPYRROLATE 0.2 MG/ML IJ SOLN
INTRAMUSCULAR | Status: DC | PRN
  Administered 2011-05-24: .6 mg via INTRAVENOUS

## 2011-05-24 MED ORDER — CEFAZOLIN SODIUM-DEXTROSE 2-3 GM-% IV SOLR
INTRAVENOUS | Status: AC
  Filled 2011-05-24: qty 50

## 2011-05-24 MED ORDER — FAMOTIDINE 10 MG PO TABS
10.0000 mg | ORAL_TABLET | Freq: Two times a day (BID) | ORAL | Status: DC
  Administered 2011-05-24: 10 mg via ORAL
  Filled 2011-05-24 (×3): qty 1

## 2011-05-24 MED ORDER — ACETAMINOPHEN 10 MG/ML IV SOLN
INTRAVENOUS | Status: DC | PRN
  Administered 2011-05-24: 1000 mg via INTRAVENOUS

## 2011-05-24 MED ORDER — KETOROLAC TROMETHAMINE 30 MG/ML IJ SOLN: 15.0000 mg | Freq: Once | INTRAMUSCULAR | Status: DC | PRN

## 2011-05-24 MED ORDER — PROMETHAZINE HCL 25 MG/ML IJ SOLN: 6.2500 mg | INTRAMUSCULAR | Status: DC | PRN

## 2011-05-24 MED ORDER — OXYCODONE-ACETAMINOPHEN 5-325 MG PO TABS
1.0000 | ORAL_TABLET | ORAL | Status: DC | PRN
  Administered 2011-05-24 – 2011-05-25 (×3): 2 via ORAL
  Filled 2011-05-24 (×3): qty 2

## 2011-05-24 MED ORDER — CEFAZOLIN SODIUM 1-5 GM-% IV SOLN
1.0000 g | Freq: Four times a day (QID) | INTRAVENOUS | Status: AC
  Administered 2011-05-24 – 2011-05-25 (×3): 1 g via INTRAVENOUS
  Filled 2011-05-24 (×3): qty 50

## 2011-05-24 MED ORDER — LACTATED RINGERS IV SOLN
INTRAVENOUS | Status: DC | PRN
  Administered 2011-05-24 (×2): via INTRAVENOUS

## 2011-05-24 MED ORDER — ONDANSETRON HCL 4 MG PO TABS: 4.0000 mg | ORAL_TABLET | Freq: Four times a day (QID) | ORAL | Status: DC | PRN

## 2011-05-24 MED ORDER — HEPARIN SODIUM (PORCINE) 5000 UNIT/ML IJ SOLN
5000.0000 [IU] | Freq: Once | INTRAMUSCULAR | Status: AC
  Administered 2011-05-24: 5000 [IU] via SUBCUTANEOUS

## 2011-05-24 MED ORDER — LEVOTHYROXINE SODIUM 75 MCG PO TABS: 75.0000 ug | ORAL_TABLET | Freq: Every day | ORAL | Status: DC

## 2011-05-24 MED ORDER — ONDANSETRON HCL 4 MG/2ML IJ SOLN
INTRAMUSCULAR | Status: DC | PRN
  Administered 2011-05-24: 4 mg via INTRAVENOUS

## 2011-05-24 MED ORDER — KCL IN DEXTROSE-NACL 20-5-0.45 MEQ/L-%-% IV SOLN
INTRAVENOUS | Status: DC
  Administered 2011-05-24: 75 mL/h via INTRAVENOUS
  Administered 2011-05-25: 05:00:00 via INTRAVENOUS
  Filled 2011-05-24 (×3): qty 1000

## 2011-05-24 MED ORDER — DROPERIDOL 2.5 MG/ML IJ SOLN
INTRAMUSCULAR | Status: DC | PRN
  Administered 2011-05-24: 0.625 mg via INTRAVENOUS

## 2011-05-24 MED ORDER — ROCURONIUM BROMIDE 100 MG/10ML IV SOLN
INTRAVENOUS | Status: DC | PRN
  Administered 2011-05-24: 10 mg via INTRAVENOUS
  Administered 2011-05-24: 40 mg via INTRAVENOUS
  Administered 2011-05-24: 25 mg via INTRAVENOUS

## 2011-05-24 SURGICAL SUPPLY — 54 items
APL SKNCLS STERI-STRIP NONHPOA (GAUZE/BANDAGES/DRESSINGS) ×1
BENZOIN TINCTURE PRP APPL 2/3 (GAUZE/BANDAGES/DRESSINGS) ×2 IMPLANT
BINDER ABD UNIV 12 45-62 (WOUND CARE) IMPLANT
BINDER ABDOMINAL 46IN 62IN (WOUND CARE)
BINDER BREAST XLRG (GAUZE/BANDAGES/DRESSINGS) IMPLANT
CANISTER SUCTION 2500CC (MISCELLANEOUS) ×2 IMPLANT
CANNULA ENDOPATH XCEL 11M (ENDOMECHANICALS) IMPLANT
CLOTH BEACON ORANGE TIMEOUT ST (SAFETY) ×2 IMPLANT
COVER SURGICAL LIGHT HANDLE (MISCELLANEOUS) ×2 IMPLANT
DECANTER SPIKE VIAL GLASS SM (MISCELLANEOUS) ×2 IMPLANT
DEVICE SECURE STRAP 25 ABSORB (INSTRUMENTS) IMPLANT
DEVICE TROCAR PUNCTURE CLOSURE (ENDOMECHANICALS) ×1 IMPLANT
DISSECTOR BLUNT TIP ENDO 5MM (MISCELLANEOUS) IMPLANT
DRAIN CHANNEL RND F F (WOUND CARE) IMPLANT
DRAPE LAPAROSCOPIC ABDOMINAL (DRAPES) ×2 IMPLANT
ELECT REM PT RETURN 9FT ADLT (ELECTROSURGICAL) ×2
ELECTRODE REM PT RTRN 9FT ADLT (ELECTROSURGICAL) ×1 IMPLANT
EVACUATOR SILICONE 100CC (DRAIN) IMPLANT
GLOVE BIOGEL M 8.0 STRL (GLOVE) ×2 IMPLANT
GLOVE BIOGEL PI IND STRL 7.0 (GLOVE) ×1 IMPLANT
GLOVE BIOGEL PI INDICATOR 7.0 (GLOVE) ×1
GOWN STRL NON-REIN LRG LVL3 (GOWN DISPOSABLE) ×4 IMPLANT
GOWN STRL REIN XL XLG (GOWN DISPOSABLE) ×5 IMPLANT
HAND ACTIVATED (MISCELLANEOUS) ×2 IMPLANT
KIT BASIN OR (CUSTOM PROCEDURE TRAY) ×2 IMPLANT
MESH HERNIA 3X6 (Mesh General) ×1 IMPLANT
NEEDLE SPNL 22GX3.5 QUINCKE BK (NEEDLE) ×2 IMPLANT
NS IRRIG 1000ML POUR BTL (IV SOLUTION) ×2 IMPLANT
PEN SKIN MARKING BROAD (MISCELLANEOUS) ×2 IMPLANT
PENCIL BUTTON HOLSTER BLD 10FT (ELECTRODE) ×2 IMPLANT
SET IRRIG TUBING LAPAROSCOPIC (IRRIGATION / IRRIGATOR) IMPLANT
SLEEVE XCEL OPT CAN 5 100 (ENDOMECHANICALS) ×1 IMPLANT
SLEEVE Z-THREAD 5X100MM (TROCAR) ×1 IMPLANT
SOLUTION ANTI FOG 6CC (MISCELLANEOUS) ×2 IMPLANT
STAPLER VISISTAT 35W (STAPLE) ×1 IMPLANT
STRIP CLOSURE SKIN 1/2X4 (GAUZE/BANDAGES/DRESSINGS) ×3 IMPLANT
SUT NOVA 0 T19/GS 22DT (SUTURE) IMPLANT
SUT NOVA 1 T20/GS 25DT (SUTURE) IMPLANT
SUT NOVA NAB DX-16 0-1 5-0 T12 (SUTURE) IMPLANT
SUT PDS AB 1 CT1 27 (SUTURE) ×6 IMPLANT
SUT PDS AB 1 CTX 36 (SUTURE) ×1 IMPLANT
SUT PROLENE 0 CT 1 CR/8 (SUTURE) ×3 IMPLANT
SUT VIC AB 4-0 SH 18 (SUTURE) ×3 IMPLANT
SYR 30ML LL (SYRINGE) ×2 IMPLANT
TACKER 5MM HERNIA 3.5CML NAB (ENDOMECHANICALS) ×2 IMPLANT
TRAY FOLEY CATH 14FRSI W/METER (CATHETERS) IMPLANT
TRAY LAP CHOLE (CUSTOM PROCEDURE TRAY) ×2 IMPLANT
TROCAR BLADELESS OPT 5 100 (ENDOMECHANICALS) ×2 IMPLANT
TROCAR HASSON GELL 12X100 (TROCAR) IMPLANT
TROCAR Z-THREAD FIOS 11X100 BL (TROCAR) ×1 IMPLANT
TROCAR Z-THREAD FIOS 5X100MM (TROCAR) ×3 IMPLANT
TROCAR Z-THREAD SLEEVE 11X100 (TROCAR) IMPLANT
TUBING FILTER THERMOFLATOR (ELECTROSURGICAL) IMPLANT
TUBING INSUFFLATION 10FT LAP (TUBING) ×2 IMPLANT

## 2011-05-24 NOTE — Anesthesia Postprocedure Evaluation (Signed)
  Anesthesia Post-op Note  Patient: Gabrielle Lynch  Procedure(s) Performed:  LAPAROSCOPIC VENTRAL HERNIA  Patient Location: PACU  Anesthesia Type: General  Level of Consciousness: awake and alert   Airway and Oxygen Therapy: Patient Spontanous Breathing  Post-op Pain: mild  Post-op Assessment: Post-op Vital signs reviewed, Patient's Cardiovascular Status Stable, Respiratory Function Stable, Patent Airway and No signs of Nausea or vomiting  Post-op Vital Signs: stable  Complications: No apparent anesthesia complications

## 2011-05-24 NOTE — Anesthesia Preprocedure Evaluation (Signed)
Anesthesia Evaluation  Patient identified by MRN, date of birth, ID band Patient awake    Reviewed: Allergy & Precautions, H&P , NPO status , Patient's Chart, lab work & pertinent test results  History of Anesthesia Complications (+) PONV  Airway Mallampati: II TM Distance: <3 FB Neck ROM: Full    Dental No notable dental hx.    Pulmonary neg pulmonary ROS,  clear to auscultation  Pulmonary exam normal       Cardiovascular hypertension, Regular Normal    Neuro/Psych Negative Neurological ROS  Negative Psych ROS   GI/Hepatic negative GI ROS, Neg liver ROS,   Endo/Other  Hypothyroidism   Renal/GU negative Renal ROS  Genitourinary negative   Musculoskeletal negative musculoskeletal ROS (+)   Abdominal   Peds negative pediatric ROS (+)  Hematology negative hematology ROS (+)   Anesthesia Other Findings   Reproductive/Obstetrics negative OB ROS                           Anesthesia Physical Anesthesia Plan  ASA: II  Anesthesia Plan: General   Post-op Pain Management:    Induction: Intravenous  Airway Management Planned: Oral ETT  Additional Equipment:   Intra-op Plan:   Post-operative Plan: Extubation in OR  Informed Consent: I have reviewed the patients History and Physical, chart, labs and discussed the procedure including the risks, benefits and alternatives for the proposed anesthesia with the patient or authorized representative who has indicated his/her understanding and acceptance.   Dental advisory given  Plan Discussed with: CRNA  Anesthesia Plan Comments:         Anesthesia Quick Evaluation

## 2011-05-24 NOTE — H&P (Signed)
Chief Complaint: Ventral hernia  History of Present Illness: Gabrielle Lynch is an 61 y.o. female white female who has an enlarging ventral hernia beneath her umbilicus and slightly to the right. It has been getting visibly larger and she is scheduled to have this repaired next Wednesday. I answered her questions regarding repair and mesh placement.  Past Medical History   Diagnosis  Date   .  Cervical dysplasia      HISTORY OF   .  Fibroid    .  Hypertension      HISTORY OF HYPERTENSION   .  Mast cell disorder  diag 1989     on Ketotiphen-mast cell inhibitor-not approved in [place]. Resulting in anaphylactic shock   .  PONV (postoperative nausea and vomiting)     Past Surgical History   Procedure  Date   .  Cervical biopsy w/ loop electrode excision  1992   .  Hysteroscopy  06/1995     HYSTEROSCOPIC MYOMECTOMY   .  Hysteroscopy  06/1998     D&C, HYSTEROSCOPY   .  Dilation and curettage of uterus  06/1998     D&C, HYSTEROSCOPY   .  Cholecystectomy    .  Knee surgery      X2   .  Laparoscopic gastric banding  2010   .  Cesarean section      X2   .  Abdominal hysterectomy  02/20/2011     Procedure: HYSTERECTOMY ABDOMINAL; Surgeon: Trellis Paganini, MD; Location: [facility]; Service: Gynecology; Laterality: N/A;   .  Salpingoophorectomy  02/20/2011     Procedure: SALPINGO OOPHERECTOMY; Surgeon: Trellis Paganini, MD; Location: [facility]; Service: Gynecology; Laterality: Bilateral;   .  Pubovaginal sling  02/20/2011     Procedure: Leonides Grills; Surgeon: Kathi Ludwig, MD; Location: [facility]; Service: Urology; Laterality: N/A;    Current Outpatient Prescriptions   Medication  Sig  Dispense  Refill   .  cetirizine (ZYRTEC) 10 MG tablet  Take 10 mg by mouth daily.     .  Estradiol Acetate (FEMRING) 0.05 MG/24HR RING  Insert one in vagina every 90 days  0.9 each  3   .  FINASTERIDE PO  Take 2.5 mg by mouth daily.     .  hydrOXYzine (ATARAX/VISTARIL) 50 MG tablet  Take 50 mg by mouth  daily.     Marland Kitchen  levothyroxine (SYNTHROID, LEVOTHROID) 150 MCG tablet  Take 75-150 mcg by mouth at bedtime. 75 mg weekly on Sunday and 150 mg all other days.     .  meloxicam (MOBIC) 15 MG tablet  Take 15 mg by mouth daily.     .  montelukast (SINGULAIR) 10 MG tablet  Take 10 mg by mouth at bedtime.     Marland Kitchen  PRESCRIPTION MEDICATION  Take 1 tablet by mouth 2 (two) times daily. Ketotifen 5mg  tablet[place] For mast cell problem     .  ranitidine (ZANTAC) 300 MG tablet  Take 300 mg by mouth at bedtime.     .  temazepam (RESTORIL) 30 MG capsule  Take 30 mg by mouth at bedtime as needed. Sleep     .  topiramate (TOPAMAX) 50 MG tablet  Take 50 mg by mouth 2 (two) times daily.     Marland Kitchen  venlafaxine (EFFEXOR XR) 75 MG 24 hr capsule  Take 75 mg by mouth at bedtime.     .  Vitamin D, Ergocalciferol, (DRISDOL) 50000  UNITS CAPS  Take 50,000 Units by mouth every 7 (seven) days. sundays      Demerol  Family History   Problem  Relation  Age of Onset   .  Breast cancer  Mother    .  Hypertension  Mother    .  Cancer  Father       LUNG    Social History: reports that she has never smoked. She does not have any smokeless tobacco history on file. She reports that she does not drink alcohol or use illicit drugs.  REVIEW OF SYSTEMS - PERTINENT POSITIVES ONLY:  Had amnesic reaction to Ambien before her surgery at [facility]  Physical Exam:  Blood pressure 142/88, pulse 88, resp. rate 18, height 5\' 5"  (1.651 m), weight 189 lb (85.73 kg).  Body mass index is 31.45 kg/(m^2).  Gen: WDWN W F NAD  Neurological: Alert and oriented to person, place, and time. Motor and sensory function is grossly intact  Head: Normocephalic and atraumatic.  Eyes: Conjunctivae are normal. Pupils are equal, round, and reactive to light. No scleral icterus.  Neck: Normal range of motion. Neck supple. No tracheal deviation or thyromegaly present.  Cardiovascular: SR without murmurs or gallops. No carotid bruits    Respiratory: Effort normal. No respiratory distress. No chest wall tenderness. Breath sounds normal. No wheezes, rales or rhonchi.  Abdomen: Prominent bulge in the midline and slightly to the right compatible with ventral hernia  GU:  Musculoskeletal: Normal range of motion. Extremities are nontender. No cyanosis, edema or clubbing noted Lymphadenopathy: No cervical, preauricular, postauricular or axillary adenopathy is present Skin: Skin is warm and dry. No rash noted. No diaphoresis. No erythema. No pallor. Pscyh: Normal mood and affect. Behavior is normal. Judgment and thought content normal.  LABORATORY RESULTS:  No results found for this or any previous visit (from the past 48 hour(s)).  RADIOLOGY RESULTS:  No results found.  Problem List:  Patient Active Problem List   Diagnoses   .  DEGENERATIVE JOINT DISEASE   .  SPONDYLOSIS UNSPEC SITE W/O MENTION MYELOPATHY   .  SINUS TARSI SYNDROME   .  UNEQUAL LEG LENGTH   .  Cervical dysplasia   .  Fibroid   .  Hypertension   .  Mast cell disorder    Assessment & Plan:  Midline ventral hernia. Plan lap assisted ventral hernia repair with probable anterior mesh and possible intraabdominal mesh.  Matt B. Daphine Deutscher, MD, [facility]  [phone] beeper  [phone]  There has been no change in the patient's past medical history or physical exam in the past 24 hours to the best of my knowledge.  Expectations and outcome results have been discussed with the patient to include risks and benefits.  All questions have been answered and will proceed with previously discussed procedure noted and signed in the consent form in the patient's record.    Marriana Hibberd BMD @NOW  05/24/2011

## 2011-05-24 NOTE — Progress Notes (Signed)
Midline incision intact with Dermabond .

## 2011-05-24 NOTE — Anesthesia Procedure Notes (Signed)
Procedure Name: Intubation Date/Time: 05/24/2011 11:31 AM Performed by: [place], Daimon Kean C Pre-anesthesia Checklist: Patient identified, Timeout performed, Emergency Drugs available, Suction available and Patient being monitored Patient Re-evaluated:Patient Re-evaluated prior to inductionOxygen Delivery Method: Circle System Utilized Preoxygenation: Pre-oxygenation with 100% oxygen Intubation Type: IV induction Ventilation: Mask ventilation without difficulty Laryngoscope Size: Mac and 4 Grade View: Grade II Tube type: Oral Tube size: 7.5 mm Number of attempts: 2 Airway Equipment and Method: bougie stylet Placement Confirmation: ETT inserted through vocal cords under direct vision,  breath sounds checked- equal and bilateral,  positive ETCO2 and CO2 detector Secured at: 21 cm Tube secured with: Tape Dental Injury: Teeth and Oropharynx as per pre-operative assessment

## 2011-05-24 NOTE — Transfer of Care (Signed)
Immediate Anesthesia Transfer of Care Note  Patient: Gabrielle Lynch  Procedure(s) Performed:  LAPAROSCOPIC VENTRAL HERNIA  Patient Location: PACU  Anesthesia Type: General  Level of Consciousness: awake and alert   Airway & Oxygen Therapy: Patient Spontanous Breathing and Patient connected to face mask oxygen  Post-op Assessment: Report given to PACU RN and Post -op Vital signs reviewed and stable  Post vital signs: Reviewed and stable  Complications: No apparent anesthesia complications

## 2011-05-24 NOTE — Brief Op Note (Signed)
05/24/2011  2:46 PM  PATIENT:  Gabrielle Lynch  61 y.o. female  PRE-OPERATIVE DIAGNOSIS:  ventral incisonal hernia  POST-OPERATIVE DIAGNOSIS:  ventral incisonal hernia  PROCEDURE:  Procedure(s): LAPAROSCOPIC VENTRAL HERNIA  SURGEON:  Surgeon(s): Valarie Merino, MD  PHYSICIAN ASSISTANT:   ASSISTANTS: none   ANESTHESIA:   Lap assisted ventral incisional hernia repair with Marlex mesh  EBL:  Total I/O In: 1800 [I.V.:1800] Out: 25 [Blood:25]  BLOOD ADMINISTERED:none  DRAINS: none   LOCAL MEDICATIONS USED:  OTHER Exparel  SPECIMEN:  Source of Specimen:  hernia sac  DISPOSITION OF SPECIMEN:  PATHOLOGY  COUNTS:  YES  TOURNIQUET:  * No tourniquets in log *  DICTATION: .Other Dictation: Dictation Number [id number]  PLAN OF CARE: Admit to inpatient   PATIENT DISPOSITION:  PACU - hemodynamically stable.   Delay start of Pharmacological VTE agent (>24hrs) due to surgical blood loss or risk of bleeding:  {YES/NO/NOT APPLICABLE:20182

## 2011-05-25 ENCOUNTER — Telehealth (INDEPENDENT_AMBULATORY_CARE_PROVIDER_SITE_OTHER): Payer: Self-pay | Admitting: Surgery

## 2011-05-25 LAB — CBC
HCT: 35.8 % — ABNORMAL LOW (ref 36.0–46.0)
Hemoglobin: 11.7 g/dL — ABNORMAL LOW (ref 12.0–15.0)
MCV: 90.4 fL (ref 78.0–100.0)
WBC: 16.9 10*3/uL — ABNORMAL HIGH (ref 4.0–10.5)

## 2011-05-25 LAB — DIFFERENTIAL
Lymphocytes Relative: 6 % — ABNORMAL LOW (ref 12–46)
Lymphs Abs: 1 10*3/uL (ref 0.7–4.0)
Monocytes Absolute: 1.5 10*3/uL — ABNORMAL HIGH (ref 0.1–1.0)
Monocytes Relative: 9 % (ref 3–12)
Neutro Abs: 14.3 10*3/uL — ABNORMAL HIGH (ref 1.7–7.7)

## 2011-05-25 MED ORDER — OXYCODONE-ACETAMINOPHEN 5-325 MG PO TABS: 1.0000 | ORAL_TABLET | ORAL | Status: AC | PRN

## 2011-05-25 NOTE — Discharge Summary (Signed)
Physician Discharge Summary  Patient ID: Gabrielle Lynch MRN: [id number] DOB/AGE: 1951-04-27 61 y.o.  Admit date: 05/24/2011 Discharge date: 05/25/2011  Admission Diagnoses:    Discharge Diagnoses:    Active Problems:  * No active hospital problems. *    Surgery:    Discharged Condition: good  Hospital Course:   Pt had large lower midline incisional hernia repaired by lateral relaxing incisions, advancement with primary closure and onlay of Marlex type mesh.    Consults: none  Significant Diagnostic Studies: none    Discharge Exam: Blood pressure 126/82, pulse 75, temperature 98.8 F (37.1 C), temperature source Oral, resp. rate 18, height 5\' 5"  (1.651 m), weight 194 lb 6.4 oz (88.179 kg), SpO2 99.00%. Had Exparel injection and pain was controlled.   Disposition: Home or Self Care  Discharge Orders    Future Orders Please Complete By Expires   Diet - low sodium heart healthy      Increase activity slowly      Discharge instructions      Comments:   Wear abdominal binder May shower today    Discharge wound care:      Comments:   May shower with dermabond in place.   Wear abdominal binder     Medication List  As of 05/25/2011  8:49 AM   TAKE these medications         cetirizine 10 MG tablet   Commonly known as: ZYRTEC   Take 10 mg by mouth daily.      EFFEXOR XR 75 MG 24 hr capsule   Generic drug: venlafaxine   Take 75 mg by mouth at bedtime.      Estradiol Acetate 0.05 MG/24HR Ring   Insert one in vagina every 90 days      FINASTERIDE PO   Take 2.5 mg by mouth daily.      hydrOXYzine 50 MG tablet   Commonly known as: ATARAX/VISTARIL   Take 50 mg by mouth daily.      lactulose 10 G packet   Commonly known as: CEPHULAC   Take 10 g by mouth at bedtime.      levothyroxine 150 MCG tablet   Commonly known as: SYNTHROID, LEVOTHROID   Take 75-150 mcg by mouth at bedtime. 75 mcg weekly on Sunday and 150 mcg all other days.      meloxicam 15 MG tablet   Commonly known as: MOBIC   Take 15 mg by mouth daily.      montelukast 10 MG tablet   Commonly known as: SINGULAIR   Take 10 mg by mouth at bedtime.      oxyCODONE-acetaminophen 5-325 MG per tablet   Commonly known as: PERCOCET   Take 1 tablet by mouth every 4 (four) hours as needed for pain.      PRESCRIPTION MEDICATION   Take 1 tablet by mouth 2 (two) times daily. Ketotifen 5mg  tablet[place] For mast cell problem      PRESCRIPTION MEDICATION   PT CARRIES EPI PEN      ranitidine 300 MG tablet   Commonly known as: ZANTAC   Take 300 mg by mouth at bedtime.      temazepam 30 MG capsule   Commonly known as: RESTORIL   Take 30 mg by mouth at bedtime as needed. Sleep        topiramate 50 MG tablet   Commonly known as: TOPAMAX   Take 50 mg by mouth 2 (two) times daily.  Vitamin D (Ergocalciferol) 50000 UNITS Caps   Commonly known as: DRISDOL   Take 50,000 Units by mouth every 7 (seven) days. sundays           Follow-up Information    Follow up with Daysen Gundrum B, MD in 10 days. (Call for any questions or concerns)    Contact information:   [facility] [address] [place] [address] [phone]          Signed: Valarie Merino 05/25/2011, 8:49 AM

## 2011-05-25 NOTE — Op Note (Signed)
[name], [name]               ACCOUNT NO.:  [id number]  MEDICAL RECORD NO.:  [id number]  LOCATION:  1534                         FACILITY:  [facility]  PHYSICIAN:  Gabrielle Park. Daphine Deutscher, MD  DATE OF BIRTH:  1951/02/03  DATE OF PROCEDURE:  05/24/2011 DATE OF DISCHARGE:                              OPERATIVE REPORT   SURGEON:  Gabrielle Park. Daphine Deutscher, MD  ASSISTANT:  None.  ANESTHESIA:  General.  PROCEDURE:  Laparoscopically assisted lower ventral incisional hernia repair  with anterior onlay of Marlex type mesh.  COMPLICATIONS:  None.  ESTIMATED BLOOD LOSS:  Minimal.  DESCRIPTION OF PROCEDURE:  This 61 year old white female was taken to room 11 at [facility] and given general anesthesia.  A time- out was performed.  This was done after the PCMX was used to prep the abdomen and she was draped sterilely.  I entered the abdomen  initially tried to the left upper quadrant, but then cut into the right upper quadrant with a 5 mm 0 degree Optiview and through these 2 ports, I found this rather large probably a 12 cm hernia from the midline.  The patient had a Pfannenstiel incision back in October and this came up subsequent to that.  This was not something that I could anchor a piece of intra-abdominal mesh on because it went down very close to the pubis. This is also where she had had a pelvic sling for urinary incontinence. I elected to make a longitudinal incision and dissect free the sac in toto and excised that.  I then went laterally raising flaps and exposing the fascia.  On either side laterally, I then incised the anterior fascia and created relaxing incisions.  I then worked both laparoscopically and open to assess things and after my initial closure of the midline fascia with a running #1 PDS from either end, tied in the middle.  I found that she had more of an eventration superior involving her umbilicus.  I then took her umbilicus off this area and elected to repair  this entire area with a large piece of mesh going up over the umbilicus.  I put a piece of 3 x 6 inch Marlex type mesh and with her insufflated, I placed horizontal mattress sutures approximately 12 around the perimeter of the mesh and left them in place.  The lateral ones went beyond the relaxing incisions.  I then deflated the abdomen and curetted the 0 Prolene up through the Marlex mesh and tied it down. With this technique, everything was pretty and I then insufflated and it seemed to nicely not only reduced the defect, but it affected the eventration and made it look very much normal.  Further horizontal mattress sutures were placed to tack the mesh anteriorly to the fascia and then the area was infiltrated with Exparel. It was irrigated copiously.  I closed the subcutaneous fat down on to the mesh to obliterate the space as I did not place a drain.  The wounds were then closed with 4-0 Vicryl and Dermabond.  An abdominal binder was placed.  The patient was taken to recovery room in satisfactory condition.     Gabrielle Park Daphine Deutscher, MD  MBM/MEDQ  D:  05/24/2011  T:  05/24/2011  Job:  [id number]

## 2011-05-25 NOTE — Progress Notes (Signed)
Patient provided with discharge teaching and prescription. Patient verbalized understanding. Patient discharged to home.  

## 2011-05-25 NOTE — Telephone Encounter (Signed)
Pt needs a 10 day po appt, soonest available is in March, please call.

## 2011-05-26 ENCOUNTER — Encounter (HOSPITAL_COMMUNITY): Payer: Self-pay | Admitting: Surgery

## 2011-06-02 HISTORY — PX: UPPER GASTROINTESTINAL ENDOSCOPY: SHX188

## 2011-06-08 ENCOUNTER — Telehealth: Payer: Self-pay | Admitting: *Deleted

## 2011-06-08 NOTE — Telephone Encounter (Signed)
Tell pt this sometimes happens after surgery. It is not serious but she should make appt to see me in next several weeks and I can treat it easily in office to stop it.

## 2011-06-08 NOTE — Telephone Encounter (Signed)
(  PT IS AWARE YOU ARE OUT OF THE OFFICE) PT IS POST HYSTERECTOMY ABDOMINAL SALPINGO OOPHERECTOMY IN October. SHE NOTED YESTERDAY WHEN USING BATHROOM AND WIPING SHE HAD SOME BLOOD ON TISSUE. NOT A LOT(VERY LIGHT) BUT 3 TIMES THIS HAPPENED AND IT WAS BRIGHT RED, NO OTHER COMPLAINS SHE HAD NOT SEEN ANY BLOOD TODAY NOR ANY BLOOD SINCE SURGERY. PT ASKED IS THIS NORMAL BEING THAT SURGERY WAS BACK IN October? PLEASE ADVISE

## 2011-06-08 NOTE — Telephone Encounter (Signed)
Pt informed with the below note. 

## 2011-06-22 ENCOUNTER — Telehealth (INDEPENDENT_AMBULATORY_CARE_PROVIDER_SITE_OTHER): Payer: Self-pay | Admitting: Surgery

## 2011-06-22 ENCOUNTER — Encounter (INDEPENDENT_AMBULATORY_CARE_PROVIDER_SITE_OTHER): Payer: Self-pay | Admitting: Surgery

## 2011-06-22 ENCOUNTER — Ambulatory Visit (INDEPENDENT_AMBULATORY_CARE_PROVIDER_SITE_OTHER): Payer: BC Managed Care – PPO | Admitting: Surgery

## 2011-06-22 VITALS — BP 146/98 | HR 80 | Temp 97.4°F | Resp 16 | Ht 65.0 in | Wt 190.0 lb

## 2011-06-22 DIAGNOSIS — K922 Gastrointestinal hemorrhage, unspecified: Secondary | ICD-10-CM

## 2011-06-22 NOTE — Progress Notes (Signed)
Gabrielle Lynch called on Feb 8 having vomited a small amount of blood. She wondered whether this could have been related to her recent incisional hernia repair. She was about to leave for [facility] in [place].  She was able to see [name] on the following Monday and on EGD had 3 esophageal erosions according to her. The paperwork from just office is in transit. She is taking a PPI and ranitidine h.s. She did not have any further emesis with blood and denies dark tarry stools  On exam today she has a pannus and the broadened weakness in her abdominal muscles is more accentuated. Her incision has healed well. She did have a cavity there which may have a little fluid in it. Beneath that however I think she has some diastases recti. I think that she will ultimately be best served and pleased with a panniculectomy and repair of her abdominal wall. I recommended that she see Dr. Delia Chimes regarding this.  In the meantime she is feeling tired. To assess her hemoglobin will get a  CBC. Also ask her to begin taking some iron and vitamin C supplements. I will see her again in 6 weeks. She is scheduled to go to [place]  in May.  Return 6 weeks

## 2011-07-17 ENCOUNTER — Telehealth (INDEPENDENT_AMBULATORY_CARE_PROVIDER_SITE_OTHER): Payer: Self-pay | Admitting: General Surgery

## 2011-07-17 NOTE — Telephone Encounter (Signed)
Patient calling in to report she was in a hospital in [place], secondary to noro virus, and had a CT during her work-up.  The MD there said there was about of liter of liquid that has accumulated at the site of her recent hernia repair.  The patient was unaware of it and has no complaints of pain or discomfort, but was advised to see her surgeon when she returned home.  She has an appt on 08/03/11 with Dr. Daphine Deutscher already scheduled and is not sure is this should wait for then or not.

## 2011-07-17 NOTE — Telephone Encounter (Signed)
The patient is scheduled 07/20/11 for follow up to hosp visit while in [place]

## 2011-07-20 ENCOUNTER — Ambulatory Visit (INDEPENDENT_AMBULATORY_CARE_PROVIDER_SITE_OTHER): Payer: BC Managed Care – PPO | Admitting: Surgery

## 2011-07-20 ENCOUNTER — Encounter (INDEPENDENT_AMBULATORY_CARE_PROVIDER_SITE_OTHER): Payer: Self-pay | Admitting: Surgery

## 2011-07-20 VITALS — BP 132/88 | HR 84 | Temp 97.0°F | Resp 18 | Ht 65.0 in | Wt 189.2 lb

## 2011-07-20 DIAGNOSIS — Z4651 Encounter for fitting and adjustment of gastric lap band: Secondary | ICD-10-CM

## 2011-07-20 DIAGNOSIS — Z9884 Bariatric surgery status: Secondary | ICD-10-CM | POA: Insufficient documentation

## 2011-07-20 DIAGNOSIS — IMO0002 Reserved for concepts with insufficient information to code with codable children: Secondary | ICD-10-CM

## 2011-07-20 NOTE — Progress Notes (Signed)
Gabrielle Lynch 61 y.o.  Body mass index is 31.48 kg/(m^2).  Patient Active Problem List  Diagnoses  . DEGENERATIVE JOINT DISEASE  . SPONDYLOSIS UNSPEC SITE W/O MENTION MYELOPATHY  . SINUS TARSI SYNDROME  . UNEQUAL LEG LENGTH  . Cervical dysplasia  . Fibroid  . Hypertension  . Mast cell disorder    Allergies  Allergen Reactions  . Demerol Nausea Only    Past Surgical History  Procedure Date  . Cervical biopsy  w/ loop electrode excision 1992  . Hysteroscopy 06/1995    HYSTEROSCOPIC MYOMECTOMY  . Hysteroscopy 06/1998    D&C, HYSTEROSCOPY  . Dilation and curettage of uterus 06/1998    D&C, HYSTEROSCOPY  . Cholecystectomy   . Knee surgery     X2 right knee  . Laparoscopic gastric banding 2010  . Cesarean section     X2  . Abdominal hysterectomy 02/20/2011    Procedure: HYSTERECTOMY ABDOMINAL;  Surgeon: Trellis Paganini, MD;  Location: [facility];  Service: Gynecology;  Laterality: N/A;  . Salpingoophorectomy 02/20/2011    Procedure: SALPINGO OOPHERECTOMY;  Surgeon: Trellis Paganini, MD;  Location: [facility];  Service: Gynecology;  Laterality: Bilateral;  . Pubovaginal sling 02/20/2011    Procedure: Leonides Grills;  Surgeon: Kathi Ludwig, MD;  Location: [facility];  Service: Urology;  Laterality: N/A;  . Ventral hernia repair 05/24/2011    Procedure: LAPAROSCOPIC VENTRAL HERNIA;  Surgeon: Valarie Merino, MD;  Location: [facility];  Service: General;  Laterality: N/A;  . Upper gastrointestinal endoscopy 06/2011   Darnelle Bos, MD, MD No diagnosis found.  Gabrielle Lynch was [facility] hospitalized [facility] [place] for[place]ovirus.  She had a CT scan which showed some thickening of her distal esophagus which goes along with esophagitis findings seen by [name]. Today I took out 1/2 cc from her band.    An additional CT scan she had a seroma which had felt before where are removed and repaired her ventral hernia. I prepped that area with chlorhexidine and  aspirated a 180 cc seromatous. I recommended that she wear and abdominal binder for a while. She has an appointment in April which I told her to keep if she needs to see me. She will be leaving for [place] in mid May. Will discuss readjustment of her band at some point when we want to make a little bit tighter.  Impression recovering from Norovirus.  Seroma from ventral hernia repair.   Matt B. Daphine Deutscher, MD, [facility] [phone] beeper [phone]  07/20/2011 9:46 AM

## 2011-07-20 NOTE — Patient Instructions (Signed)
Wear abdominal binder to slow seroma accumulation

## 2011-08-03 ENCOUNTER — Encounter (INDEPENDENT_AMBULATORY_CARE_PROVIDER_SITE_OTHER): Payer: BC Managed Care – PPO | Admitting: Surgery

## 2011-08-14 ENCOUNTER — Other Ambulatory Visit: Payer: Self-pay | Admitting: Obstetrics and Gynecology

## 2011-08-14 DIAGNOSIS — Z1231 Encounter for screening mammogram for malignant neoplasm of breast: Secondary | ICD-10-CM

## 2011-08-28 ENCOUNTER — Ambulatory Visit: Payer: BC Managed Care – PPO

## 2011-08-29 ENCOUNTER — Telehealth (INDEPENDENT_AMBULATORY_CARE_PROVIDER_SITE_OTHER): Payer: Self-pay | Admitting: General Surgery

## 2011-08-29 ENCOUNTER — Encounter (INDEPENDENT_AMBULATORY_CARE_PROVIDER_SITE_OTHER): Payer: Self-pay

## 2011-08-29 NOTE — Telephone Encounter (Signed)
Called patient to advise that appointment was made for Thursday 08/31/11 at 9:00 per Dr. Ermalene Searing request. Advised patient to arrive at 8:45. Patient agreed.

## 2011-08-31 ENCOUNTER — Encounter (INDEPENDENT_AMBULATORY_CARE_PROVIDER_SITE_OTHER): Payer: Self-pay | Admitting: Surgery

## 2011-08-31 ENCOUNTER — Ambulatory Visit (INDEPENDENT_AMBULATORY_CARE_PROVIDER_SITE_OTHER): Payer: BC Managed Care – PPO | Admitting: Surgery

## 2011-08-31 VITALS — BP 126/84 | HR 70 | Temp 96.9°F | Resp 14 | Ht 65.0 in | Wt 190.1 lb

## 2011-08-31 DIAGNOSIS — IMO0002 Reserved for concepts with insufficient information to code with codable children: Secondary | ICD-10-CM

## 2011-08-31 NOTE — Progress Notes (Signed)
Gabrielle Lynch 61 y.o.  Body mass index is 31.64 kg/(m^2).  Patient Active Problem List  Diagnoses  . DEGENERATIVE JOINT DISEASE  . SPONDYLOSIS UNSPEC SITE W/O MENTION MYELOPATHY  . SINUS TARSI SYNDROME  . UNEQUAL LEG LENGTH  . Cervical dysplasia  . Fibroid  . Hypertension  . Mast cell disorder  . Postoperative seroma-in ventral hernia repair site  . Lapband APS April 2010    Allergies  Allergen Reactions  . Demerol Nausea Only    Past Surgical History  Procedure Date  . Cervical biopsy  w/ loop electrode excision 1992  . Hysteroscopy 06/1995    HYSTEROSCOPIC MYOMECTOMY  . Hysteroscopy 06/1998    D&C, HYSTEROSCOPY  . Dilation and curettage of uterus 06/1998    D&C, HYSTEROSCOPY  . Cholecystectomy   . Knee surgery     X2 right knee  . Laparoscopic gastric banding 2010  . Cesarean section     X2  . Abdominal hysterectomy 02/20/2011    Procedure: HYSTERECTOMY ABDOMINAL;  Surgeon: Trellis Paganini, MD;  Location: [facility];  Service: Gynecology;  Laterality: N/A;  . Salpingoophorectomy 02/20/2011    Procedure: SALPINGO OOPHERECTOMY;  Surgeon: Trellis Paganini, MD;  Location: [facility];  Service: Gynecology;  Laterality: Bilateral;  . Pubovaginal sling 02/20/2011    Procedure: Leonides Grills;  Surgeon: Kathi Ludwig, MD;  Location: [facility];  Service: Urology;  Laterality: N/A;  . Ventral hernia repair 05/24/2011    Procedure: LAPAROSCOPIC VENTRAL HERNIA;  Surgeon: Valarie Merino, MD;  Location: [facility];  Service: General;  Laterality: N/A;  . Upper gastrointestinal endoscopy 06/2011   Darnelle Bos, MD, MD No diagnosis found.  [name] is heading to [place] around May 22.  I will see her a few days before and give her a band vacation before she goes abroad.   Matt B. Daphine Deutscher, MD, [facility] [phone] beeper [phone]  08/31/2011 9:28 AM

## 2011-11-16 ENCOUNTER — Ambulatory Visit (INDEPENDENT_AMBULATORY_CARE_PROVIDER_SITE_OTHER): Payer: BC Managed Care – PPO | Admitting: Surgery

## 2011-11-16 ENCOUNTER — Encounter (INDEPENDENT_AMBULATORY_CARE_PROVIDER_SITE_OTHER): Payer: Self-pay | Admitting: Surgery

## 2011-11-16 VITALS — BP 150/98 | HR 72 | Temp 97.1°F | Resp 16 | Ht 65.0 in | Wt 200.6 lb

## 2011-11-16 DIAGNOSIS — Z4651 Encounter for fitting and adjustment of gastric lap band: Secondary | ICD-10-CM

## 2011-11-16 DIAGNOSIS — Z9884 Bariatric surgery status: Secondary | ICD-10-CM

## 2011-11-16 NOTE — Patient Instructions (Addendum)

## 2011-11-16 NOTE — Progress Notes (Signed)
Gabrielle Lynch Body mass index is 33.38 kg/(m^2).  Having regurgitation:  no  Nocturnal reflux?  no  Amount of fill  1.5 cc  Gabrielle Lynch gained a bit on the cruise (11 lbs) since I removed fluid before her Gabrielle Lynch.  Current plans are for plastic surgery by Gabrielle Lynch in Sept (25th?)

## 2011-12-05 ENCOUNTER — Ambulatory Visit
Admission: RE | Admit: 2011-12-05 | Discharge: 2011-12-05 | Disposition: A | Discharge status: Home / Self Care | Payer: BC Managed Care – PPO | Source: Ambulatory Visit | Attending: Obstetrics and Gynecology | Admitting: Obstetrics and Gynecology

## 2011-12-05 DIAGNOSIS — Z1231 Encounter for screening mammogram for malignant neoplasm of breast: Secondary | ICD-10-CM

## 2011-12-15 ENCOUNTER — Ambulatory Visit (INDEPENDENT_AMBULATORY_CARE_PROVIDER_SITE_OTHER): Payer: BC Managed Care – PPO | Admitting: Psychology

## 2011-12-15 DIAGNOSIS — F4320 Adjustment disorder, unspecified: Secondary | ICD-10-CM

## 2012-01-18 ENCOUNTER — Encounter (INDEPENDENT_AMBULATORY_CARE_PROVIDER_SITE_OTHER): Payer: Self-pay | Admitting: Surgery

## 2012-01-18 ENCOUNTER — Ambulatory Visit (INDEPENDENT_AMBULATORY_CARE_PROVIDER_SITE_OTHER): Payer: BC Managed Care – PPO | Admitting: Surgery

## 2012-01-18 VITALS — BP 138/80 | HR 78 | Temp 98.7°F | Resp 18 | Ht 65.0 in | Wt 201.0 lb

## 2012-01-18 DIAGNOSIS — Z9884 Bariatric surgery status: Secondary | ICD-10-CM

## 2012-01-18 NOTE — Patient Instructions (Signed)

## 2012-01-18 NOTE — Progress Notes (Signed)
Gabrielle Lynch Body mass index is 33.45 kg/(m^2).  Having regurgitation:  no  Nocturnal reflux?  no  Amount of fill  0.5  May delay her plastic surgey  With Ortencia Kick until January.

## 2012-02-29 ENCOUNTER — Ambulatory Visit (INDEPENDENT_AMBULATORY_CARE_PROVIDER_SITE_OTHER): Payer: BC Managed Care – PPO | Admitting: Surgery

## 2012-02-29 ENCOUNTER — Encounter (INDEPENDENT_AMBULATORY_CARE_PROVIDER_SITE_OTHER): Payer: Self-pay | Admitting: Surgery

## 2012-02-29 VITALS — BP 132/88 | HR 68 | Temp 97.2°F | Resp 14 | Ht 65.0 in | Wt 193.6 lb

## 2012-02-29 DIAGNOSIS — K219 Gastro-esophageal reflux disease without esophagitis: Secondary | ICD-10-CM

## 2012-02-29 DIAGNOSIS — Z6832 Body mass index (BMI) 32.0-32.9, adult: Secondary | ICD-10-CM

## 2012-02-29 DIAGNOSIS — E669 Obesity, unspecified: Secondary | ICD-10-CM

## 2012-02-29 MED ORDER — ESOMEPRAZOLE MAGNESIUM 20 MG PO CPDR: 20.0000 mg | DELAYED_RELEASE_CAPSULE | Freq: Every day | ORAL | Status: DC

## 2012-02-29 NOTE — Patient Instructions (Signed)

## 2012-02-29 NOTE — Progress Notes (Signed)
Gabrielle Lynch comes in today in followup. I added a half CC were band in September. She's down by 8 pounds roughly. She scheduled her for cosmetic surgery in February per Delia Chimes. She is having GERD and I treated her with Nexium 20 mg at her request as she is willing to have a little reflux in exchange for the restriction that she gets.  She has recently developed a problem with Splenda.  Advised to use honey instead of sugar.   Nexium sent in to her pharmacy.    Return 8 weeks

## 2012-03-18 ENCOUNTER — Telehealth (INDEPENDENT_AMBULATORY_CARE_PROVIDER_SITE_OTHER): Payer: Self-pay | Admitting: General Surgery

## 2012-03-18 ENCOUNTER — Telehealth (INDEPENDENT_AMBULATORY_CARE_PROVIDER_SITE_OTHER): Payer: Self-pay

## 2012-03-18 NOTE — Telephone Encounter (Signed)
The pt called in this morning stating she is having lap band issues and wants to see Dr Daphine Deutscher Wednesday or Thursday.  I will send a message to Penni Bombard to see if she can be worked in.

## 2012-03-18 NOTE — Telephone Encounter (Signed)
Spoke with pt and informed her that I got her an appt w/ Dr. Daphine Deutscher 11/21 at 11:50.

## 2012-03-21 ENCOUNTER — Encounter (INDEPENDENT_AMBULATORY_CARE_PROVIDER_SITE_OTHER): Payer: Self-pay | Admitting: Surgery

## 2012-03-21 ENCOUNTER — Ambulatory Visit (INDEPENDENT_AMBULATORY_CARE_PROVIDER_SITE_OTHER): Payer: BC Managed Care – PPO | Admitting: Surgery

## 2012-03-21 VITALS — BP 110/80 | HR 90 | Temp 97.8°F | Resp 18 | Ht 65.0 in | Wt 190.4 lb

## 2012-03-21 DIAGNOSIS — Z9884 Bariatric surgery status: Secondary | ICD-10-CM

## 2012-03-21 DIAGNOSIS — Z6830 Body mass index (BMI) 30.0-30.9, adult: Secondary | ICD-10-CM

## 2012-03-21 NOTE — Progress Notes (Signed)
[name] 61 y.o.  Body mass index is 31.68 kg/(m^2).  Patient Active Problem List  Diagnosis  . DEGENERATIVE JOINT DISEASE  . SPONDYLOSIS UNSPEC SITE W/O MENTION MYELOPATHY  . SINUS TARSI SYNDROME  . UNEQUAL LEG LENGTH  . Cervical dysplasia  . Fibroid  . Hypertension  . Mast cell disorder  . Postoperative seroma-in ventral hernia repair site  . Lapband APS April 2010    Allergies  Allergen Reactions  . Demerol Nausea Only    Past Surgical History  Procedure Date  . Cervical biopsy  w/ loop electrode excision 1992  . Hysteroscopy 06/1995    HYSTEROSCOPIC MYOMECTOMY  . Hysteroscopy 06/1998    D&C, HYSTEROSCOPY  . Dilation and curettage of uterus 06/1998    D&C, HYSTEROSCOPY  . Cholecystectomy   . Knee surgery     X2 right knee  . Laparoscopic gastric banding 2010  . Cesarean section     X2  . Abdominal hysterectomy 02/20/2011    Procedure: HYSTERECTOMY ABDOMINAL;  Surgeon: Trellis Paganini, MD;  Location: [facility];  Service: Gynecology;  Laterality: N/A;  . Salpingoophorectomy 02/20/2011    Procedure: SALPINGO OOPHERECTOMY;  Surgeon: Trellis Paganini, MD;  Location: [facility];  Service: Gynecology;  Laterality: Bilateral;  . Pubovaginal sling 02/20/2011    Procedure: Leonides Grills;  Surgeon: Kathi Ludwig, MD;  Location: [facility];  Service: Urology;  Laterality: N/A;  . Ventral hernia repair 05/24/2011    Procedure: LAPAROSCOPIC VENTRAL HERNIA;  Surgeon: Valarie Merino, MD;  [facility]: [facility];  Service: General;  Laterality: N/A;  . Upper gastrointestinal endoscopy 06/2011   Darnelle Bos, MD No diagnosis found.  Gabrielle Lynch comes in today in about 2 weeks ago she had an episode of pre-significant constipation. She also had some right-sided abdominal pain in the subsequent cleared after MiraLAX and milk of magnesia gave her some relief. She is not able to drink quite as much and when she had the Norr virus  several months ago she was told she might have  a hiatal hernia. At times in the past when she was overfilled she had some esophageal distention.  I want to check her for a slip.   I will try go ahead and schedule her for an upper GI series and then we'll see her back after that possibly to take some fluid out of her band Matt B. Daphine Deutscher, MD, [facility] [phone] beeper [phone]  03/21/2012 12:43 PM

## 2012-03-25 ENCOUNTER — Ambulatory Visit (INDEPENDENT_AMBULATORY_CARE_PROVIDER_SITE_OTHER): Payer: BC Managed Care – PPO | Admitting: Surgery

## 2012-03-25 ENCOUNTER — Encounter (INDEPENDENT_AMBULATORY_CARE_PROVIDER_SITE_OTHER): Payer: Self-pay | Admitting: Surgery

## 2012-03-25 ENCOUNTER — Ambulatory Visit (HOSPITAL_COMMUNITY)
Admission: RE | Admit: 2012-03-25 | Discharge: 2012-03-25 | Disposition: A | Discharge status: Home / Self Care | Payer: BC Managed Care – PPO | Source: Ambulatory Visit | Attending: Surgery | Admitting: Surgery

## 2012-03-25 VITALS — BP 130/70 | Ht 65.0 in | Wt 194.1 lb

## 2012-03-25 DIAGNOSIS — Z9884 Bariatric surgery status: Secondary | ICD-10-CM

## 2012-03-25 DIAGNOSIS — R109 Unspecified abdominal pain: Secondary | ICD-10-CM | POA: Insufficient documentation

## 2012-03-25 NOTE — Patient Instructions (Addendum)
Thanks for your patience.  If you need further assistance after leaving the office, please call our office and speak with a [facility] nurse.  [phone].  If you want to leave a message for Dr. Gibson Telleria, please call his office phone at [phone]. 

## 2012-03-25 NOTE — Progress Notes (Signed)
Gabrielle Lynch Body mass index is 32.30 kg/(m^2).  Having regurgitation:  yes  Nocturnal reflux?  yes  Amount of fill  -0.5  UGI this morning reviewed.  Band in good position but a little too tight.  0.5 cc removed from band.  I told her to call me is she is still too tight.

## 2012-04-16 ENCOUNTER — Ambulatory Visit (INDEPENDENT_AMBULATORY_CARE_PROVIDER_SITE_OTHER): Payer: BC Managed Care – PPO | Admitting: Psychology

## 2012-04-16 DIAGNOSIS — F4320 Adjustment disorder, unspecified: Secondary | ICD-10-CM

## 2012-04-19 ENCOUNTER — Encounter (INDEPENDENT_AMBULATORY_CARE_PROVIDER_SITE_OTHER): Payer: BC Managed Care – PPO | Admitting: Surgery

## 2012-08-09 ENCOUNTER — Ambulatory Visit (INDEPENDENT_AMBULATORY_CARE_PROVIDER_SITE_OTHER): Payer: BC Managed Care – PPO | Admitting: Gynecology

## 2012-08-09 ENCOUNTER — Encounter: Payer: Self-pay | Admitting: Gynecology

## 2012-08-09 VITALS — BP 124/74 | Ht 64.75 in | Wt 193.0 lb

## 2012-08-09 DIAGNOSIS — E039 Hypothyroidism, unspecified: Secondary | ICD-10-CM

## 2012-08-09 DIAGNOSIS — N950 Postmenopausal bleeding: Secondary | ICD-10-CM

## 2012-08-09 DIAGNOSIS — L293 Anogenital pruritus, unspecified: Secondary | ICD-10-CM

## 2012-08-09 DIAGNOSIS — Z780 Asymptomatic menopausal state: Secondary | ICD-10-CM

## 2012-08-09 DIAGNOSIS — Z8639 Personal history of other endocrine, nutritional and metabolic disease: Secondary | ICD-10-CM | POA: Insufficient documentation

## 2012-08-09 DIAGNOSIS — Z01419 Encounter for gynecological examination (general) (routine) without abnormal findings: Secondary | ICD-10-CM

## 2012-08-09 DIAGNOSIS — N898 Other specified noninflammatory disorders of vagina: Secondary | ICD-10-CM

## 2012-08-09 MED ORDER — CLINDAMYCIN PHOSPHATE 2 % VA CREA: 1.0000 | TOPICAL_CREAM | Freq: Every day | VAGINAL | Status: DC

## 2012-08-09 NOTE — Patient Instructions (Signed)
BRCA-1 and BRCA-2 BRCA-1 and BRCA-2 are 2 genes that are linked with hereditary breast and ovarian cancers. About 200,000 women are diagnosed with invasive breast cancer each year and about 23,000 with ovarian cancer (according to the American Cancer Society). Of these cancers, about 5% to 10% will be due to a mutation in one of the BRCA genes. Men can also inherit an increased risk of developing breast cancer, primarily from an alteration in the BRCA-2 gene.  Individuals with mutations in BRCA1 or BRCA2 have significantly elevated risks for breast cancer (up to 80% lifetime risk), ovarian cancer (up to 40% lifetime risk), bilateral breast cancer and other types of cancers. BRCA mutations are inherited and passed from generation to generation. One half of the time, they are passed from the father's side of the family.  The DNA in white blood cells is used to detect mutations in the BRCA genes. While the gene products (proteins) of the BRCA genes act only in breast and ovarian tissue, the genes are present in every cell of the body and blood is the most easily accessible source of that DNA. PREPARATION FOR TEST The test for BRCA mutations is done on a blood sample collected by needle from a vein in the arm. The test does not require surgical biopsy of breast or ovarian tissue.  NORMAL FINDINGS No genetic mutations. Ranges for normal findings may vary among different laboratories and hospitals. You should always check with your doctor after having lab work or other tests done to discuss the meaning of your test results and whether your values are considered within normal limits. MEANING OF TEST  Your caregiver will go over the test results with you and discuss the importance and meaning of your results, as well as treatment options and the need for additional tests if necessary. OBTAINING THE TEST RESULTS It is your responsibility to obtain your test results. Ask the lab or department performing the test  when and how you will get your results. OTHER THINGS TO KNOW Your test results may have implications for other family members. When one member of a family is tested for BRCA mutations, issues often arise about how or whether to share this information with other family members. Seek advice from a genetic counselor about communication of result with your family members.  Pre and post test consultation with a health care provider knowledgeable about genetic testing cannot be overemphasized.  There are many issues to be considered when preparing for a genetic test and upon learning the results, and a genetic counselor has the knowledge and experience to help you sort through them.  If the BRCA test is positive, the options include increased frequency of check-ups (e.g., mammography, blood tests for CA-125, or transvaginal ultrasonography); medications that could reduce risk (e.g., oral contraceptives or tamoxifen); or surgical removal of the ovaries or breasts. There are a number of variables involved and it is important to discuss your options with your doctor and genetic counselor. Research studies have reported that for every 1000 women negative for BRCA mutations, between 12 and 45 of them will develop breast cancer by age 50 and between 3 and 4 will develop ovarian cancer by age 50. The risk increases with age. The test can be ordered by a doctor, preferably by one who can also offer genetic counseling. The blood sample will be sent to a laboratory that specializes in BRCA testing. The American Society of Clinical Oncology and the National Breast Cancer Coalition encourage women seeking the   test to participate in long-term outcome studies to help gather information on the effectiveness of different check-up and treatment options. Document Released: 05/11/2004 Document Revised: 07/10/2011 Document Reviewed: 03/23/2008 [facility] Patient Information 2013 [place], [place].

## 2012-08-09 NOTE — Progress Notes (Signed)
Gabrielle Lynch Jun 06, 1950 [id number]   History:    62 y.o.  for annual gyn exam with complaint of vaginal itching and discomfort. Patient had stated that she's had some pinkish discharge for several months but didn't have much attention to it. Review of her record indicated that in 2012 she had a total abdominal hysterectomy with bilateral salpingo-oophorectomy with concurrent suburethral sling Virgina Jock). The sling procedure was done by Dr. Patsi Sears urologist. Dr. Theressa Millard is patient primary physician who has been doing her lab work. Patient has been on Femring 0.05 vaginal application every 3 months. Patient is currently on vitamin D 50,000 units weekly for vitamin D deficiency started by her primary physician. Review of her record indicated that she is overdue for bone density study her last study was in 2009. Dr. Laurene Footman is her endocrinologist who is been monitoring her hypothyroidism and is also been doing her lab work. Patient had a negative colonoscopy 2 years ago. Patient's father had history of benign colon polyps. Patient is on a five-year colonoscopy cycle. Patient and 1992 had a LEEP cervical conization for dysplasia.  Patient had a lap banding in 2012 Patient had ventral hernia repair 2013  Past medical history,surgical history, family history and social history were all reviewed and documented in the EPIC chart.  Gynecologic History No LMP recorded. Patient has had a hysterectomy. Contraception: status post hysterectomy Last Pap: 2011. Results were: normal Last mammogram: 2013. Results were: normal  Obstetric History OB History   Grav Para Term Preterm Abortions TAB SAB Ect Mult Living   2 2        2      # Outc Date GA Lbr Len/2nd Wgt Sex Del Anes PTL Lv   1 PAR            2 PAR                ROS: A ROS was performed and pertinent positives and negatives are included in the history.  GENERAL: No fevers or chills. HEENT: No change in vision, no earache, sore throat  or sinus congestion. NECK: No pain or stiffness. CARDIOVASCULAR: No chest pain or pressure. No palpitations. PULMONARY: No shortness of breath, cough or wheeze. GASTROINTESTINAL: No abdominal pain, nausea, vomiting or diarrhea, melena or bright red blood per rectum. GENITOURINARY: No urinary frequency, urgency, hesitancy or dysuria. MUSCULOSKELETAL: No joint or muscle pain, no back pain, no recent trauma. DERMATOLOGIC: No rash, no itching, no lesions. ENDOCRINE: No polyuria, polydipsia, no heat or cold intolerance. No recent change in weight. HEMATOLOGICAL: No anemia or easy bruising or bleeding. NEUROLOGIC: No headache, seizures, numbness, tingling or weakness. PSYCHIATRIC: No depression, no loss of interest in normal activity or change in sleep pattern.     Exam: chaperone present  BP 124/74  Ht 5' 4.75" (1.645 m)  Wt 193 lb (87.544 kg)  BMI 32.35 kg/m2  Body mass index is 32.35 kg/(m^2).  General appearance : Well developed well nourished female. No acute distress HEENT: Neck supple, trachea midline, no carotid bruits, no thyroidmegaly Lungs: Clear to auscultation, no rhonchi or wheezes, or rib retractions  Heart: Regular rate and rhythm, no murmurs or gallops Breast:Examined in sitting and supine position were symmetrical in appearance, no palpable masses or tenderness,  no skin retraction, no nipple inversion, no nipple discharge, no skin discoloration, no axillary or supraclavicular lymphadenopathy Abdomen: no palpable masses or tenderness, no rebound or guarding Extremities: no edema or skin discoloration or tenderness  Pelvic:  Bartholin,  Urethra, Skene Glands: Within normal limits             Vagina: friable polypoid-like lesion vaginal cuff 9:00 Cervix:: Absent  Uterus absent  Adnexa  Without masses or tenderness  Anus and perineum  normal   Rectovaginal  normal sphincter tone without palpated masses or tenderness             Hemoccult Primary physician   Wet prep many WBCs  many bacteria  Assessment/Plan:  62 y.o. female for annual exam with incidental finding during exam of a vaginal cuff friable polypoid-like lesion. Silver nitrate was applied to contain the oozing. Patient will be started on Cleocin vaginal cream each bedtime for 5-7 days. She will return back to the office for colposcopic evaluation and excision. Patient was given a prescription to have her shingles vaccine. She also will need to schedule her bone density in the next few weeks. Later this year she needs her mammogram. We will do the Pap smear when she returns at time of the excision site she was oozing today. Patient was reminded on the importance of calcium vitamin D for osteoporosis prevention.    Ok Edwards MD, 5:44 PM 08/09/2012

## 2012-08-15 ENCOUNTER — Encounter: Payer: Self-pay | Admitting: Obstetrics and Gynecology

## 2012-08-15 ENCOUNTER — Ambulatory Visit (INDEPENDENT_AMBULATORY_CARE_PROVIDER_SITE_OTHER): Payer: BC Managed Care – PPO

## 2012-08-15 DIAGNOSIS — Z13820 Encounter for screening for osteoporosis: Secondary | ICD-10-CM

## 2012-08-15 DIAGNOSIS — Z780 Asymptomatic menopausal state: Secondary | ICD-10-CM

## 2012-08-27 ENCOUNTER — Encounter: Payer: Self-pay | Admitting: Gynecology

## 2012-08-27 ENCOUNTER — Ambulatory Visit (INDEPENDENT_AMBULATORY_CARE_PROVIDER_SITE_OTHER): Payer: BC Managed Care – PPO | Admitting: Gynecology

## 2012-08-27 ENCOUNTER — Other Ambulatory Visit (HOSPITAL_COMMUNITY)
Admission: RE | Admit: 2012-08-27 | Discharge: 2012-08-27 | Disposition: A | Discharge status: Home / Self Care | Payer: BC Managed Care – PPO | Source: Ambulatory Visit | Attending: Gynecology | Admitting: Gynecology

## 2012-08-27 VITALS — BP 126/74

## 2012-08-27 DIAGNOSIS — Z1272 Encounter for screening for malignant neoplasm of vagina: Secondary | ICD-10-CM

## 2012-08-27 DIAGNOSIS — N760 Acute vaginitis: Secondary | ICD-10-CM

## 2012-08-27 DIAGNOSIS — N950 Postmenopausal bleeding: Secondary | ICD-10-CM

## 2012-08-27 DIAGNOSIS — Z01419 Encounter for gynecological examination (general) (routine) without abnormal findings: Secondary | ICD-10-CM | POA: Insufficient documentation

## 2012-08-27 DIAGNOSIS — Z1151 Encounter for screening for human papillomavirus (HPV): Secondary | ICD-10-CM | POA: Insufficient documentation

## 2012-08-27 DIAGNOSIS — N898 Other specified noninflammatory disorders of vagina: Secondary | ICD-10-CM

## 2012-08-27 MED ORDER — NYSTATIN-TRIAMCINOLONE 100000-0.1 UNIT/GM-% EX CREA: TOPICAL_CREAM | Freq: Three times a day (TID) | CUTANEOUS | Status: DC

## 2012-08-27 NOTE — Patient Instructions (Addendum)
Monilial Vaginitis Vaginitis in a soreness, swelling and redness (inflammation) of the vagina and vulva. Monilial vaginitis is not a sexually transmitted infection. CAUSES  Yeast vaginitis is caused by yeast (candida) that is normally found in your vagina. With a yeast infection, the candida has overgrown in number to a point that upsets the chemical balance. SYMPTOMS   White, thick vaginal discharge.  Swelling, itching, redness and irritation of the vagina and possibly the lips of the vagina (vulva).  Burning or painful urination.  Painful intercourse. DIAGNOSIS  Things that may contribute to monilial vaginitis are:  Postmenopausal and virginal states.  Pregnancy.  Infections.  Being tired, sick or stressed, especially if you had monilial vaginitis in the past.  Diabetes. Good control will help lower the chance.  Birth control pills.  Tight fitting garments.  Using bubble bath, feminine sprays, douches or deodorant tampons.  Taking certain medications that kill germs (antibiotics).  Sporadic recurrence can occur if you become ill. TREATMENT  Your caregiver will give you medication.  There are several kinds of anti monilial vaginal creams and suppositories specific for monilial vaginitis. For recurrent yeast infections, use a suppository or cream in the vagina 2 times a week, or as directed.  Anti-monilial or steroid cream for the itching or irritation of the vulva may also be used. Get your caregiver's permission.  Painting the vagina with methylene blue solution may help if the monilial cream does not work.  Eating yogurt may help prevent monilial vaginitis. HOME CARE INSTRUCTIONS   Finish all medication as prescribed.  Do not have sex until treatment is completed or after your caregiver tells you it is okay.  Take warm sitz baths.  Do not douche.  Do not use tampons, especially scented ones.  Wear cotton underwear.  Avoid tight pants and panty  hose.  Tell your sexual partner that you have a yeast infection. They should go to their caregiver if they have symptoms such as mild rash or itching.  Your sexual partner should be treated as well if your infection is difficult to eliminate.  Practice safer sex. Use condoms.  Some vaginal medications cause latex condoms to fail. Vaginal medications that harm condoms are:  Cleocin cream.  Butoconazole (Femstat).  Terconazole (Terazol) vaginal suppository.  Miconazole (Monistat) (may be purchased over the counter). SEEK MEDICAL CARE IF:   You have a temperature by mouth above 102 F (38.9 C).  The infection is getting worse after 2 days of treatment.  The infection is not getting better after 3 days of treatment.  You develop blisters in or around your vagina.  You develop vaginal bleeding, and it is not your menstrual period.  You have pain when you urinate.  You develop intestinal problems.  You have pain with sexual intercourse. Document Released: 01/25/2005 Document Revised: 07/10/2011 Document Reviewed: 10/09/2008 Exit[employer] Patient Information 2013 [employer].  

## 2012-08-27 NOTE — Progress Notes (Signed)
Gabrielle Lynch is an 62 y.o. female. Who was seen in the office on April 11 for annual gynecological examination and had stated that for several months she was having a pinkish discharge vaginal.Review of her record indicated that in 2012 she had a total abdominal hysterectomy with bilateral salpingo-oophorectomy with concurrent suburethral sling ([place]). The sling procedure was done by Dr. Tannenbaum urologist. She had presented to the office today not only for her Pap smear and also for colposcopic evaluation and excision of the LEEP technique of this fleshy like structure at the vaginal cuff that was seen during the annual exam. She underwent a complete detail colposcopic evaluation today. After applying Betadine solution to the lesion 1% lidocaine was intended to be applied to the base before removal of the lesion via LEEP but patient became very uneasy and jumped almost off the table and it was decided to do this in an outpatient surgical setting.    Pertinent Gynecological History: Menses: post-menopausal Bleeding: post menopausal bleeding Contraception: status post hysterectomy DES exposure: denies Blood transfusions: none Sexually transmitted diseases: no past history Previous GYN Procedures: See above  Last mammogram: normal Date: 2013 Last pap: normal Date: 2011 OB History: G2, P2   Menstrual History: Menarche age: 13  No LMP recorded. Patient has had a hysterectomy.    Past Medical History  Diagnosis Date  . 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  STATES SHE CARRIES EPI PEN--BUT HAS NOT HAD ANY RECENT EPISODES.  STATES MANY OF THE MEDICATIONS SHE TAKES ARE TO HELP THIS PROBLEM - INCLUDING  Ketotifen-mast cell inhibitor-not approved in [place].   . PONV (postoperative nausea and vomiting)   . Hypertension     past HISTORY OF HYPERTENSION -off b/p meds for about 2 yrs  . Cold     slight head congestion-cold,  no fever or chest congestion  . Hypothyroidism   . Headache     migraines  . History of ventral hernia     Past Surgical History  Procedure Laterality Date  . Cervical biopsy  w/ loop electrode excision  1992  . Hysteroscopy  06/1995    HYSTEROSCOPIC MYOMECTOMY  . Hysteroscopy  06/1998    D&C, HYSTEROSCOPY  . Dilation and curettage of uterus  06/1998    D&C, HYSTEROSCOPY  . Cholecystectomy    . Knee surgery      X2 right knee  . Laparoscopic gastric banding  2010  . Cesarean section      X2  . Abdominal hysterectomy  02/20/2011    Procedure: HYSTERECTOMY ABDOMINAL;  Surgeon: Daniel L Gottsegen, MD;  Location: [facility];  Service: Gynecology;  Laterality: N/A;  . Salpingoophorectomy  02/20/2011    Procedure: SALPINGO OOPHERECTOMY;  Surgeon: Daniel L Gottsegen, MD;  Location: [facility];  Service: Gynecology;  Laterality: Bilateral;  . Pubovaginal sling  02/20/2011    Procedure: PUBO-VAGINAL SLING;  Surgeon: Sigmund I Tannenbaum, MD;  Location: [facility];  Service: Urology;  Laterality: N/A;  . Ventral hernia repair  05/24/2011    Procedure: LAPAROSCOPIC VENTRAL HERNIA;  Surgeon: Matthew B Martin, MD;  Location: [facility];  Service: General;  Laterality: N/A;  . Upper gastrointestinal endoscopy  06/2011    Family History  Problem Relation Age of Onset  . Breast cancer Mother     60's  . Hypertension Mother   . Cancer Father       LUNG    Social History:  reports that she has never smoked. She does not have any smokeless tobacco history on file. She reports that  drinks alcohol. She reports that she does not use illicit drugs.  Allergies:  Allergies  Allergen Reactions  . Demerol Nausea Only     (Not in a hospital admission)  REVIEW OF SYSTEMS: A ROS was performed and pertinent positives and negatives are included in the history.  GENERAL: No fevers or chills. HEENT: No change in vision, no earache, sore throat or sinus congestion. NECK: No pain or  stiffness. CARDIOVASCULAR: No chest pain or pressure. No palpitations. PULMONARY: No shortness of breath, cough or wheeze. GASTROINTESTINAL: No abdominal pain, nausea, vomiting or diarrhea, melena or bright red blood per rectum. GENITOURINARY: No urinary frequency, urgency, hesitancy or dysuria. MUSCULOSKELETAL: No joint or muscle pain, no back pain, no recent trauma. DERMATOLOGIC: No rash, no itching, no lesions. ENDOCRINE: No polyuria, polydipsia, no heat or cold intolerance. No recent change in weight. HEMATOLOGICAL: No anemia or easy bruising or bleeding. NEUROLOGIC: No headache, seizures, numbness, tingling or weakness. PSYCHIATRIC: No depression, no loss of interest in normal activity or change in sleep pattern.   Postcoital bleeding  Blood pressure 126/74.  Physical Exam:  HEENT:unremarkable Neck:Supple, midline, no thyroid megaly, no carotid bruits Lungs:  Clear to auscultation no rhonchi's or wheezes Heart:Regular rate and rhythm, no murmurs or gallops Breast Exam:examined a few weeks ago normal Abdomen:soft nontender no rebound or guarding Pelvic:BUSwithin normal limits Vagina:intact except for a fleshy-like lesion at the 9:00  position of the vaginal cuff Cervix:absent Uterus:absent Adnexa:no palpable masses or tenderness Extremities: No cords, no edema Rectal:not examined  No results found for this or any previous visit (from the past 24 hour(s)).  No results found.  Assessment/Plan: Patient with postcoital bleeding postmenopausal prior hysterectomy was found to have a fleshy-like lesion of the vaginal cuff at the 9:00 position. Patient uncomfortable when attempted to be removed in the office. She will be scheduled in the surgical ambulatory setting to excise it. The risk of the procedure were as follows:                        Patient was counseled as to the risk of surgery to include the following:  1. Infection (prohylactic antibiotics will be administered)  2.  DVT/Pulmonary Embolism (prophylactic pneumo compression stockings will be used)  3.Trauma to internal organs requiring additional surgical procedure to repair any injury to     Internal organs requiring perhaps additional hospitalization days.  4.Hemmorhage requiring transfusion and blood products which carry risks such as anaphylactic reaction, hepatitis and AIDS  Patient had received literature information on the procedure scheduled and all her questions were answered and fully accepts all risk.  Kriston Mckinnie HMD5:20 PMTD@  Geanie Pacifico H 08/27/2012, 5:13 PM  

## 2012-08-29 ENCOUNTER — Encounter (INDEPENDENT_AMBULATORY_CARE_PROVIDER_SITE_OTHER): Payer: Self-pay | Admitting: Surgery

## 2012-08-29 ENCOUNTER — Ambulatory Visit (INDEPENDENT_AMBULATORY_CARE_PROVIDER_SITE_OTHER): Payer: BC Managed Care – PPO | Admitting: Surgery

## 2012-08-29 VITALS — BP 140/88 | HR 68 | Temp 99.0°F | Resp 16 | Ht 65.0 in | Wt 188.6 lb

## 2012-08-29 DIAGNOSIS — Z9884 Bariatric surgery status: Secondary | ICD-10-CM

## 2012-08-29 DIAGNOSIS — Z4651 Encounter for fitting and adjustment of gastric lap band: Secondary | ICD-10-CM

## 2012-08-29 NOTE — Progress Notes (Signed)
Lapband Fill Encounter Problem List:   Patient Active Problem List   Diagnosis Date Noted  . H/O vitamin D deficiency 08/09/2012  . Unspecified hypothyroidism 08/09/2012  . Menopause 08/09/2012  . Postoperative seroma-in ventral hernia repair site 07/20/2011  . Lapband APS April 2010 07/20/2011  . Cervical dysplasia   . Fibroid   . Hypertension   . Mast cell disorder   . DEGENERATIVE JOINT DISEASE 02/19/2008  . SPONDYLOSIS UNSPEC SITE W/O MENTION MYELOPATHY 02/19/2008  . SINUS TARSI SYNDROME 02/19/2008  . UNEQUAL LEG LENGTH 02/19/2008    [name] Body mass index is 31.38 kg/(m^2). Weight loss since surgery  41  Having regurgitation?:  no  Feel that they need a fill?  yes  Nocturnal reflux?  no  Amount of fill  0.25    Port site: OK   Instructions given and weight loss goals discussed.  She is having abdominoplasty per Delia Chimes on June 18 at the [facility] and wanted me to be there to assist with the port if needed.  I have asked Coy Saunas. To place that on my schedule and move my hernia case to 10 am.      Matt B. Daphine Deutscher, MD, FACS

## 2012-08-29 NOTE — Patient Instructions (Signed)

## 2012-08-30 ENCOUNTER — Encounter (HOSPITAL_BASED_OUTPATIENT_CLINIC_OR_DEPARTMENT_OTHER): Payer: Self-pay | Admitting: *Deleted

## 2012-08-30 NOTE — Progress Notes (Signed)
NPO AFTER MN WITH WATER/ GATORADE UNTIL 0700. PRE-OP ORDERS PENDING. WILL TAKE NEXIUM, SYNTHROID, AND TOPAMAX AM OF SURG W/ SIPS OF WATER.

## 2012-09-02 ENCOUNTER — Ambulatory Visit (HOSPITAL_BASED_OUTPATIENT_CLINIC_OR_DEPARTMENT_OTHER): Payer: BC Managed Care – PPO | Admitting: Anesthesiology

## 2012-09-02 ENCOUNTER — Ambulatory Visit (HOSPITAL_BASED_OUTPATIENT_CLINIC_OR_DEPARTMENT_OTHER)
Admission: RE | Admit: 2012-09-02 | Discharge: 2012-09-02 | Disposition: A | Discharge status: Home / Self Care | Payer: BC Managed Care – PPO | Source: Ambulatory Visit | Attending: Gynecology | Admitting: Gynecology

## 2012-09-02 ENCOUNTER — Encounter (HOSPITAL_BASED_OUTPATIENT_CLINIC_OR_DEPARTMENT_OTHER): Payer: Self-pay | Admitting: Anesthesiology

## 2012-09-02 ENCOUNTER — Encounter (HOSPITAL_BASED_OUTPATIENT_CLINIC_OR_DEPARTMENT_OTHER)
Admission: RE | Disposition: A | Discharge status: Home / Self Care | Payer: Self-pay | Source: Ambulatory Visit | Attending: Gynecology

## 2012-09-02 DIAGNOSIS — N898 Other specified noninflammatory disorders of vagina: Secondary | ICD-10-CM | POA: Insufficient documentation

## 2012-09-02 DIAGNOSIS — N950 Postmenopausal bleeding: Secondary | ICD-10-CM | POA: Insufficient documentation

## 2012-09-02 DIAGNOSIS — L918 Other hypertrophic disorders of the skin: Secondary | ICD-10-CM | POA: Insufficient documentation

## 2012-09-02 DIAGNOSIS — I10 Essential (primary) hypertension: Secondary | ICD-10-CM | POA: Insufficient documentation

## 2012-09-02 DIAGNOSIS — Z90710 Acquired absence of both cervix and uterus: Secondary | ICD-10-CM | POA: Insufficient documentation

## 2012-09-02 DIAGNOSIS — Z9889 Other specified postprocedural states: Secondary | ICD-10-CM

## 2012-09-02 DIAGNOSIS — E039 Hypothyroidism, unspecified: Secondary | ICD-10-CM | POA: Insufficient documentation

## 2012-09-02 HISTORY — DX: Migraine, unspecified, not intractable, without status migrainosus: G43.909

## 2012-09-02 HISTORY — DX: Unspecified osteoarthritis, unspecified site: M19.90

## 2012-09-02 HISTORY — DX: Personal history of other diseases of the circulatory system: Z86.79

## 2012-09-02 HISTORY — DX: Spondylosis, unspecified: M47.9

## 2012-09-02 HISTORY — PX: LESION REMOVAL: SHX5196

## 2012-09-02 LAB — CBC
MCH: 29.9 pg (ref 26.0–34.0)
MCHC: 33.5 g/dL (ref 30.0–36.0)
Platelets: 261 10*3/uL (ref 150–400)
RDW: 12.9 % (ref 11.5–15.5)

## 2012-09-02 LAB — URINE MICROSCOPIC-ADD ON

## 2012-09-02 LAB — URINALYSIS, ROUTINE W REFLEX MICROSCOPIC
Bilirubin Urine: NEGATIVE
Ketones, ur: NEGATIVE mg/dL
Nitrite: NEGATIVE
Protein, ur: NEGATIVE mg/dL

## 2012-09-02 SURGERY — EXCISION, LESION, VAGINA
Anesthesia: General | Site: Vagina | Wound class: Clean Contaminated

## 2012-09-02 MED ORDER — LACTATED RINGERS IV SOLN
INTRAVENOUS | Status: DC
  Administered 2012-09-02: 12:00:00 via INTRAVENOUS
  Filled 2012-09-02: qty 1000

## 2012-09-02 MED ORDER — ESTRADIOL 0.1 MG/GM VA CREA: 2.0000 g | TOPICAL_CREAM | Freq: Every day | VAGINAL | Status: DC

## 2012-09-02 MED ORDER — KETOROLAC TROMETHAMINE 30 MG/ML IJ SOLN
INTRAMUSCULAR | Status: DC | PRN
  Administered 2012-09-02: 30 mg via INTRAVENOUS

## 2012-09-02 MED ORDER — LIDOCAINE-EPINEPHRINE (PF) 1 %-1:200000 IJ SOLN
INTRAMUSCULAR | Status: DC | PRN
  Administered 2012-09-02: 10 mL

## 2012-09-02 MED ORDER — LACTATED RINGERS IV SOLN
INTRAVENOUS | Status: DC
  Filled 2012-09-02: qty 1000

## 2012-09-02 MED ORDER — FENTANYL CITRATE 0.05 MG/ML IJ SOLN
25.0000 ug | INTRAMUSCULAR | Status: DC | PRN
  Filled 2012-09-02: qty 1

## 2012-09-02 MED ORDER — LIDOCAINE HCL (CARDIAC) 20 MG/ML IV SOLN
INTRAVENOUS | Status: DC | PRN
  Administered 2012-09-02: 80 mg via INTRAVENOUS

## 2012-09-02 MED ORDER — PROMETHAZINE HCL 25 MG/ML IJ SOLN
6.2500 mg | INTRAMUSCULAR | Status: DC | PRN
  Filled 2012-09-02: qty 1

## 2012-09-02 MED ORDER — PROPOFOL 10 MG/ML IV BOLUS
INTRAVENOUS | Status: DC | PRN
  Administered 2012-09-02: 50 mg via INTRAVENOUS
  Administered 2012-09-02: 200 mg via INTRAVENOUS

## 2012-09-02 MED ORDER — MIDAZOLAM HCL 5 MG/5ML IJ SOLN
INTRAMUSCULAR | Status: DC | PRN
  Administered 2012-09-02: 2 mg via INTRAVENOUS

## 2012-09-02 MED ORDER — ONDANSETRON HCL 4 MG/2ML IJ SOLN
INTRAMUSCULAR | Status: DC | PRN
  Administered 2012-09-02: 4 mg via INTRAVENOUS

## 2012-09-02 MED ORDER — DEXAMETHASONE SODIUM PHOSPHATE 4 MG/ML IJ SOLN
INTRAMUSCULAR | Status: DC | PRN
  Administered 2012-09-02: 10 mg via INTRAVENOUS

## 2012-09-02 MED ORDER — FENTANYL CITRATE 0.05 MG/ML IJ SOLN
INTRAMUSCULAR | Status: DC | PRN
  Administered 2012-09-02 (×2): 50 ug via INTRAVENOUS

## 2012-09-02 MED ORDER — SCOPOLAMINE 1 MG/3DAYS TD PT72
1.0000 | MEDICATED_PATCH | TRANSDERMAL | Status: DC
  Administered 2012-09-02: 1.5 mg via TRANSDERMAL
  Filled 2012-09-02: qty 1

## 2012-09-02 MED ORDER — FERRIC SUBSULFATE SOLN
Status: DC | PRN
  Administered 2012-09-02: 1

## 2012-09-02 SURGICAL SUPPLY — 43 items
APL SKNCLS STERI-STRIP NONHPOA (GAUZE/BANDAGES/DRESSINGS)
BENZOIN TINCTURE PRP APPL 2/3 (GAUZE/BANDAGES/DRESSINGS) IMPLANT
BLADE SURG 15 STRL LF DISP TIS (BLADE) ×1 IMPLANT
BLADE SURG 15 STRL SS (BLADE) ×2
CANISTER SUCTION 1200CC (MISCELLANEOUS) IMPLANT
CANISTER SUCTION 2500CC (MISCELLANEOUS) ×1 IMPLANT
CLOTH BEACON ORANGE TIMEOUT ST (SAFETY) ×2 IMPLANT
COVER TABLE BACK 60X90 (DRAPES) ×2 IMPLANT
DRAPE LG THREE QUARTER DISP (DRAPES) ×2 IMPLANT
DRAPE UNDERBUTTOCKS STRL (DRAPE) ×2 IMPLANT
ELECT BALL LEEP 3MM BLK (ELECTRODE) ×1 IMPLANT
ELECT LOOP LLETZ 10X10 DISP (CUTTING LOOP) ×2
ELECT NDL TIP 2.8 STRL (NEEDLE) IMPLANT
ELECT NEEDLE TIP 2.8 STRL (NEEDLE) IMPLANT
ELECT REM PT RETURN 9FT ADLT (ELECTROSURGICAL) ×2
ELECTRODE LOOP LTZ 10X10 DISP (CUTTING LOOP) IMPLANT
ELECTRODE REM PT RTRN 9FT ADLT (ELECTROSURGICAL) ×1 IMPLANT
GLOVE BIO SURGEON STRL SZ 6.5 (GLOVE) ×4 IMPLANT
GLOVE BIO SURGEON STRL SZ7.5 (GLOVE) ×1 IMPLANT
GLOVE BIOGEL PI IND STRL 6.5 (GLOVE) IMPLANT
GLOVE BIOGEL PI INDICATOR 6.5 (GLOVE) ×1
GLOVE ECLIPSE 6.5 STRL STRAW (GLOVE) ×1 IMPLANT
GLOVE ECLIPSE 7.5 STRL STRAW (GLOVE) ×2 IMPLANT
GLOVE INDICATOR 8.0 STRL GRN (GLOVE) ×2 IMPLANT
GOWN PREVENTION PLUS LG XLONG (DISPOSABLE) ×3 IMPLANT
GOWN STRL REIN XL XLG (GOWN DISPOSABLE) ×2 IMPLANT
LEGGING LITHOTOMY PAIR STRL (DRAPES) ×2 IMPLANT
NS IRRIG 500ML POUR BTL (IV SOLUTION) IMPLANT
PACK BASIN DAY SURGERY FS (CUSTOM PROCEDURE TRAY) ×2 IMPLANT
PAD OB MATERNITY 4.3X12.25 (PERSONAL CARE ITEMS) ×2 IMPLANT
PAD PREP 24X48 CUFFED NSTRL (MISCELLANEOUS) ×2 IMPLANT
PENCIL BUTTON HOLSTER BLD 10FT (ELECTRODE) ×2 IMPLANT
STRIP CLOSURE SKIN 1/4X4 (GAUZE/BANDAGES/DRESSINGS) IMPLANT
SUT MNCRL AB 3-0 PS2 18 (SUTURE) IMPLANT
SUT PLAIN 3 0 FS 2 27 (SUTURE) IMPLANT
SWAB CULTURE LIQ STUART DBL (MISCELLANEOUS) IMPLANT
SYR BULB IRRIGATION 50ML (SYRINGE) IMPLANT
TOWEL OR 17X24 6PK STRL BLUE (TOWEL DISPOSABLE) ×4 IMPLANT
TRAY DSU PREP LF (CUSTOM PROCEDURE TRAY) ×2 IMPLANT
TUBE ANAEROBIC SPECIMEN COL (MISCELLANEOUS) ×1 IMPLANT
TUBE CONNECTING 12X1/4 (SUCTIONS) IMPLANT
WATER STERILE IRR 500ML POUR (IV SOLUTION) ×2 IMPLANT
YANKAUER SUCT BULB TIP NO VENT (SUCTIONS) IMPLANT

## 2012-09-02 NOTE — Transfer of Care (Signed)
Immediate Anesthesia Transfer of Care Note  Patient: Gabrielle Lynch  Procedure(s) Performed: Procedure(s) with comments: EXCISION VAGINAL LESION (N/A) - cpt 57100  one hour  Patient Location: PACU  Anesthesia Type:General  Level of Consciousness: awake and oriented  Airway & Oxygen Therapy: Patient Spontanous Breathing and Patient connected to nasal cannula oxygen  Post-op Assessment: Report given to PACU RN  Post vital signs: Reviewed and stable  Complications: No apparent anesthesia complications

## 2012-09-02 NOTE — Anesthesia Procedure Notes (Signed)
Procedure Name: LMA Insertion Date/Time: 09/02/2012 12:41 PM Performed by: Maris Berger T Pre-anesthesia Checklist: Patient identified, Emergency Drugs available, Suction available and Patient being monitored Patient Re-evaluated:Patient Re-evaluated prior to inductionOxygen Delivery Method: Circle System Utilized Preoxygenation: Pre-oxygenation with 100% oxygen Intubation Type: IV induction Ventilation: Mask ventilation without difficulty LMA: LMA inserted LMA Size: 4.0 Number of attempts: 1 Airway Equipment and Method: bite block Placement Confirmation: positive ETCO2 Tube secured with: Tape Dental Injury: Teeth and Oropharynx as per pre-operative assessment

## 2012-09-02 NOTE — H&P (View-Only) (Signed)
Gabrielle Lynch is an 62 y.o. female. Who was seen in the office on April 11 for annual gynecological examination and had stated that for several months she was having a pinkish discharge vaginal.Review of her record indicated that in 2012 she had a total abdominal hysterectomy with bilateral salpingo-oophorectomy with concurrent suburethral sling Virgina Jock). The sling procedure was done by Dr. Patsi Sears urologist. She had presented to the office today not only for her Pap smear and also for colposcopic evaluation and excision of the LEEP technique of this fleshy like structure at the vaginal cuff that was seen during the annual exam. She underwent a complete detail colposcopic evaluation today. After applying Betadine solution to the lesion 1% lidocaine was intended to be applied to the base before removal of the lesion via LEEP but patient became very uneasy and jumped almost off the table and it was decided to do this in an outpatient surgical setting.    Pertinent Gynecological History: Menses: post-menopausal Bleeding: post menopausal bleeding Contraception: status post hysterectomy DES exposure: denies Blood transfusions: none Sexually transmitted diseases: no past history Previous GYN Procedures: See above  Last mammogram: normal Date: 2013 Last pap: normal Date: 2011 OB History: G2, P2   Menstrual History: Menarche age: 49  No LMP recorded. Patient has had a hysterectomy.    Past Medical History  Diagnosis Date  . 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  STATES SHE CARRIES EPI PEN--BUT HAS NOT HAD ANY RECENT EPISODES.  STATES MANY OF THE MEDICATIONS SHE TAKES ARE TO HELP THIS PROBLEM - INCLUDING  Ketotifen-mast cell inhibitor-not approved in [place].   Marland Kitchen PONV (postoperative nausea and vomiting)   . Hypertension     past HISTORY OF HYPERTENSION -off b/p meds for about 2 yrs  . Cold     slight head congestion-cold,  no fever or chest congestion  . Hypothyroidism   . Headache     migraines  . History of ventral hernia     Past Surgical History  Procedure Laterality Date  . Cervical biopsy  w/ loop electrode excision  1992  . Hysteroscopy  06/1995    HYSTEROSCOPIC MYOMECTOMY  . Hysteroscopy  06/1998    D&C, HYSTEROSCOPY  . Dilation and curettage of uterus  06/1998    D&C, HYSTEROSCOPY  . Cholecystectomy    . Knee surgery      X2 right knee  . Laparoscopic gastric banding  2010  . Cesarean section      X2  . Abdominal hysterectomy  02/20/2011    Procedure: HYSTERECTOMY ABDOMINAL;  Surgeon: Trellis Paganini, MD;  Location: [facility];  Service: Gynecology;  Laterality: N/A;  . Salpingoophorectomy  02/20/2011    Procedure: SALPINGO OOPHERECTOMY;  Surgeon: Trellis Paganini, MD;  Location: [facility];  Service: Gynecology;  Laterality: Bilateral;  . Pubovaginal sling  02/20/2011    Procedure: Leonides Grills;  Surgeon: Kathi Ludwig, MD;  Location: [facility];  Service: Urology;  Laterality: N/A;  . Ventral hernia repair  05/24/2011    Procedure: LAPAROSCOPIC VENTRAL HERNIA;  Surgeon: Valarie Merino, MD;  Location: [facility];  Service: General;  Laterality: N/A;  . Upper gastrointestinal endoscopy  06/2011    Family History  Problem Relation Age of Onset  . Breast cancer Mother     36's  . Hypertension Mother   . Cancer Father  LUNG    Social History:  reports that she has never smoked. She does not have any smokeless tobacco history on file. She reports that  drinks alcohol. She reports that she does not use illicit drugs.  Allergies:  Allergies  Allergen Reactions  . Demerol Nausea Only     (Not in a hospital admission)  REVIEW OF SYSTEMS: A ROS was performed and pertinent positives and negatives are included in the history.  GENERAL: No fevers or chills. HEENT: No change in vision, no earache, sore throat or sinus congestion. NECK: No pain or  stiffness. CARDIOVASCULAR: No chest pain or pressure. No palpitations. PULMONARY: No shortness of breath, cough or wheeze. GASTROINTESTINAL: No abdominal pain, nausea, vomiting or diarrhea, melena or bright red blood per rectum. GENITOURINARY: No urinary frequency, urgency, hesitancy or dysuria. MUSCULOSKELETAL: No joint or muscle pain, no back pain, no recent trauma. DERMATOLOGIC: No rash, no itching, no lesions. ENDOCRINE: No polyuria, polydipsia, no heat or cold intolerance. No recent change in weight. HEMATOLOGICAL: No anemia or easy bruising or bleeding. NEUROLOGIC: No headache, seizures, numbness, tingling or weakness. PSYCHIATRIC: No depression, no loss of interest in normal activity or change in sleep pattern.   Postcoital bleeding  Blood pressure 126/74.  Physical Exam:  HEENT:unremarkable Neck:Supple, midline, no thyroid megaly, no carotid bruits Lungs:  Clear to auscultation no rhonchi's or wheezes Heart:Regular rate and rhythm, no murmurs or gallops Breast Exam:examined a few weeks ago normal Abdomen:soft nontender no rebound or guarding Pelvic:BUSwithin normal limits Vagina:intact except for a fleshy-like lesion at the 9:00  position of the vaginal cuff Cervix:absent Uterus:absent Adnexa:no palpable masses or tenderness Extremities: No cords, no edema Rectal:not examined  No results found for this or any previous visit (from the past 24 hour(s)).  No results found.  Assessment/Plan: Patient with postcoital bleeding postmenopausal prior hysterectomy was found to have a fleshy-like lesion of the vaginal cuff at the 9:00 position. Patient uncomfortable when attempted to be removed in the office. She will be scheduled in the surgical ambulatory setting to excise it. The risk of the procedure were as follows:                        Patient was counseled as to the risk of surgery to include the following:  1. Infection (prohylactic antibiotics will be administered)  2.  DVT/Pulmonary Embolism (prophylactic pneumo compression stockings will be used)  3.Trauma to internal organs requiring additional surgical procedure to repair any injury to     Internal organs requiring perhaps additional hospitalization days.  4.Hemmorhage requiring transfusion and blood products which carry risks such as anaphylactic reaction, hepatitis and AIDS  Patient had received literature information on the procedure scheduled and all her questions were answered and fully accepts all risk.  [facility] HMD5:20 PMTD@  Ok Edwards 08/27/2012, 5:13 PM

## 2012-09-02 NOTE — Interval H&P Note (Signed)
History and Physical Interval Note:  09/02/2012 11:21 AM  Gabrielle Lynch  has presented today for surgery, with the diagnosis of vaginal cuff lesion  The various methods of treatment have been discussed with the patient and family. After consideration of risks, benefits and other options for treatment, the patient has consented to  Procedure(s) with comments: EXCISION VAGINAL LESION (N/A) - cpt 57100  one hour as a surgical intervention .  The patient's history has been reviewed, patient examined, no change in status, stable for surgery.  I have reviewed the patient's chart and labs.  Questions were answered to the patient's satisfaction.     Ok Edwards

## 2012-09-02 NOTE — Op Note (Signed)
09/02/2012  1:53 PM  PATIENT:  Gabrielle Lynch  62 y.o. female  PRE-OPERATIVE DIAGNOSIS:  vaginal cuff lesion  POST-OPERATIVE DIAGNOSIS:  vaginal cuff lesion  PROCEDURE:  Procedure(s): EXCISION VAGINAL LESION/ LEEP technique  SURGEON:  Surgeon(s): Ok Edwards, MD  ANESTHESIA:   general  FINDINGS:fleshy vaginal cuff lesion 9:00 position  DESCRIPTION OF OPERATION: the patient was taken to the operating room where she underwent successful general endotracheal anesthesia. Patient voided before coming to the operating room. She was placed on the high lithotomy position. A time out was undertaken for proper identification of the patient and procedure to be performed. Deaver retractors were placed for exposure prior to this the vagina and perineum were prepped and draped in usual sterile fashion. 1% lidocaine with 1 100,000 epinephrine was infiltrated at the base of the fleshy lesion on the vaginal cuff for a total of 3 cc. The LEEP handheld piece with a small wire loop attachment and the [facility] [employer] on a cutting 40 W on coagulation was utilized to excise this fleshy lesion from the vaginal cuff. The wire loop was exchanged for a small ball electrode and the base of where the fleshy lesion had been removed was cauterized for additional hemostasis. Monsel solution was also applied. The patient received 30 mg of Toradol in route to the recovery room. She was extubated and transferred to recovery room stable vital signs.  ESTIMATED BLOOD LOSS:minimal   Intake/Output Summary (Last 24 hours) at 09/02/12 1353 Last data filed at 09/02/12 1312  Gross per 24 hour  Intake    550 ml  Output      0 ml  Net    550 ml     BLOOD ADMINISTERED:none   LOCAL MEDICATIONS USED:  LIDOCAINE 1% injected vaginal cuff approximately 3 cc  SPECIMEN:  Source of Specimen:  Vaginal cuff  DISPOSITION OF SPECIMEN:  PATHOLOGY  COUNTS:  YES  PLAN OF CARE: Transfer to  PACU  [facility] HMD1:53 PMTD@

## 2012-09-02 NOTE — Anesthesia Preprocedure Evaluation (Signed)
Anesthesia Evaluation  Patient identified by MRN, date of birth, ID band Patient awake    Reviewed: Allergy & Precautions, H&P , NPO status , Patient's Chart, lab work & pertinent test results  History of Anesthesia Complications (+) PONV  Airway Mallampati: II TM Distance: <3 FB Neck ROM: Full    Dental no notable dental hx. (+) Caps,    Pulmonary neg pulmonary ROS,  breath sounds clear to auscultation  Pulmonary exam normal       Cardiovascular hypertension, Rhythm:Regular Rate:Normal     Neuro/Psych  Headaches, negative neurological ROS  negative psych ROS   GI/Hepatic negative GI ROS, Neg liver ROS, GERD-  Medicated,  Endo/Other  Hypothyroidism Mast cell disorder  Renal/GU negative Renal ROS  negative genitourinary   Musculoskeletal negative musculoskeletal ROS (+)   Abdominal   Peds  Hematology negative hematology ROS (+)   Anesthesia Other Findings   Reproductive/Obstetrics negative OB ROS                           Anesthesia Physical Anesthesia Plan  ASA: II  Anesthesia Plan: General   Post-op Pain Management:    Induction: Intravenous  Airway Management Planned: LMA  Additional Equipment:   Intra-op Plan:   Post-operative Plan: Extubation in OR  Informed Consent: I have reviewed the patients History and Physical, chart, labs and discussed the procedure including the risks, benefits and alternatives for the proposed anesthesia with the patient or authorized representative who has indicated his/her understanding and acceptance.   Dental advisory given  Plan Discussed with: CRNA  Anesthesia Plan Comments:         Anesthesia Quick Evaluation

## 2012-09-03 ENCOUNTER — Encounter (HOSPITAL_BASED_OUTPATIENT_CLINIC_OR_DEPARTMENT_OTHER): Payer: Self-pay | Admitting: Gynecology

## 2012-09-03 NOTE — Anesthesia Postprocedure Evaluation (Signed)
Anesthesia Post Note  Patient: Gabrielle Lynch  Procedure(s) Performed: Procedure(s) (LRB): EXCISION VAGINAL LESION (N/A)  Anesthesia type: General  Patient location: PACU  Post pain: Pain level controlled  Post assessment: Post-op Vital signs reviewed  Last Vitals:  Filed Vitals:   09/02/12 1430  BP: 151/84  Pulse: 58  Temp: 36.3 C  Resp:     Post vital signs: Reviewed  Level of consciousness: sedated  Complications: No apparent anesthesia complications

## 2012-09-16 ENCOUNTER — Ambulatory Visit: Payer: BC Managed Care – PPO | Admitting: Gynecology

## 2012-09-17 ENCOUNTER — Ambulatory Visit (INDEPENDENT_AMBULATORY_CARE_PROVIDER_SITE_OTHER): Payer: BC Managed Care – PPO | Admitting: Gynecology

## 2012-09-17 ENCOUNTER — Encounter: Payer: Self-pay | Admitting: Gynecology

## 2012-09-17 VITALS — BP 126/80

## 2012-09-17 DIAGNOSIS — N760 Acute vaginitis: Secondary | ICD-10-CM

## 2012-09-17 DIAGNOSIS — Z9889 Other specified postprocedural states: Secondary | ICD-10-CM

## 2012-09-17 MED ORDER — CLOBETASOL PROPIONATE 0.05 % EX CREA: TOPICAL_CREAM | Freq: Two times a day (BID) | CUTANEOUS | Status: DC

## 2012-09-17 MED ORDER — ESTRADIOL ACETATE 0.05 MG/24HR VA RING: VAGINAL_RING | VAGINAL | Status: DC

## 2012-09-17 NOTE — Progress Notes (Signed)
Patient presented to the office for postop visit. Patient status post excision of vaginal cuff lesion and is doing well. Patient used  Cleocin vaginal cream intravaginally each bedtime for 5 days. She has has at times complaining of vulvar pruritus and recently took Monistat with some minimal relief.  Pathology report: Diagnosis Vagina, cuff lesion - GRANULATION TISSUE. - NO EVIDENCE OF SQUAMOUS EPITHELIUM. - NO ATYPIA OR MALIGNANCY.  Exam: Bartholin urethra Skene was within normal limits Vagina: Vaginal cuff intact previous area excised on the right corner of the vaginal cuff completely healed. Rectal exam: Not done  Assessment/plan: 2 weeks postop status post excision of vaginal lesion which turned out to be granulation tissue. Patient many years ago had a hysterectomy. I'm going to call in a prescription for clobetasol 0.05% to apply to the vulvar area twice a day for 7-10 days and then once a week when necessary. She may insert the Estring  back into her vagina which she was using for hormone support every 3 months.

## 2012-09-18 ENCOUNTER — Other Ambulatory Visit: Payer: Self-pay | Admitting: Endocrinology

## 2012-09-18 DIAGNOSIS — E039 Hypothyroidism, unspecified: Secondary | ICD-10-CM

## 2012-09-30 ENCOUNTER — Ambulatory Visit
Admission: RE | Admit: 2012-09-30 | Discharge: 2012-09-30 | Disposition: A | Discharge status: Home / Self Care | Payer: BC Managed Care – PPO | Source: Ambulatory Visit | Attending: Endocrinology | Admitting: Endocrinology

## 2012-09-30 DIAGNOSIS — E039 Hypothyroidism, unspecified: Secondary | ICD-10-CM

## 2012-12-03 ENCOUNTER — Telehealth (INDEPENDENT_AMBULATORY_CARE_PROVIDER_SITE_OTHER): Payer: Self-pay

## 2012-12-03 NOTE — Telephone Encounter (Signed)
LMOM letting pt know that I have scheduled her LBF appt with Dr. Daphine Deutscher for [facility] Aug. 6 @ 830am per Dr. Daphine Deutscher.

## 2012-12-04 ENCOUNTER — Encounter (INDEPENDENT_AMBULATORY_CARE_PROVIDER_SITE_OTHER): Payer: BC Managed Care – PPO | Admitting: Surgery

## 2012-12-05 ENCOUNTER — Encounter (INDEPENDENT_AMBULATORY_CARE_PROVIDER_SITE_OTHER): Payer: Self-pay | Admitting: Surgery

## 2012-12-05 ENCOUNTER — Ambulatory Visit (INDEPENDENT_AMBULATORY_CARE_PROVIDER_SITE_OTHER): Payer: BC Managed Care – PPO | Admitting: Surgery

## 2012-12-05 VITALS — BP 122/68 | HR 64 | Temp 98.5°F | Resp 16 | Ht 65.0 in | Wt 182.8 lb

## 2012-12-05 DIAGNOSIS — Z9884 Bariatric surgery status: Secondary | ICD-10-CM

## 2012-12-05 DIAGNOSIS — Z4651 Encounter for fitting and adjustment of gastric lap band: Secondary | ICD-10-CM

## 2012-12-05 NOTE — Patient Instructions (Addendum)
Thanks for your patience.  If you need further assistance after leaving the office, please call our office and speak with a [facility] nurse.  [phone].  If you want to leave a message for Dr. Aviyah Swetz, please call his office phone at [phone]. 

## 2012-12-05 NOTE — Progress Notes (Signed)
Lapband Fill Encounter Problem List:   Patient Active Problem List   Diagnosis Date Noted  . H/O vitamin D deficiency 08/09/2012  . Unspecified hypothyroidism 08/09/2012  . Menopause 08/09/2012  . Postoperative seroma-in ventral hernia repair site 07/20/2011  . Lapband APS April 2010 07/20/2011  . Cervical dysplasia   . Fibroid   . Hypertension   . Mast cell disorder   . DEGENERATIVE JOINT DISEASE 02/19/2008  . SPONDYLOSIS UNSPEC SITE W/O MENTION MYELOPATHY 02/19/2008  . SINUS TARSI SYNDROME 02/19/2008  . UNEQUAL LEG LENGTH 02/19/2008    [name] Body mass index is 30.42 kg/(m^2). Weight loss since surgery  46.8  Having regurgitation?:  no  Feel that they need a fill?  yes  Nocturnal reflux?  no  Amount of fill  2     Instructions given and weight loss goals discussed.    Had panniculectomy by Ortencia Kick in mid June.  She has done well.  I removed 4 cc then.  Today I put back 2 cc.  Will see again in a few weeks to titrate up.   Matt B. Daphine Deutscher, MD, FACS

## 2012-12-10 ENCOUNTER — Ambulatory Visit (INDEPENDENT_AMBULATORY_CARE_PROVIDER_SITE_OTHER): Payer: BC Managed Care – PPO | Admitting: Psychology

## 2012-12-10 DIAGNOSIS — F4320 Adjustment disorder, unspecified: Secondary | ICD-10-CM

## 2012-12-26 ENCOUNTER — Ambulatory Visit: Payer: BC Managed Care – PPO | Admitting: Gynecology

## 2013-01-14 ENCOUNTER — Encounter (INDEPENDENT_AMBULATORY_CARE_PROVIDER_SITE_OTHER): Payer: Self-pay | Admitting: Surgery

## 2013-01-14 ENCOUNTER — Ambulatory Visit (INDEPENDENT_AMBULATORY_CARE_PROVIDER_SITE_OTHER): Payer: BC Managed Care – PPO | Admitting: Surgery

## 2013-01-14 VITALS — BP 138/90 | HR 76 | Temp 98.9°F | Resp 20 | Ht 65.0 in | Wt 188.0 lb

## 2013-01-14 DIAGNOSIS — Z4651 Encounter for fitting and adjustment of gastric lap band: Secondary | ICD-10-CM | POA: Insufficient documentation

## 2013-01-14 DIAGNOSIS — E669 Obesity, unspecified: Secondary | ICD-10-CM

## 2013-01-14 DIAGNOSIS — Z9884 Bariatric surgery status: Secondary | ICD-10-CM

## 2013-01-14 NOTE — Patient Instructions (Signed)

## 2013-01-14 NOTE — Progress Notes (Signed)
Lapband Fill Encounter Problem List:   Patient Active Problem List   Diagnosis Date Noted  . H/O vitamin D deficiency 08/09/2012  . Unspecified hypothyroidism 08/09/2012  . Menopause 08/09/2012  . Postoperative seroma-in ventral hernia repair site 07/20/2011  . Lapband APS April 2010 07/20/2011  . Cervical dysplasia   . Fibroid   . Hypertension   . Mast cell disorder   . DEGENERATIVE JOINT DISEASE 02/19/2008  . SPONDYLOSIS UNSPEC SITE W/O MENTION MYELOPATHY 02/19/2008  . SINUS TARSI SYNDROME 02/19/2008  . UNEQUAL LEG LENGTH 02/19/2008    [name] Body mass index is 31.28 kg/(m^2). Weight loss since surgery  41  Having regurgitation?:  no  Feel that they need a fill?  yes  Nocturnal reflux?  no  Amount of fill  1     Instructions given and weight loss goals discussed.    We discussed her restriction level and she is not feeling restricted.  She has just returned from 2 weeks in [place] and was able to eat.  Her pre panniculectomy level was 4 cc.  I therefore added 1 cc to her port today. (now at 3).  She was able to drink ok.  I will see Kieanna as needed.   Matt B. Daphine Deutscher, MD, FACS

## 2013-02-06 ENCOUNTER — Encounter: Payer: Self-pay | Admitting: Gynecology

## 2013-02-06 ENCOUNTER — Ambulatory Visit (INDEPENDENT_AMBULATORY_CARE_PROVIDER_SITE_OTHER): Payer: BC Managed Care – PPO | Admitting: Gynecology

## 2013-02-06 VITALS — BP 128/76

## 2013-02-06 DIAGNOSIS — N905 Atrophy of vulva: Secondary | ICD-10-CM

## 2013-02-06 MED ORDER — NONFORMULARY OR COMPOUNDED ITEM: Status: DC

## 2013-02-06 NOTE — Progress Notes (Signed)
The patient presented to the office today stating that she noticed some irritation and itching at times from her external genitalia. And when she wipes he had a little streak of blood noted. Not coming from inside the vagina. The patient does have past history of total abdominal hysterectomy with bilateral salpingo-oophorectomy with concurrent suburethral sling. Patient is on Femring 0.05 vaginal application every 3 months and has done well. On May of 2014 of this year patient had excision of a vaginal cuff granulation tissue and has done well.  Exam: Bartholin's urethra Skene's within normal limits Inferior portion of the labia minora appears to be like a paper cut from dryness and vaginal atrophy. This may have been from the source of patient's complaint. No other lesions were noted externally. Speculum exam demonstrated normal vaginal mucosa scarring was present vaginal cuff intact.  Assessment /plan: Postmenopausal patient with Femring for vasomotor symptoms and doing well. External vulvar atrophy. She will apply estradiol cream every night externally for one week before bedtime and then twice a week for vulvar atrophy and irritation. Patient declined flu vaccine today.

## 2013-02-06 NOTE — Patient Instructions (Signed)
Vaginitis Vaginitis is an inflammation of the vagina. It is most often caused by a change in the normal balance of the bacteria and yeast that live in the vagina. This change in balance causes an overgrowth of certain bacteria or yeast, which causes the inflammation. There are different types of vaginitis, but the most common types are:  Bacterial vaginosis.  Yeast infection (candidiasis).  Trichomoniasis vaginitis. This is a sexually transmitted infection (STI).  Viral vaginitis.  Atropic vaginitis.  Allergic vaginitis. CAUSES  The cause depends on the type of vaginitis. Vaginitis can be caused by:  Bacteria (bacterial vaginosis).  Yeast (yeast infection).  A parasite (trichomoniasis vaginitis)  A virus (viral vaginitis).  Low hormone levels (atrophic vaginitis). Low hormone levels can occur during pregnancy, breastfeeding, or after menopause.  Irritants, such as bubble baths, scented tampons, and feminine sprays (allergic vaginitis). Other factors can change the normal balance of the yeast and bacteria that live in the vagina. These include:  Antibiotic medicines.  Poor hygiene.  Diaphragms, vaginal sponges, spermicides, birth control pills, and intrauterine devices (IUD).  Sexual intercourse.  Infection.  Uncontrolled diabetes.  A weakened immune system. SYMPTOMS  Symptoms can vary depending on the cause of the vaginitis. Common symptoms include:  Abnormal vaginal discharge.  The discharge is white, gray, or yellow with bacterial vaginosis.  The discharge is thick, white, and cheesy with a yeast infection.  The discharge is frothy and yellow or greenish with trichomoniasis.  A bad vaginal odor.  The odor is fishy with bacterial vaginosis.  Vaginal itching, pain, or swelling.  Painful intercourse.  Pain or burning when urinating. Sometimes, there are no symptoms. TREATMENT  Treatment will vary depending on the type of infection.   Bacterial  vaginosis and trichomoniasis are often treated with antibiotic creams or pills.  Yeast infections are often treated with antifungal medicines, such as vaginal creams or suppositories.  Viral vaginitis has no cure, but symptoms can be treated with medicines that relieve discomfort. Your sexual partner should be treated as well.  Atrophic vaginitis may be treated with an estrogen cream, pill, suppository, or vaginal ring. If vaginal dryness occurs, lubricants and moisturizing creams may help. You may be told to avoid scented soaps, sprays, or douches.  Allergic vaginitis treatment involves quitting the use of the product that is causing the problem. Vaginal creams can be used to treat the symptoms. HOME CARE INSTRUCTIONS   Take all medicines as directed by your caregiver.  Keep your genital area clean and dry. Avoid soap and only rinse the area with water.  Avoid douching. It can remove the healthy bacteria in the vagina.  Do not use tampons or have sexual intercourse until your vaginitis has been treated. Use sanitary pads while you have vaginitis.  Wipe from front to back. This avoids the spread of bacteria from the rectum to the vagina.  Let air reach your genital area.  Wear cotton underwear to decrease moisture buildup.  Avoid wearing underwear while you sleep until your vaginitis is gone.  Avoid tight pants and underwear or nylons without a cotton panel.  Take off wet clothing (especially bathing suits) as soon as possible.  Use mild, non-scented products. Avoid using irritants, such as:  Scented feminine sprays.  Fabric softeners.  Scented detergents.  Scented tampons.  Scented soaps or bubble baths.  Practice safe sex and use condoms. Condoms may prevent the spread of trichomoniasis and viral vaginitis. SEEK MEDICAL CARE IF:   You have abdominal pain.  You   have a fever or persistent symptoms for more than 2 3 days.  You have a fever and your symptoms suddenly  get worse. Document Released: 02/12/2007 Document Revised: 01/10/2012 Document Reviewed: 09/28/2011 [employer] Patient Information 2014 [employer].  

## 2013-02-21 ENCOUNTER — Encounter (INDEPENDENT_AMBULATORY_CARE_PROVIDER_SITE_OTHER): Payer: Self-pay | Admitting: Surgery

## 2013-02-21 ENCOUNTER — Ambulatory Visit (INDEPENDENT_AMBULATORY_CARE_PROVIDER_SITE_OTHER): Payer: BC Managed Care – PPO | Admitting: Surgery

## 2013-02-21 VITALS — BP 128/80 | Resp 16 | Ht 65.0 in | Wt 189.0 lb

## 2013-02-21 DIAGNOSIS — Z4651 Encounter for fitting and adjustment of gastric lap band: Secondary | ICD-10-CM

## 2013-02-21 NOTE — Progress Notes (Signed)
Lapband Fill Encounter Problem List:   Patient Active Problem List   Diagnosis Date Noted  . Fitting and adjustment of gastric lap band 01/14/2013  . Obese 01/14/2013  . H/O vitamin D deficiency 08/09/2012  . Unspecified hypothyroidism 08/09/2012  . Menopause 08/09/2012  . Postoperative seroma-in ventral hernia repair site 07/20/2011  . Lapband APS April 2010 07/20/2011  . Cervical dysplasia   . Fibroid   . Hypertension   . Mast cell disorder   . DEGENERATIVE JOINT DISEASE 02/19/2008  . SPONDYLOSIS UNSPEC SITE W/O MENTION MYELOPATHY 02/19/2008  . SINUS TARSI SYNDROME 02/19/2008  . UNEQUAL LEG LENGTH 02/19/2008    [name] Body mass index is 31.45 kg/(m^2). Weight loss since surgery  40  Having regurgitation?:  no  Feel that they need a fill?  yes  Nocturnal reflux?  no  Amount of fill  0.5     Instructions given and weight loss goals discussed.    Has kept her weight off despite going on a cruise.    Matt B. Daphine Deutscher, MD, FACS

## 2013-04-09 ENCOUNTER — Other Ambulatory Visit: Payer: Self-pay

## 2013-04-09 DIAGNOSIS — Z1231 Encounter for screening mammogram for malignant neoplasm of breast: Secondary | ICD-10-CM

## 2013-04-18 ENCOUNTER — Other Ambulatory Visit: Payer: Self-pay | Admitting: Gynecology

## 2013-05-02 HISTORY — PX: BREAST BIOPSY: SHX20

## 2013-05-13 ENCOUNTER — Ambulatory Visit: Payer: BC Managed Care – PPO

## 2013-05-20 ENCOUNTER — Ambulatory Visit
Admission: RE | Admit: 2013-05-20 | Discharge: 2013-05-20 | Disposition: A | Discharge status: Home / Self Care | Payer: BC Managed Care – PPO | Source: Ambulatory Visit

## 2013-05-20 DIAGNOSIS — Z1231 Encounter for screening mammogram for malignant neoplasm of breast: Secondary | ICD-10-CM

## 2013-05-21 ENCOUNTER — Other Ambulatory Visit: Payer: Self-pay | Admitting: Gynecology

## 2013-05-21 DIAGNOSIS — R928 Other abnormal and inconclusive findings on diagnostic imaging of breast: Secondary | ICD-10-CM

## 2013-05-22 ENCOUNTER — Ambulatory Visit
Admission: RE | Admit: 2013-05-22 | Discharge: 2013-05-22 | Disposition: A | Discharge status: Home / Self Care | Payer: BC Managed Care – PPO | Source: Ambulatory Visit | Attending: Gynecology | Admitting: Gynecology

## 2013-05-22 ENCOUNTER — Other Ambulatory Visit: Payer: Self-pay | Admitting: Gynecology

## 2013-05-22 DIAGNOSIS — R928 Other abnormal and inconclusive findings on diagnostic imaging of breast: Secondary | ICD-10-CM

## 2013-05-26 ENCOUNTER — Other Ambulatory Visit (HOSPITAL_COMMUNITY)
Admission: RE | Admit: 2013-05-26 | Discharge: 2013-05-26 | Disposition: A | Discharge status: Home / Self Care | Payer: BC Managed Care – PPO | Source: Ambulatory Visit | Attending: Gynecology | Admitting: Gynecology

## 2013-05-26 ENCOUNTER — Ambulatory Visit (INDEPENDENT_AMBULATORY_CARE_PROVIDER_SITE_OTHER): Payer: BC Managed Care – PPO | Admitting: Gynecology

## 2013-05-26 ENCOUNTER — Encounter: Payer: Self-pay | Admitting: Gynecology

## 2013-05-26 VITALS — BP 132/88

## 2013-05-26 DIAGNOSIS — Z01419 Encounter for gynecological examination (general) (routine) without abnormal findings: Secondary | ICD-10-CM | POA: Insufficient documentation

## 2013-05-26 DIAGNOSIS — Z1272 Encounter for screening for malignant neoplasm of vagina: Secondary | ICD-10-CM

## 2013-05-26 DIAGNOSIS — Z1151 Encounter for screening for human papillomavirus (HPV): Secondary | ICD-10-CM | POA: Insufficient documentation

## 2013-05-26 DIAGNOSIS — N898 Other specified noninflammatory disorders of vagina: Secondary | ICD-10-CM

## 2013-05-26 DIAGNOSIS — Z79890 Hormone replacement therapy (postmenopausal): Secondary | ICD-10-CM

## 2013-05-26 DIAGNOSIS — N950 Postmenopausal bleeding: Secondary | ICD-10-CM

## 2013-05-26 LAB — WET PREP FOR TRICH, YEAST, CLUE
TRICH WET PREP: NONE SEEN
YEAST WET PREP: NONE SEEN

## 2013-05-26 MED ORDER — METRONIDAZOLE 500 MG PO TABS: 500.0000 mg | ORAL_TABLET | Freq: Two times a day (BID) | ORAL | Status: DC

## 2013-05-26 MED ORDER — ESTRADIOL 0.52 MG/0.87 GM (0.06%) TD GEL: 1.0000 "application " | Freq: Every day | TRANSDERMAL | Status: DC

## 2013-05-26 NOTE — Patient Instructions (Signed)
Estradiol topical gel What is this medicine? ESTRADIOL (es tra DYE ole) contains the female hormone estrogen. It is used for symptoms of menopause like hot flashes, night sweats, and mood changes. This medicine may also help relieve other symptoms like vaginal dryness, itching and increased or painful urination. This medicine may be used for other purposes; ask your health care provider or pharmacist if you have questions. COMMON BRAND NAME(S): Divigel, Elestrin, EstroGel What should I tell my health care provider before I take this medicine? They need to know if you have any of these conditions: -abnormal vaginal bleeding -blood vessel disease or blood clots -breast, cervical, endometrial, ovarian, liver, or uterine cancer -dementia -diabetes -gallbladder disease -heart disease or recent heart attack -high blood pressure -high cholesterol -high level of calcium in the blood -hysterectomy -kidney disease -liver disease -migraine headaches -protein C deficiency -protein S deficiency -stroke -systemic lupus erythematosus (SLE) -tobacco smoker -an unusual or allergic reaction to estrogens, other hormones, soy, other medicines, foods, dyes, or preservatives -pregnant or trying to get pregnant -breast-feeding How should I use this medicine? This medicine is for external use only. Follow the directions that come with your prescription. Spread the gel into a thin layer. It is not necessary to rub or massage the gel into the skin. Wash your hands with soap and water after applying the gel. Allow the gel to dry for up to 5 minutes before dressing. Avoid fire, flame or smoking until gel has dried. Do not apply to the breast, face or in or around the vagina. Do not use your medicine more often than directed. A patient package insert for the product will be given with each prescription and refill. Read this sheet carefully each time. The sheet may change frequently. Talk to your pediatrician  regarding the use of this medicine in children. Special care may be needed. Overdosage: If you think you have taken too much of this medicine contact a poison control center or emergency room at once. NOTE: This medicine is only for you. Do not share this medicine with others. What if I miss a dose? If you miss a dose, use it as soon as you can. If it is almost time for your next dose, use only that dose. Do not use double or extra doses. What may interact with this medicine? Do not take this medicine with any of the following medications: -aromatase inhibitors like aminoglutethimide, anastrozole, exemestane, letrozole, testolactone This medicine may also interact with the following medications: -antibiotics used to treat tuberculosis like rifabutin, rifampin and rifapentine -raloxifene or tamoxifen -warfarin This list may not describe all possible interactions. Give your health care provider a list of all the medicines, herbs, non-prescription drugs, or dietary supplements you use. Also tell them if you smoke, drink alcohol, or use illegal drugs. Some items may interact with your medicine. What should I watch for while using this medicine? Visit your health care professional for regular checks on your progress. You will need a regular breast and pelvic exam. You should also discuss the need for regular mammograms with your health care professional, and follow his or her guidelines. This medicine can make your body retain fluid, making your fingers, hands, or ankles swell. Your blood pressure can go up. Contact your doctor or health care professional if you feel you are retaining fluid. If you have any reason to think you are pregnant; stop taking this medicine at once and contact your doctor or health care professional. Tobacco smoking increases the risk  of getting a blood clot or having a stroke, especially if you are more than 62 years old. You are strongly advised not to smoke. If you wear  contact lenses and notice visual changes, or if the lenses begin to feel uncomfortable, consult your eye care specialist. If you are going to have elective surgery, you may need to stop taking this medicine beforehand. Consult your health care professional for advice prior to scheduling the surgery. What side effects may I notice from receiving this medicine? Side effects that you should report to your doctor or health care professional as soon as possible: -breakthrough bleeding and spotting -breast tissue changes or discharge -chest pain -confusion, forgetfulness -leg, arm or groin pain -nausea, vomiting -severe headaches -speech problems -stomach pain (severe) -sudden shortness of breath -yellowing of the eyes or skin Side effects that usually do not require medical attention (report to your doctor or health care professional if they continue or are bothersome): -changes in emotions or mood -changes in sex drive or performance -increased or decreased appetite -skin rash, acne, or brown spots on the skin -symptoms of vaginal infection like itching, irritation or unusual discharge -weight gain This list may not describe all possible side effects. Call your doctor for medical advice about side effects. You may report side effects to FDA at 1-800-FDA-1088. Where should I keep my medicine? Keep out of the reach of children. Store at room temperature between 15 and 30 degrees C (59 and 86 degrees F). Throw away any unused medicine after the expiration date. NOTE: This sheet is a summary. It may not cover all possible information. If you have questions about this medicine, talk to your doctor, pharmacist, or health care provider.  2014, Elsevier/Gold Standard. (2010-07-20 09:18:48) Bacterial Vaginosis Bacterial vaginosis is a vaginal infection that occurs when the normal balance of bacteria in the vagina is disrupted. It results from an overgrowth of certain bacteria. This is the most  common vaginal infection in women of childbearing age. Treatment is important to prevent complications, especially in pregnant women, as it can cause a premature delivery. CAUSES  Bacterial vaginosis is caused by an increase in harmful bacteria that are normally present in smaller amounts in the vagina. Several different kinds of bacteria can cause bacterial vaginosis. However, the reason that the condition develops is not fully understood. RISK FACTORS Certain activities or behaviors can put you at an increased risk of developing bacterial vaginosis, including:  Having a new sex partner or multiple sex partners.  Douching.  Using an intrauterine device (IUD) for contraception. Women do not get bacterial vaginosis from toilet seats, bedding, swimming pools, or contact with objects around them. SIGNS AND SYMPTOMS  Some women with bacterial vaginosis have no signs or symptoms. Common symptoms include:  Grey vaginal discharge.  A fishlike odor with discharge, especially after sexual intercourse.  Itching or burning of the vagina and vulva.  Burning or pain with urination. DIAGNOSIS  Your health care provider will take a medical history and examine the vagina for signs of bacterial vaginosis. A sample of vaginal fluid may be taken. Your health care provider will look at this sample under a microscope to check for bacteria and abnormal cells. A vaginal pH test may also be done.  TREATMENT  Bacterial vaginosis may be treated with antibiotic medicines. These may be given in the form of a pill or a vaginal cream. A second round of antibiotics may be prescribed if the condition comes back after treatment.  HOME CARE  INSTRUCTIONS   Only take over-the-counter or prescription medicines as directed by your health care provider.  If antibiotic medicine was prescribed, take it as directed. Make sure you finish it even if you start to feel better.  Do not have sex until treatment is  completed.  Tell all sexual partners that you have a vaginal infection. They should see their health care provider and be treated if they have problems, such as a mild rash or itching.  Practice safe sex by using condoms and only having one sex partner. SEEK MEDICAL CARE IF:   Your symptoms are not improving after 3 days of treatment.  You have increased discharge or pain.  You have a fever. MAKE SURE YOU:   Understand these instructions.  Will watch your condition.  Will get help right away if you are not doing well or get worse. FOR MORE INFORMATION  Centers for Disease Control and Prevention, Division of STD Prevention: [employer]raud[employer] American Sexual Health Association (ASHA): www.ashastd.org  Document Released: 04/17/2005 Document Revised: 02/05/2013 Document Reviewed: 11/27/2012 [facility] Patient Information 2014 [place].

## 2013-05-26 NOTE — Progress Notes (Signed)
   Patient is 63 year old that presented to the office today complaining one month of vaginal spotting. Review of patient's records indicated that she has a past history of total abdominal hysterectomy with bilateral salpingo-oophorectomy with concurrent suburethral sling in 2012. Patient stated that prior to her surgery several times in the past that she had had cryotherapy of her cervix for dysplasia. Her pathology report from her hysterectomy menstruated no abnormal pathology on her cervix. Patient has been on Femring 0.05 mg fascia plus intravaginally and changes it every 3 months. For external vulvar atrophy dryness and irritation she has been applying topical estrogen twice a week. On May 2014 she had excision of vaginal cuff granulation tissue and had done well.  Exam: Bartholin's urethra Skene's glands within normal limits some mild atrophic changes Vagina: The Femring was removed the vaginal cuff the mucosa was friable contact requiring silver nitrate to contain some of the oozing. Prior to this a Pap smear was done as well as a wet prep.  A wet prep demonstrated: Pos Amine, moderate clue cells, moderate white blood cells and too numerous to count bacteria  Patient denied any GU or GI complaints.  Assessment/plan: Patient with bacterial vaginosis as well as irritation from the [place]. We're going to change her estrogen routes of administration and eliminate the vaginal ring. She will be prescribed Elestrin 0.06% gel to apply to one arm only before bedtime. She will be prescribed Flagyl 500 mg one by mouth twice a day for 7 days for bacterial vaginosis. She will use the vaginal estrogen cream every other day for this week only and may apply a small film external genitalia twice a week thereafter. She will return back to the office in one month for followup. She will refrain from intercourse for 2 weeks. She is in the process of undergoing a right breast biopsy in the next few days. We'll await  the results. If it is malignant we will need to stop all her estrogen.

## 2013-05-28 ENCOUNTER — Ambulatory Visit
Admission: RE | Admit: 2013-05-28 | Discharge: 2013-05-28 | Disposition: A | Discharge status: Home / Self Care | Payer: BC Managed Care – PPO | Source: Ambulatory Visit | Attending: Gynecology | Admitting: Gynecology

## 2013-05-28 DIAGNOSIS — R928 Other abnormal and inconclusive findings on diagnostic imaging of breast: Secondary | ICD-10-CM

## 2013-06-24 ENCOUNTER — Ambulatory Visit: Payer: BC Managed Care – PPO | Admitting: Gynecology

## 2013-07-04 ENCOUNTER — Encounter: Payer: Self-pay | Admitting: Women's Health

## 2013-07-04 ENCOUNTER — Ambulatory Visit (INDEPENDENT_AMBULATORY_CARE_PROVIDER_SITE_OTHER): Payer: BC Managed Care – PPO | Admitting: Women's Health

## 2013-07-04 DIAGNOSIS — A499 Bacterial infection, unspecified: Secondary | ICD-10-CM

## 2013-07-04 DIAGNOSIS — B9689 Other specified bacterial agents as the cause of diseases classified elsewhere: Secondary | ICD-10-CM

## 2013-07-04 DIAGNOSIS — N898 Other specified noninflammatory disorders of vagina: Secondary | ICD-10-CM

## 2013-07-04 DIAGNOSIS — N760 Acute vaginitis: Secondary | ICD-10-CM

## 2013-07-04 DIAGNOSIS — L293 Anogenital pruritus, unspecified: Secondary | ICD-10-CM

## 2013-07-04 DIAGNOSIS — Z79890 Hormone replacement therapy (postmenopausal): Secondary | ICD-10-CM

## 2013-07-04 DIAGNOSIS — R300 Dysuria: Secondary | ICD-10-CM

## 2013-07-04 LAB — URINALYSIS W MICROSCOPIC + REFLEX CULTURE
Bilirubin Urine: NEGATIVE
HGB URINE DIPSTICK: NEGATIVE
KETONES UR: NEGATIVE mg/dL
LEUKOCYTES UA: NEGATIVE
Nitrite: NEGATIVE
Protein, ur: NEGATIVE mg/dL
Specific Gravity, Urine: 1.015 (ref 1.005–1.030)
Urobilinogen, UA: 0.2 mg/dL (ref 0.0–1.0)
pH: 6.5 (ref 5.0–8.0)

## 2013-07-04 LAB — WET PREP FOR TRICH, YEAST, CLUE
Trich, Wet Prep: NONE SEEN
WBC, Wet Prep HPF POC: NONE SEEN
YEAST WET PREP: NONE SEEN

## 2013-07-04 MED ORDER — FLUCONAZOLE 150 MG PO TABS: 150.0000 mg | ORAL_TABLET | Freq: Once | ORAL | Status: DC

## 2013-07-04 MED ORDER — METRONIDAZOLE 0.75 % VA GEL: VAGINAL | Status: DC

## 2013-07-04 MED ORDER — ESTRADIOL 0.0375 MG/24HR TD PTTW: 1.0000 | MEDICATED_PATCH | TRANSDERMAL | Status: DC

## 2013-07-04 NOTE — Progress Notes (Signed)
Patient ID: Gabrielle Lynch, female   DOB: [birth year], 63 y.o.   MRN: [id number] Presents with complaint of vaginal irritation with itching. States noted scant blood on toilet tissue. 2012 TAH. Elestrin gel but would like to change. Mild burning with urination. Denies abdominal pain or fever.  Exam: Appears well. External genitalia extremely erythematous, fissure on left labia, wet prep done with a Q-tip . Positive for clues, many bacteria. UA: Negative, positive for greater than 1000 mg glucose  Bacteria vaginosis Symptomatic yeast HRT management  Plan: MetroGel vaginal cream 1 applicator at bedtime x5, prescription, proper use, alcohol precautions reviewed. Followup with primary care for positive glucose in urine, states it as a side effect from medication, normal hemoglobin A1c. HRT options reviewed will try minivelle 0.0375 patch twice weekly, prescription, proper use, risk for blood clots and strokes reviewed. States has numerous hot flushes off, would like to continue. Keep scheduled annual with Dr. Toney Rakes. Diflucan 150 times one dose. Call if no relief. A&D ointment to perineum.

## 2013-07-11 ENCOUNTER — Ambulatory Visit: Payer: BC Managed Care – PPO | Admitting: Women's Health

## 2013-08-07 ENCOUNTER — Encounter: Payer: Self-pay | Admitting: Gynecology

## 2013-08-07 ENCOUNTER — Ambulatory Visit (INDEPENDENT_AMBULATORY_CARE_PROVIDER_SITE_OTHER): Payer: BC Managed Care – PPO | Admitting: Gynecology

## 2013-08-07 ENCOUNTER — Ambulatory Visit: Payer: BC Managed Care – PPO | Admitting: Gynecology

## 2013-08-07 VITALS — BP 128/88

## 2013-08-07 DIAGNOSIS — IMO0002 Reserved for concepts with insufficient information to code with codable children: Secondary | ICD-10-CM

## 2013-08-07 DIAGNOSIS — L918 Other hypertrophic disorders of the skin: Secondary | ICD-10-CM

## 2013-08-07 DIAGNOSIS — N898 Other specified noninflammatory disorders of vagina: Secondary | ICD-10-CM

## 2013-08-07 DIAGNOSIS — R6889 Other general symptoms and signs: Secondary | ICD-10-CM

## 2013-08-07 DIAGNOSIS — Z79890 Hormone replacement therapy (postmenopausal): Secondary | ICD-10-CM

## 2013-08-07 MED ORDER — ESTROGENS, CONJUGATED 0.625 MG/GM VA CREA: 1.0000 | TOPICAL_CREAM | Freq: Every day | VAGINAL | Status: DC

## 2013-08-07 MED ORDER — ESTRADIOL 0.075 MG/24HR TD PTTW: 1.0000 | MEDICATED_PATCH | TRANSDERMAL | Status: DC

## 2013-08-07 NOTE — Progress Notes (Signed)
   The patient presented to the office today for colposcopic evaluation. She was seen in the office in January this year for her annual exam.Review of patient's records indicated that she has a past history of total abdominal hysterectomy with bilateral salpingo-oophorectomy with concurrent suburethral sling in 2012. Patient stated that prior to her surgery several times in the past that she had had cryotherapy of her cervix for dysplasia. Her pathology report from her hysterectomy demonstrated no abnormal pathology on her cervix. On May 2014 she had excision of vaginal cuff granulation tissue and had done well. She did experience a slight erosion from the vaginal estrogen ring and was switched over to Elestri 0.06% which she applied transdermally to one arm each bedtime. She came by and was seen by the nurse practitioner and put her on Minivelle 0.0375 and she continues to have vasomotor symptoms.  Exam: Patient underwent a detailed colposcopic evaluation of the external genitalia vagina and vaginal cuff. Patient with paper like cuts between the labia minora and majora from dryness and hypoestrogenism. The perineum and perirectal region was otherwise unremarkable. Detailed examination of the vaginal cuff was intact with no granulation tissue no lesions seen.  Assessment/plan: #1 atypical squamous cells of undetermined significance favor a benign #2 past history of granulation tissue removed and the vaginal cuff none present today completely healed. #3 will increase patient's Minivelle transdermal patch dose to 0.075 mg twice a week. #4 for her external genital vaginal atrophy she can apply a little bit of Premarin cream externally twice a week.

## 2013-10-29 ENCOUNTER — Other Ambulatory Visit: Payer: Self-pay | Admitting: Gynecology

## 2013-10-29 DIAGNOSIS — N6310 Unspecified lump in the right breast, unspecified quadrant: Secondary | ICD-10-CM

## 2013-11-19 ENCOUNTER — Encounter: Payer: BC Managed Care – PPO | Admitting: Women's Health

## 2013-12-04 ENCOUNTER — Ambulatory Visit
Admission: RE | Admit: 2013-12-04 | Discharge: 2013-12-04 | Disposition: A | Discharge status: Home / Self Care | Payer: BC Managed Care – PPO | Source: Ambulatory Visit | Attending: Gynecology | Admitting: Gynecology

## 2013-12-04 ENCOUNTER — Encounter (INDEPENDENT_AMBULATORY_CARE_PROVIDER_SITE_OTHER): Payer: Self-pay

## 2013-12-04 DIAGNOSIS — N6310 Unspecified lump in the right breast, unspecified quadrant: Secondary | ICD-10-CM

## 2014-03-02 ENCOUNTER — Encounter: Payer: Self-pay | Admitting: Gynecology

## 2014-05-15 ENCOUNTER — Other Ambulatory Visit: Payer: Self-pay

## 2014-05-15 DIAGNOSIS — Z1231 Encounter for screening mammogram for malignant neoplasm of breast: Secondary | ICD-10-CM

## 2014-05-21 ENCOUNTER — Ambulatory Visit: Payer: Self-pay

## 2014-05-25 ENCOUNTER — Telehealth: Payer: Self-pay | Admitting: *Deleted

## 2014-05-25 NOTE — Telephone Encounter (Signed)
PA form filled out and faxed to [facility] for [employer] patch 0.075 mg patch. Will wait for response.

## 2014-05-29 NOTE — Telephone Encounter (Signed)
Rx was denied pt will need to try/fail  at least 2 formulary medications such as climara patch and vivelle-dot patch.

## 2014-06-01 ENCOUNTER — Telehealth: Payer: Self-pay | Admitting: *Deleted

## 2014-06-01 MED ORDER — ESTRADIOL 0.075 MG/24HR TD PTWK: 0.0750 mg | MEDICATED_PATCH | TRANSDERMAL | Status: DC

## 2014-06-01 NOTE — Telephone Encounter (Signed)
Pt medication for Rx was denied pt will need to try/fail at least 2 formulary medications such as climara patch and vivelle-dot patch. Left message for pt to call regarding this.

## 2014-06-01 NOTE — Telephone Encounter (Signed)
We will switch her to the weekly Climara transdermal patch 0.075 mg every weekly. Call in for patches 11 refills

## 2014-06-01 NOTE — Telephone Encounter (Signed)
Rx sent, pt aware 

## 2014-06-01 NOTE — Telephone Encounter (Signed)
Dr.Fernandez minivelle 0.075 mg patch was denied must try one of the alternate medication such as climara patch or vivelle dot patch. Please advise

## 2014-06-09 ENCOUNTER — Ambulatory Visit: Payer: Self-pay

## 2014-06-10 ENCOUNTER — Ambulatory Visit
Admission: RE | Admit: 2014-06-10 | Discharge: 2014-06-10 | Disposition: A | Discharge status: Home / Self Care | Payer: BLUE CROSS/BLUE SHIELD | Source: Ambulatory Visit

## 2014-06-10 DIAGNOSIS — Z1231 Encounter for screening mammogram for malignant neoplasm of breast: Secondary | ICD-10-CM

## 2014-06-23 ENCOUNTER — Ambulatory Visit (INDEPENDENT_AMBULATORY_CARE_PROVIDER_SITE_OTHER): Payer: BLUE CROSS/BLUE SHIELD | Admitting: Gynecology

## 2014-06-23 ENCOUNTER — Encounter: Payer: Self-pay | Admitting: Gynecology

## 2014-06-23 ENCOUNTER — Other Ambulatory Visit (HOSPITAL_COMMUNITY)
Admission: RE | Admit: 2014-06-23 | Discharge: 2014-06-23 | Disposition: A | Discharge status: Home / Self Care | Payer: BLUE CROSS/BLUE SHIELD | Source: Ambulatory Visit | Attending: Gynecology | Admitting: Gynecology

## 2014-06-23 VITALS — BP 120/80 | Ht 64.0 in | Wt 181.0 lb

## 2014-06-23 DIAGNOSIS — Z1151 Encounter for screening for human papillomavirus (HPV): Secondary | ICD-10-CM | POA: Diagnosis present

## 2014-06-23 DIAGNOSIS — Z780 Asymptomatic menopausal state: Secondary | ICD-10-CM

## 2014-06-23 DIAGNOSIS — Z01419 Encounter for gynecological examination (general) (routine) without abnormal findings: Secondary | ICD-10-CM

## 2014-06-23 DIAGNOSIS — Z79890 Hormone replacement therapy (postmenopausal): Secondary | ICD-10-CM

## 2014-06-23 DIAGNOSIS — R896 Abnormal cytological findings in specimens from other organs, systems and tissues: Secondary | ICD-10-CM

## 2014-06-23 DIAGNOSIS — IMO0002 Reserved for concepts with insufficient information to code with codable children: Secondary | ICD-10-CM

## 2014-06-23 NOTE — Progress Notes (Signed)
Gabrielle Lynch Feb 13, 1951 [id number]   History:    64 y.o.  for annual gyn exam who last year's Pap smear had demonstrated atypical squamous cells of undetermined significance without HPV. Patient also has been on estrogen replacement therapy for over 10 years. She is currently on Climara transdermal patch 0.075 mg weekly.Review of her record indicated that in 2012 she had a total abdominal hysterectomy with bilateral salpingo-oophorectomy with concurrent suburethral sling Duane Lope). The sling procedure was done by Dr. Gaynelle Arabian urologist.  In May 2014 she had a LEEP excision of a vaginal cuff lesion in which the pathology report had demonstrated the following:  Diagnosis Vagina, cuff lesion - GRANULATION TISSUE. - NO EVIDENCE OF SQUAMOUS EPITHELIUM. - NO ATYPIA OR MALIGNANCY.  She is no longer having any vaginal bleeding. She had a normal bone density study in 2014. She had a normal three-dimensional mammogram this year. And her colonoscopies are up-to-date. Her primary care physician is going to be now Dr. Inda Merlin who she seemed this week and we'll be doing her blood work with them. Patient has had past history vitamin D deficiency currently not taking any calcium or vitamin D.  Past medical history,surgical history, family history and social history were all reviewed and documented in the EPIC chart.  Gynecologic History No LMP recorded. Patient has had a hysterectomy. Contraception: status post hysterectomy Last Pap: 2015. Results were: ASCUS without HPV Last mammogram: 2016. Results were: normal  Obstetric History OB History  Gravida Para Term Preterm AB SAB TAB Ectopic Multiple Living  2 2        2     # Outcome Date GA Lbr Len/2nd Weight Sex Delivery Anes PTL Lv  2 Para           1 Para                ROS: A ROS was performed and pertinent positives and negatives are included in the history.  GENERAL: No fevers or chills. HEENT: No change in vision, no earache, sore throat or  sinus congestion. NECK: No pain or stiffness. CARDIOVASCULAR: No chest pain or pressure. No palpitations. PULMONARY: No shortness of breath, cough or wheeze. GASTROINTESTINAL: No abdominal pain, nausea, vomiting or diarrhea, melena or bright red blood per rectum. GENITOURINARY: No urinary frequency, urgency, hesitancy or dysuria. MUSCULOSKELETAL: No joint or muscle pain, no back pain, no recent trauma. DERMATOLOGIC: No rash, no itching, no lesions. ENDOCRINE: No polyuria, polydipsia, no heat or cold intolerance. No recent change in weight. HEMATOLOGICAL: No anemia or easy bruising or bleeding. NEUROLOGIC: No headache, seizures, numbness, tingling or weakness. PSYCHIATRIC: No depression, no loss of interest in normal activity or change in sleep pattern.     Exam: chaperone present  BP 120/80 mmHg  Ht 5\' 4"  (1.626 m)  Wt 181 lb (82.101 kg)  BMI 31.05 kg/m2  Body mass index is 31.05 kg/(m^2).  General appearance : Well developed well nourished female. No acute distress HEENT: Eyes: no retinal hemorrhage or exudates,  Neck supple, trachea midline, no carotid bruits, no thyroidmegaly Lungs: Clear to auscultation, no rhonchi or wheezes, or rib retractions  Heart: Regular rate and rhythm, no murmurs or gallops Breast:Examined in sitting and supine position were symmetrical in appearance, no palpable masses or tenderness,  no skin retraction, no nipple inversion, no nipple discharge, no skin discoloration, no axillary or supraclavicular lymphadenopathy Abdomen: no palpable masses or tenderness, no rebound or guarding Extremities: no edema or skin discoloration or tenderness  Pelvic:  Bartholin, Urethra, Skene Glands: Within normal limits             Vagina: No gross lesions or discharge  Cervix: Absent  Uterus  absent  Adnexa  Without masses or tenderness  Anus and perineum  normal   Rectovaginal  normal sphincter tone without palpated masses or tenderness             Hemoccult PCP will provide       Assessment/Plan:  64 y.o. female for annual exam postmenopausal has been on estrogen replacement therapy over 10 years since her hysterectomy. We discussed the concerns as well as the women's health initiative study and potential risk of breast cancer. She is going to begin tapering off of her patch. She will try peppermint oil to apply at the base of her neck daily to offset some of the vasomotor symptoms she may experience. She is due for bone density study in May of this year. We discussed importance of monthly breast exam. With her primary does her blood work she would be checking her vitamin D since she has had history vitamin D deficiency in the past. I've encouraged her to begin taking vitamin D3 2000 units daily. Pap smear with HPV screening was done today. She was reminded to check with her gastroenterologist when her next colonoscopy is due.   Terrance Mass MD, 5:01 PM 06/23/2014

## 2014-06-23 NOTE — Patient Instructions (Signed)

## 2014-06-26 LAB — CYTOLOGY - PAP

## 2014-08-07 ENCOUNTER — Telehealth: Payer: Self-pay | Admitting: Internal Medicine

## 2014-08-07 NOTE — Telephone Encounter (Signed)
CALLED PATIENT TO GIVE NP APPT FOR 05/16 PATIENT DELCINE DATE TO FAR OUT FORMED PATIENT OF DR. Marin Olp OFFICE PER PATIENT WILL CONTACT ME IF SHE WANTED RECORDS TO BE TRANSFERED

## 2014-08-18 ENCOUNTER — Ambulatory Visit
Admission: RE | Admit: 2014-08-18 | Discharge: 2014-08-18 | Disposition: A | Discharge status: Home / Self Care | Payer: BLUE CROSS/BLUE SHIELD | Source: Ambulatory Visit | Attending: Internal Medicine | Admitting: Internal Medicine

## 2014-08-18 ENCOUNTER — Other Ambulatory Visit: Payer: Self-pay | Admitting: Internal Medicine

## 2014-08-18 DIAGNOSIS — R05 Cough: Secondary | ICD-10-CM

## 2014-08-18 DIAGNOSIS — R059 Cough, unspecified: Secondary | ICD-10-CM

## 2014-09-29 ENCOUNTER — Telehealth: Payer: Self-pay | Admitting: *Deleted

## 2014-09-29 MED ORDER — ESTRADIOL 2 MG VA RING: 2.0000 mg | VAGINAL_RING | VAGINAL | Status: DC

## 2014-09-29 NOTE — Telephone Encounter (Signed)
Pt currently taking climara patch 0.075 mg states skin has broke out due to this and would like to switch to other method. She has tried estradiol gel and didn't like it. Please advise

## 2014-09-29 NOTE — Telephone Encounter (Signed)
Pt informed with the below note. 

## 2014-09-29 NOTE — Telephone Encounter (Signed)
Vaginal Estring 2 MG applied and changed every three months would be an idea way to go. If interested call in # 1 Ring with 3 refills this would last her for a year

## 2014-09-30 ENCOUNTER — Telehealth: Payer: Self-pay | Admitting: Hematology and Oncology

## 2014-09-30 ENCOUNTER — Telehealth: Payer: Self-pay | Admitting: *Deleted

## 2014-09-30 NOTE — Telephone Encounter (Signed)
patient would like to wait on this referral until her next labwork comes back.

## 2014-09-30 NOTE — Telephone Encounter (Signed)
Prior Authorization done online for estring 2 mg vaginal ring, with [employer] will wait for response.

## 2014-10-06 NOTE — Telephone Encounter (Signed)
[employer] denied Rx for estring, I spoke with Angie at [facility] and informed her premarin vaginal cream and estradiol was tried and failed. Angie said medication was approved since both had been tried and failed. Pt can check with pharmacy in afternoon to pick up. Pt aware.

## 2014-10-29 ENCOUNTER — Ambulatory Visit (INDEPENDENT_AMBULATORY_CARE_PROVIDER_SITE_OTHER): Payer: BLUE CROSS/BLUE SHIELD | Admitting: Gynecology

## 2014-10-29 ENCOUNTER — Encounter: Payer: Self-pay | Admitting: Gynecology

## 2014-10-29 ENCOUNTER — Telehealth: Payer: Self-pay | Admitting: *Deleted

## 2014-10-29 VITALS — BP 128/80

## 2014-10-29 DIAGNOSIS — Z79890 Hormone replacement therapy (postmenopausal): Secondary | ICD-10-CM

## 2014-10-29 DIAGNOSIS — N951 Menopausal and female climacteric states: Secondary | ICD-10-CM

## 2014-10-29 MED ORDER — ESTRADIOL 0.075 MG/24HR TD PTTW: 1.0000 | MEDICATED_PATCH | TRANSDERMAL | Status: DC

## 2014-10-29 NOTE — Progress Notes (Signed)
   Patient is a 64 year old was seen in February this year for her annual gynecological examination. She had been on Climara transdermal patch 0.075 mg weekly.Review of her record indicated that in 2012 she had a total abdominal hysterectomy with bilateral salpingo-oophorectomy with concurrent suburethral sling Duane Lope). Patient had been on Estraderm replacement therapy for 10 years since her hysterectomy.We discussed the concerns as well as the women's health initiative study and potential risk of breast cancer. She is going to begin tapering off of her patch. She will try peppermint oil to apply at the base of her neck daily to offset some of the vasomotor symptoms she states that this is not help with her vasomotor symptoms. She tried the transdermal patch as well as transdermal gel that cause her to have a rash and for this reason we put her on the Estring 2 mg every 3 months and she is here because she is not sure she's putting it in correctly her fist following out. She is also been on Effexor 75 mg daily to help with her vasomotor symptoms for several years as well.  Exam: Bartholin urethra Skene was within normal limits Vagina: Vaginal cuff intact Estring in the proper place. Bimanual exam unremarkable  Assessment/plan: Postmenopausal patient that we've been trying to wean off Estraderm replacement therapy. She was started on Estring 2 mg every 3 months. I am also wanted to recommend that she use Relizen a natural nonhormonal supplement twice a day to help with her vasomotor symptoms and tried to wean her off the Estring and the Effexor. Patient to schedule her bone density study.

## 2014-10-29 NOTE — Telephone Encounter (Signed)
Pt having trouble placing Estring has tried every position to place it. I advised pt make OV to be placed. Transferred to front desk

## 2014-10-29 NOTE — Addendum Note (Signed)
Addended by: Terrance Mass on: 10/29/2014 03:52 PM   Modules accepted: Orders, Medications

## 2014-10-29 NOTE — Patient Instructions (Signed)
estradiol vaginal ring (Estring) What is this medicine? ESTRADIOL (es tra DYE ole) vaginal ring is an insert that contains a female hormone. This medicine helps relieve symptoms of vaginal irritation and dryness that occurs in some women during menopause. This medicine may be used for other purposes; ask your health care provider or pharmacist if you have questions. COMMON BRAND NAME(S): Estring What should I tell my health care provider before I take this medicine? They need to know if you have any of these conditions: -abnormal vaginal bleeding -blood vessel disease or blood clots -breast, cervical, endometrial, ovarian, liver, or uterine cancer -dementia -diabetes -gallbladder disease -heart disease or recent heart attack -high blood pressure -high cholesterol -high level of calcium in the blood -hysterectomy -kidney disease -liver disease -migraine headaches -protein C deficiency -protein S deficiency -stroke -systemic lupus erythematosus (SLE) -tobacco smoker -an unusual or allergic reaction to estrogens, other hormones, medicines, foods, dyes, or preservatives -pregnant or trying to get pregnant -breast-feeding How should I use this medicine? This medicine may be inserted by you or your physician. Follow the directions that are included with your prescription. If you are unsure how to insert the ring, contact your doctor or health care professional. The vaginal ring should remain in place for 90 days. After 90 days you should replace your old ring and insert a new one. Do not stop using except on the advice of your doctor or health care professional. Contact your pediatrician regarding the use of this medicine in children. Special care may be needed. A patient package insert for the product will be given with each prescription and refill. Read this sheet carefully each time. The sheet may change frequently. Overdosage: If you think you have taken too much of this medicine  contact a poison control center or emergency room at once. NOTE: This medicine is only for you. Do not share this medicine with others. What if I miss a dose? If you miss a dose, use it as soon as you can. If it is almost time for your next dose, use only that dose. Do not use double or extra doses. What may interact with this medicine? Do not take this medicine with any of the following medications: -aromatase inhibitors like aminoglutethimide, anastrozole, exemestane, letrozole, testolactone, vorozole This medicine may also interact with the following medications: -carbamazepine -certain antibiotics used to treat infections -certain barbiturates used for inducing sleep or treating seizures -grapefruit juice -medicines for fungus infections like itraconazole and ketoconazole -raloxifene or tamoxifen -rifabutin, rifampin, or rifapentine -ritonavir -St. John's Wort This list may not describe all possible interactions. Give your health care provider a list of all the medicines, herbs, non-prescription drugs, or dietary supplements you use. Also tell them if you smoke, drink alcohol, or use illegal drugs. Some items may interact with your medicine. What should I watch for while using this medicine? Visit your doctor or health care professional for regular checks on your progress. You will need a regular breast and pelvic exam and Pap smear while on this medicine. You should also discuss the need for regular mammograms with your health care professional, and follow his or her guidelines for these tests. This medicine can make your body retain fluid, making your fingers, hands, or ankles swell. Your blood pressure can go up. Contact your doctor or health care professional if you feel you are retaining fluid. If you have any reason to think you are pregnant, stop taking this medicine right away and contact your doctor or health  care professional. Smoking increases the risk of getting a blood clot or  having a stroke while you are taking this medicine, especially if you are more than 63 years old. You are strongly advised not to smoke. If you wear contact lenses and notice visual changes, or if the lenses begin to feel uncomfortable, consult your eye doctor or health care professional. This medicine can increase the risk of developing a condition (endometrial hyperplasia) that may lead to cancer of the lining of the uterus. Taking progestins, another hormone drug, with this medicine lowers the risk of developing this condition. Therefore, if your uterus has not been removed (by a hysterectomy), your doctor may prescribe a progestin for you to take together with your estrogen. You should know, however, that taking estrogens with progestins may have additional health risks. You should discuss the use of estrogens and progestins with your health care professional to determine the benefits and risks for you. If you are going to have surgery, you may need to stop taking this medicine. Consult your health care professional for advice before you schedule the surgery. You may bathe or participate in other activities while using this medicine. You do not need to remove the vaginal ring during sexual or other activities unless you are more comfortable doing so. Within the 90-day dosage period, you may remove the vaginal ring, rinse it with clean lukewarm (not hot or boiling) water, and re-insert the ring as needed. What side effects may I notice from receiving this medicine? Side effects that you should report to your doctor or health care professional as soon as possible: -allergic reactions like skin rash, itching or hives, swelling of the face, lips, or tongue -breast tissue changes or discharge -changes in vision -chest pain -confusion, trouble speaking or understanding -dark urine -general ill feeling or flu-like symptoms -light-colored stools -nausea, vomiting -pain, swelling, warmth in the  leg -right upper belly pain -severe headaches -shortness of breath -sudden numbness or weakness of the face, arm or leg -trouble walking, dizziness, loss of balance or coordination -unusual vaginal bleeding -yellowing of the eyes or skin Side effects that usually do not require medical attention (report to your doctor or health care professional if they continue or are bothersome): -hair loss -increased hunger or thirst -increased urination -symptoms of vaginal infection like itching, irritation or unusual discharge -unusually weak or tired This list may not describe all possible side effects. Call your doctor for medical advice about side effects. You may report side effects to FDA at 1-800-FDA-1088. Where should I keep my medicine? Keep out of the reach of children. Store at room temperature between 15 and 30 degrees C (59 and 86 degrees F). Throw away any unused medicine after the expiration date. NOTE: This sheet is a summary. It may not cover all possible information. If you have questions about this medicine, talk to your doctor, pharmacist, or health care provider.  2015, [employer] Standard. (2010-07-20 09:17:50)

## 2015-05-28 ENCOUNTER — Ambulatory Visit: Payer: Self-pay | Admitting: Surgery

## 2015-06-07 ENCOUNTER — Other Ambulatory Visit: Payer: Self-pay | Admitting: Internal Medicine

## 2015-06-07 DIAGNOSIS — D170 Benign lipomatous neoplasm of skin and subcutaneous tissue of head, face and neck: Secondary | ICD-10-CM

## 2015-06-11 ENCOUNTER — Ambulatory Visit
Admission: RE | Admit: 2015-06-11 | Discharge: 2015-06-11 | Disposition: A | Discharge status: Home / Self Care | Payer: BLUE CROSS/BLUE SHIELD | Source: Ambulatory Visit | Attending: Internal Medicine | Admitting: Internal Medicine

## 2015-06-11 DIAGNOSIS — D170 Benign lipomatous neoplasm of skin and subcutaneous tissue of head, face and neck: Secondary | ICD-10-CM

## 2015-06-23 ENCOUNTER — Ambulatory Visit (HOSPITAL_BASED_OUTPATIENT_CLINIC_OR_DEPARTMENT_OTHER): Admission: RE | Admit: 2015-06-23 | Payer: BLUE CROSS/BLUE SHIELD | Source: Ambulatory Visit | Admitting: Surgery

## 2015-06-23 ENCOUNTER — Encounter (HOSPITAL_BASED_OUTPATIENT_CLINIC_OR_DEPARTMENT_OTHER): Admission: RE | Payer: Self-pay | Source: Ambulatory Visit

## 2015-06-23 SURGERY — EXCISION MASS
Anesthesia: General | Site: Ear | Laterality: Left

## 2015-07-05 ENCOUNTER — Other Ambulatory Visit: Payer: Self-pay | Admitting: Rheumatology

## 2015-07-05 ENCOUNTER — Other Ambulatory Visit: Payer: Self-pay | Admitting: Gynecology

## 2015-07-05 DIAGNOSIS — E2839 Other primary ovarian failure: Secondary | ICD-10-CM

## 2015-07-05 DIAGNOSIS — Z7952 Long term (current) use of systemic steroids: Secondary | ICD-10-CM

## 2015-08-17 ENCOUNTER — Encounter: Payer: Self-pay | Admitting: Gynecology

## 2015-08-17 ENCOUNTER — Ambulatory Visit (INDEPENDENT_AMBULATORY_CARE_PROVIDER_SITE_OTHER): Payer: BLUE CROSS/BLUE SHIELD

## 2015-08-17 ENCOUNTER — Other Ambulatory Visit: Payer: Self-pay | Admitting: Gynecology

## 2015-08-17 DIAGNOSIS — M8580 Other specified disorders of bone density and structure, unspecified site: Secondary | ICD-10-CM

## 2015-08-17 DIAGNOSIS — Z13820 Encounter for screening for osteoporosis: Secondary | ICD-10-CM

## 2015-08-17 DIAGNOSIS — M899 Disorder of bone, unspecified: Secondary | ICD-10-CM | POA: Diagnosis not present

## 2015-08-17 DIAGNOSIS — E2839 Other primary ovarian failure: Secondary | ICD-10-CM

## 2015-08-19 ENCOUNTER — Other Ambulatory Visit: Payer: BLUE CROSS/BLUE SHIELD

## 2015-08-19 ENCOUNTER — Other Ambulatory Visit: Payer: Self-pay | Admitting: *Deleted

## 2015-08-19 DIAGNOSIS — M8580 Other specified disorders of bone density and structure, unspecified site: Secondary | ICD-10-CM

## 2015-08-20 LAB — VITAMIN D 25 HYDROXY (VIT D DEFICIENCY, FRACTURES): Vit D, 25-Hydroxy: 33 ng/mL (ref 30–100)

## 2015-08-20 LAB — PTH, INTACT AND CALCIUM
Calcium: 9.4 mg/dL (ref 8.4–10.5)
PTH: 16 pg/mL (ref 14–64)

## 2015-08-26 ENCOUNTER — Encounter: Payer: BLUE CROSS/BLUE SHIELD | Admitting: Gynecology

## 2015-09-30 ENCOUNTER — Ambulatory Visit (INDEPENDENT_AMBULATORY_CARE_PROVIDER_SITE_OTHER): Payer: BLUE CROSS/BLUE SHIELD | Admitting: Gynecology

## 2015-09-30 ENCOUNTER — Encounter: Payer: Self-pay | Admitting: Gynecology

## 2015-09-30 VITALS — BP 130/80 | Wt 182.0 lb

## 2015-09-30 DIAGNOSIS — Z01419 Encounter for gynecological examination (general) (routine) without abnormal findings: Secondary | ICD-10-CM

## 2015-09-30 DIAGNOSIS — Z79890 Hormone replacement therapy (postmenopausal): Secondary | ICD-10-CM | POA: Diagnosis not present

## 2015-09-30 DIAGNOSIS — Z8639 Personal history of other endocrine, nutritional and metabolic disease: Secondary | ICD-10-CM | POA: Diagnosis not present

## 2015-09-30 DIAGNOSIS — M8580 Other specified disorders of bone density and structure, unspecified site: Secondary | ICD-10-CM

## 2015-09-30 DIAGNOSIS — N951 Menopausal and female climacteric states: Secondary | ICD-10-CM

## 2015-09-30 DIAGNOSIS — R232 Flushing: Secondary | ICD-10-CM

## 2015-09-30 MED ORDER — ESTRADIOL 0.025 MG/24HR TD PTTW: 1.0000 | MEDICATED_PATCH | TRANSDERMAL | Status: DC

## 2015-09-30 NOTE — Patient Instructions (Signed)
Estradiol skin patches What is this medicine? ESTRADIOL (es tra DYE ole) skin patches contain an estrogen. It is mostly used as hormone replacement in menopausal women. It helps to treat hot flashes and prevent osteoporosis. It is also used to treat women with low estrogen levels or those who have had their ovaries removed. This medicine may be used for other purposes; ask your health care provider or pharmacist if you have questions. What should I tell my health care provider before I take this medicine? They need to know if you have any of these conditions: -abnormal vaginal bleeding -blood vessel disease or blood clots -breast, cervical, endometrial, ovarian, liver, or uterine cancer -dementia -diabetes -gallbladder disease -heart disease or recent heart attack -high blood pressure -high cholesterol -high level of calcium in the blood -hysterectomy -kidney disease -liver disease -migraine headaches -protein C deficiency -protein S deficiency -stroke -systemic lupus erythematosus (SLE) -tobacco smoker -an unusual or allergic reaction to estrogens, other hormones, medicines, foods, dyes, or preservatives -pregnant or trying to get pregnant -breast-feeding How should I use this medicine? This medicine is for external use only. Follow the directions on the prescription label. Tear open the pouch, do not use scissors. Remove the stiff protective liner covering the adhesive. Try not to touch the adhesive. Apply the patch, sticky side to the skin, to an area that is clean, dry and hairless. Avoid injured, irritated, calloused, or scarred areas. Do not apply the skin patches to your breasts or around the waistline. Use a different site each time to prevent skin irritation. Do not cut or trim the patch. Do not stop using except on the advice of your doctor or health care professional. Do not wear more than one patch at a time unless you are told to do so by your doctor or health care  professional. Contact your pediatrician regarding the use of this medicine in children. Special care may be needed. A patient package insert for the product will be given with each prescription and refill. Read this sheet carefully each time. The sheet may change frequently. Overdosage: If you think you have taken too much of this medicine contact a poison control center or emergency room at once. NOTE: This medicine is only for you. Do not share this medicine with others. What if I miss a dose? If you miss a dose, apply it as soon as you can. If it is almost time for your next dose, apply only that dose. Do not apply double or extra doses. What may interact with this medicine? Do not take this medicine with any of the following medications: -aromatase inhibitors like aminoglutethimide, anastrozole, exemestane, letrozole, testolactone This medicine may also interact with the following medications: -carbamazepine -certain antibiotics used to treat infections -certain barbiturates used for inducing sleep or treating seizures -grapefruit juice -medicines for fungus infections like itraconazole and ketoconazole -raloxifene or tamoxifen -rifabutin, rifampin, or rifapentine -ritonavir -St. John's Wort This list may not describe all possible interactions. Give your health care provider a list of all the medicines, herbs, non-prescription drugs, or dietary supplements you use. Also tell them if you smoke, drink alcohol, or use illegal drugs. Some items may interact with your medicine. What should I watch for while using this medicine? Visit your doctor or health care professional for regular checks on your progress. You will need a regular breast and pelvic exam and Pap smear while on this medicine. You should also discuss the need for regular mammograms with your health care professional, and  follow his or her guidelines for these tests. This medicine can make your body retain fluid, making your  fingers, hands, or ankles swell. Your blood pressure can go up. Contact your doctor or health care professional if you feel you are retaining fluid. If you have any reason to think you are pregnant, stop taking this medicine right away and contact your doctor or health care professional. Smoking increases the risk of getting a blood clot or having a stroke while you are taking this medicine, especially if you are more than 64 years old. You are strongly advised not to smoke. If you wear contact lenses and notice visual changes, or if the lenses begin to feel uncomfortable, consult your eye doctor or health care professional. This medicine can increase the risk of developing a condition (endometrial hyperplasia) that may lead to cancer of the lining of the uterus. Taking progestins, another hormone drug, with this medicine lowers the risk of developing this condition. Therefore, if your uterus has not been removed (by a hysterectomy), your doctor may prescribe a progestin for you to take together with your estrogen. You should know, however, that taking estrogens with progestins may have additional health risks. You should discuss the use of estrogens and progestins with your health care professional to determine the benefits and risks for you. If you are going to have surgery or an MRI, you may need to stop taking this medicine. Consult your health care professional for advice before you schedule the surgery. You may bathe or participate in other activities while wearing your patch. If the patch pulls loose or falls off, you may reapply it if the patch is sticky enough to stay on the skin. You should reapply the patch in a different area. Use a fresh patch if it will no longer stick. What side effects may I notice from receiving this medicine? Side effects that you should report to your doctor or health care professional as soon as possible: -allergic reactions like skin rash, itching or hives, swelling of  the face, lips, or tongue -breast tissue changes or discharge -changes in vision -chest pain -confusion, trouble speaking or understanding -dark urine -general ill feeling or flu-like symptoms -light-colored stools -nausea, vomiting -pain, swelling, warmth in the leg -right upper belly pain -severe headaches -shortness of breath -sudden numbness or weakness of the face, arm or leg -trouble walking, dizziness, loss of balance or coordination -unusual vaginal bleeding -yellowing of the eyes or skin Side effects that usually do not require medical attention (report to your doctor or health care professional if they continue or are bothersome): -hair loss -increased hunger or thirst -increased urination -symptoms of vaginal infection like itching, irritation or unusual discharge -unusually weak or tired This list may not describe all possible side effects. Call your doctor for medical advice about side effects. You may report side effects to FDA at 1-800-FDA-1088. Where should I keep my medicine? Keep out of the reach of children. Store at room temperature below 30 degrees C (86 degrees F). Do not store any patches that have been removed from their protective pouch. Throw away any unused medicine after the expiration date. Dispose of used patches properly. Since used patches may still contain active hormones, fold the patch in half so that it sticks to itself prior to disposal. NOTE: This sheet is a summary. It may not cover all possible information. If you have questions about this medicine, talk to your doctor, pharmacist, or health care provider.  2016, [employer]vier/[employer] Standard. (2010-07-20 09:19:41)

## 2015-09-30 NOTE — Progress Notes (Signed)
Gabrielle Lynch 04-15-1951 [id number]   History:    65 y.o.  for annual gyn exam who has still been complaining of hot flashes especially in the evening. Review of her record indicated she was seen on 10/29/2014 to discuss her hormone replacement therapy.She had been on Climara transdermal patch 0.075 mg weekly.Review of her record indicated that in 2012 she had a total abdominal hysterectomy with bilateral salpingo-oophorectomy with concurrent suburethral sling Duane Lope). Patient had been on Estraderm replacement therapy for 10 years since her hysterectomy.We discussed the concerns as well as the women's health initiative study and potential risk of breast cancer. She had tapered off her transdermal patch and tried peppermint oil but has not helped with her symptoms. She had tried transdermal estrogen gel which caused her to have a skin rash. We even tried the Estring 2 mg intravaginally every 3 months. Following amount. She is on the Effexor 75 mg daily but has not helped much either. Her PCP has been doing her blood work. She had a bone density in 2017 osteopenia was noted but normal Frax analysis. Her colonoscopies are up-to-date. She has had history vitamin D deficiency in the past.  In May 2014 she had a LEEP excision of a vaginal cuff lesion in which the pathology report had demonstrated the following:  Diagnosis Vagina, cuff lesion - GRANULATION TISSUE. - NO EVIDENCE OF SQUAMOUS EPITHELIUM. - NO ATYPIA OR MALIGNANCY.  Patient reports no vaginal bleeding and her PCP has been doing her blood work.  Past medical history,surgical history, family history and social history were all reviewed and documented in the EPIC chart.  Gynecologic History No LMP recorded. Patient has had a hysterectomy. Contraception: none Last Pap: 2016. Results were: normal Last mammogram: 2016. Results were: normal  Obstetric History OB History  Gravida Para Term Preterm AB SAB TAB Ectopic Multiple Living  2 2         2     # Outcome Date GA Lbr Len/2nd Weight Sex Delivery Anes PTL Lv  2 Para           1 Para                ROS: A ROS was performed and pertinent positives and negatives are included in the history.  GENERAL: No fevers or chills. HEENT: No change in vision, no earache, sore throat or sinus congestion. NECK: No pain or stiffness. CARDIOVASCULAR: No chest pain or pressure. No palpitations. PULMONARY: No shortness of breath, cough or wheeze. GASTROINTESTINAL: No abdominal pain, nausea, vomiting or diarrhea, melena or bright red blood per rectum. GENITOURINARY: No urinary frequency, urgency, hesitancy or dysuria. MUSCULOSKELETAL: No joint or muscle pain, no back pain, no recent trauma. DERMATOLOGIC: No rash, no itching, no lesions. ENDOCRINE: No polyuria, polydipsia, no heat or cold intolerance. No recent change in weight. HEMATOLOGICAL: No anemia or easy bruising or bleeding. NEUROLOGIC: No headache, seizures, numbness, tingling or weakness. PSYCHIATRIC: No depression, no loss of interest in normal activity or change in sleep pattern.     Exam: chaperone present  BP 130/80 mmHg  Wt 182 lb (82.555 kg)  Body mass index is 31.22 kg/(m^2).  General appearance : Well developed well nourished female. No acute distress HEENT: Eyes: no retinal hemorrhage or exudates,  Neck supple, trachea midline, no carotid bruits, no thyroidmegaly Lungs: Clear to auscultation, no rhonchi or wheezes, or rib retractions  Heart: Regular rate and rhythm, no murmurs or gallops Breast:Examined in sitting and supine position were symmetrical  in appearance, no palpable masses or tenderness,  no skin retraction, no nipple inversion, no nipple discharge, no skin discoloration, no axillary or supraclavicular lymphadenopathy Abdomen: no palpable masses or tenderness, no rebound or guarding Extremities: no edema or skin discoloration or tenderness  Pelvic:  Bartholin, Urethra, Skene Glands: Within normal limits              Vagina: No gross lesions or discharge  Cervix: Absent  Uterus  absent  Adnexa  Without masses or tenderness  Anus and perineum  normal   Rectovaginal  normal sphincter tone without palpated masses or tenderness             Hemoccult  PCP provides   Assessment/Plan:  65 y.o. female for annual exam would like to go back on a very low dose estrogen patch she will be prescribed mini low 0.025 to apply twice a week. We did discuss once again the risks benefits and pros and cons of using a low-dose estrogen replacement therapy. We did discussed women's health initiative study. Patient will continue to do her monthly breast exam and annual breast exams by medical provider such as myself and also to continue with her yearly mammogram which she needs to schedule. We discussed importance of calcium vitamin D and weightbearing exercises for osteoporosis prevention. Pap smear was done today.    Terrance Mass MD, 2:16 PM 09/30/2015

## 2015-10-01 LAB — PAP IG W/ RFLX HPV ASCU

## 2015-10-21 ENCOUNTER — Telehealth: Payer: Self-pay

## 2015-10-21 MED ORDER — ESTRADIOL 0.05 MG/24HR TD PTTW: 1.0000 | MEDICATED_PATCH | TRANSDERMAL | Status: DC

## 2015-10-21 NOTE — Telephone Encounter (Signed)
Rx sent and patient informed.  

## 2015-10-21 NOTE — Telephone Encounter (Signed)
Patient said you prescribed Estradiol patch 0.025mg  for her and she has used it x 3 weeks and does not feel like it is helping very much. She asked if you might increase her to the next dose up?

## 2015-10-21 NOTE — Telephone Encounter (Signed)
Increase to .05 mg transdermal minivele patch twice a week. # 8 refill x 11

## 2015-11-29 ENCOUNTER — Other Ambulatory Visit: Payer: Self-pay | Admitting: Internal Medicine

## 2015-11-29 DIAGNOSIS — Z1231 Encounter for screening mammogram for malignant neoplasm of breast: Secondary | ICD-10-CM

## 2015-12-02 ENCOUNTER — Ambulatory Visit
Admission: RE | Admit: 2015-12-02 | Discharge: 2015-12-02 | Disposition: A | Discharge status: Home / Self Care | Payer: BLUE CROSS/BLUE SHIELD | Source: Ambulatory Visit | Attending: Internal Medicine | Admitting: Internal Medicine

## 2015-12-02 DIAGNOSIS — Z1231 Encounter for screening mammogram for malignant neoplasm of breast: Secondary | ICD-10-CM

## 2015-12-17 ENCOUNTER — Ambulatory Visit (INDEPENDENT_AMBULATORY_CARE_PROVIDER_SITE_OTHER): Payer: BLUE CROSS/BLUE SHIELD | Admitting: Psychology

## 2015-12-17 DIAGNOSIS — F3481 Disruptive mood dysregulation disorder: Secondary | ICD-10-CM | POA: Diagnosis not present

## 2016-01-19 ENCOUNTER — Ambulatory Visit: Payer: BLUE CROSS/BLUE SHIELD | Admitting: Endocrinology

## 2016-03-02 ENCOUNTER — Ambulatory Visit (INDEPENDENT_AMBULATORY_CARE_PROVIDER_SITE_OTHER): Payer: BLUE CROSS/BLUE SHIELD | Admitting: Psychology

## 2016-03-02 DIAGNOSIS — F3481 Disruptive mood dysregulation disorder: Secondary | ICD-10-CM

## 2016-03-07 ENCOUNTER — Encounter (HOSPITAL_COMMUNITY): Payer: Self-pay

## 2016-05-19 ENCOUNTER — Ambulatory Visit (INDEPENDENT_AMBULATORY_CARE_PROVIDER_SITE_OTHER): Payer: BLUE CROSS/BLUE SHIELD | Admitting: Psychology

## 2016-05-19 DIAGNOSIS — F3489 Other specified persistent mood disorders: Secondary | ICD-10-CM | POA: Diagnosis not present

## 2016-09-04 DIAGNOSIS — D2272 Melanocytic nevi of left lower limb, including hip: Secondary | ICD-10-CM | POA: Diagnosis not present

## 2016-09-04 DIAGNOSIS — D2262 Melanocytic nevi of left upper limb, including shoulder: Secondary | ICD-10-CM | POA: Diagnosis not present

## 2016-09-04 DIAGNOSIS — L814 Other melanin hyperpigmentation: Secondary | ICD-10-CM | POA: Diagnosis not present

## 2016-09-04 DIAGNOSIS — L433 Subacute (active) lichen planus: Secondary | ICD-10-CM | POA: Diagnosis not present

## 2016-09-04 DIAGNOSIS — D2372 Other benign neoplasm of skin of left lower limb, including hip: Secondary | ICD-10-CM | POA: Diagnosis not present

## 2016-09-04 DIAGNOSIS — D225 Melanocytic nevi of trunk: Secondary | ICD-10-CM | POA: Diagnosis not present

## 2016-09-04 DIAGNOSIS — D1801 Hemangioma of skin and subcutaneous tissue: Secondary | ICD-10-CM | POA: Diagnosis not present

## 2016-09-04 DIAGNOSIS — D2271 Melanocytic nevi of right lower limb, including hip: Secondary | ICD-10-CM | POA: Diagnosis not present

## 2016-09-04 DIAGNOSIS — L821 Other seborrheic keratosis: Secondary | ICD-10-CM | POA: Diagnosis not present

## 2016-09-04 DIAGNOSIS — I788 Other diseases of capillaries: Secondary | ICD-10-CM | POA: Diagnosis not present

## 2016-09-06 ENCOUNTER — Ambulatory Visit (INDEPENDENT_AMBULATORY_CARE_PROVIDER_SITE_OTHER): Payer: Medicare Other | Admitting: Obstetrics and Gynecology

## 2016-09-06 ENCOUNTER — Encounter: Payer: Self-pay | Admitting: Obstetrics and Gynecology

## 2016-09-06 ENCOUNTER — Encounter: Payer: BLUE CROSS/BLUE SHIELD | Admitting: Obstetrics and Gynecology

## 2016-09-06 VITALS — BP 118/72 | HR 76 | Resp 18 | Ht 63.75 in | Wt 171.8 lb

## 2016-09-06 DIAGNOSIS — Z01419 Encounter for gynecological examination (general) (routine) without abnormal findings: Secondary | ICD-10-CM | POA: Diagnosis not present

## 2016-09-06 DIAGNOSIS — N816 Rectocele: Secondary | ICD-10-CM

## 2016-09-06 DIAGNOSIS — N951 Menopausal and female climacteric states: Secondary | ICD-10-CM

## 2016-09-06 MED ORDER — ESTRADIOL 10 MCG VA TABS: ORAL_TABLET | VAGINAL | 12 refills | Status: DC

## 2016-09-06 MED ORDER — GABAPENTIN 100 MG PO CAPS: 100.0000 mg | ORAL_CAPSULE | Freq: Every day | ORAL | 2 refills | Status: DC

## 2016-09-06 NOTE — Patient Instructions (Signed)

## 2016-09-06 NOTE — Progress Notes (Signed)
66 y.o. G2P2 Married Caucasian female here for annual exam.    Has polymyositis rheumatica for 2 years. This was preceded by an infection, campylobacter.  Weaning off prednisone.  This has caused diabetes.   Has decreased libido. Vaginal dryness.  Estring did not stay in.   Is now off transdermal estrogen.  Does feel warm in general.  This was occurring even on the estrogen.   Is on Effexor and this has not helped the hot flashes a lot.  Daughter in [place].  Son in [place].   PCP: R.Marcellus Scott, MD   Endocrinology:  Roque Cash, MD Rheumatology:  Gavin Pound, MD Hematology: Griffin Basil, MD -  [facility]  No LMP recorded. Patient has had a hysterectomy.           Sexually active: Yes.    The current method of family planning is status post hysterectomy.    Exercising: No.   Smoker:  no  Health Maintenance: Pap:  2017 normal per patient History of abnormal Pap:  Yes, Hx of cryotherapy, Conization and LEEP.  Thinks she may have had mild dysplasia.  Granulation tissue noted.  MMG: 12-02-15 Density A/Neg/BiRads1:TBC Colonoscopy:  2013/2014 normal with Dr.Medoff;next due 2018/2019 BMD:   2017  Result :normal per patient:Dr.Gottsegen's office TDaP:  PCP Gardasil:   no HIV: Neg Hep C: Unsure Screening Labs:  Hb today: PCP or Endocrinology, Urine today: not done   reports that she has never smoked. She has never used smokeless tobacco. She reports that she does not drink alcohol or use drugs.  Past Medical History:  Diagnosis Date  . Abnormal Pap smear of cervix    Hx of cryotherapy to cervix in her 65s  . Abnormal uterine bleeding    history of fibroids  . Anemia   . Diabetes mellitus without complication ([place])   . DJD (degenerative joint disease)   . Fibroid   . GERD (gastroesophageal reflux disease)   . Heart murmur    functional  . History of hypertension NO MEDS SINCE LAP GASTRIC BAND 2010 --  WT. LOSS  . Hypertension   . Hypothyroidism   . 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  STATES SHE CARRIES EPI PEN--BUT HAS NOT HAD ANY RECENT EPISODES.  STATES MANY OF THE MEDICATIONS SHE TAKES ARE TO HELP THIS PROBLEM - INCLUDING  Ketotifen-mast cell inhibitor-not approved in [place].   . Migraine   . Osteopenia   . PMR (polymyalgia rheumatica) ([facility])   . PONV (postoperative nausea and vomiting)   . Spondylosis     Past Surgical History:  Procedure Laterality Date  . ABDOMINAL HYSTERECTOMY  02/20/2011   Procedure: HYSTERECTOMY ABDOMINAL;  Surgeon: Bennetta Laos, MD;  Location: [facility];  Service: Gynecology;  Laterality: N/A;  . CERVICAL BIOPSY  W/ LOOP ELECTRODE EXCISION  1992  . CERVIX LESION DESTRUCTION    . [place]   X2  . CHOLECYSTECTOMY    . DILATION AND CURETTAGE OF UTERUS     heavy menses  . HYSTEROSCOPY  06/1995   HYSTEROSCOPIC MYOMECTOMY  . HYSTEROSCOPY  06/1998   D&C, HYSTEROSCOPY  . KNEE SURGERY     X2 right knee  . LAPAROSCOPIC GASTRIC BANDING  08-03-2008   [facility]PS [facility]  . LESION REMOVAL N/A 09/02/2012   Procedure: EXCISION VAGINAL LESION;  Surgeon: Terrance Mass, MD;  Location: [facility];  Service: Gynecology;  Laterality: N/A;  cpt 57100  one hour  . PUBOVAGINAL SLING  02/20/2011   Procedure: Gaynelle Arabian;  Surgeon: Ailene Rud, MD;  Location: [facility];  Service: Urology;  Laterality: N/A;  . SALPINGOOPHORECTOMY  02/20/2011   Procedure: SALPINGO OOPHERECTOMY;  Surgeon: Bennetta Laos, MD;  Location: [facility];  Service: Gynecology;  Laterality: Bilateral;  . UPPER GASTROINTESTINAL ENDOSCOPY  06/2011  . VENTRAL HERNIA REPAIR  05/24/2011   Procedure: LAPAROSCOPIC VENTRAL HERNIA;  Surgeon: Pedro Earls, MD;  Location: [facility];  Service: General;  Laterality: N/A;    Current Outpatient Prescriptions  Medication Sig Dispense Refill  . ascorbic acid (VITAMIN  C) 500 MG tablet Take 500 mg by mouth daily.    . cetirizine (ZYRTEC) 10 MG tablet Take 10 mg by mouth daily.     . clobetasol cream (TEMOVATE) 0.05 % APPLY TO AFFECTED AREA TWICE A DAY. 45 g 1  . clonazePAM (KLONOPIN) 1 MG tablet Take 1 mg by mouth at bedtime as needed for anxiety.    Marland Kitchen EPINEPHrine (EPI-PEN) 0.3 mg/0.3 mL DEVI Inject into the muscle as needed.    . fenofibrate 160 MG tablet Take 160 mg by mouth daily.    Marland Kitchen FINASTERIDE PO Take 2.5 mg by mouth daily.     . hydrochlorothiazide (HYDRODIURIL) 25 MG tablet Take 25 mg by mouth daily.    . hydrOXYzine (ATARAX/VISTARIL) 50 MG tablet Take 1 tablet by mouth 2 (two) times daily.    . insulin aspart (NOVOLOG) cartridge Inject into the skin 3 (three) times daily with meals. Patient has insulin pump    . irbesartan (AVAPRO) 150 MG tablet Take 1 tablet by mouth daily.    Marland Kitchen levothyroxine (SYNTHROID, LEVOTHROID) 137 MCG tablet Take 137 mcg by mouth daily before breakfast.    . Linagliptin-Metformin HCl (JENTADUETO) 2.5-850 MG TABS Take 1 tablet by mouth 2 (two) times daily.    . montelukast (SINGULAIR) 10 MG tablet Take 10 mg by mouth at bedtime.     . NON FORMULARY Ketotifen 5mg  bid    . PREDNISONE PO Take 30 mg by mouth daily.    Marland Kitchen PRESCRIPTION MEDICATION Take 1 tablet by mouth 2 (two) times daily. Ketotifen 5mg  tablet, [place]. For mast cell problem    . ranitidine (ZANTAC) 300 MG tablet Take 300 mg by mouth at bedtime.    . topiramate (TOPAMAX) 50 MG tablet Take 50 mg by mouth 2 (two) times daily.     Marland Kitchen venlafaxine XR (EFFEXOR-XR) 75 MG 24 hr capsule Take 75 mg by mouth daily.    Marland Kitchen zolmitriptan (ZOMIG) 5 MG tablet Take 5 mg by mouth as needed for migraine.     No current facility-administered medications for this visit.     Family History  Problem Relation Age of Onset  . Breast cancer Mother     78's  . Hypertension Mother   . Thyroid disease Mother     hypothyroid  . Cancer Father     LUNG  . Thyroid  disease Maternal Grandmother     hypothyroid    ROS:  Pertinent items are noted in HPI.  Otherwise, a comprehensive ROS was negative.  Exam:   BP 118/72 (BP Location: Right Arm, Patient Position: Sitting, Cuff Size: Normal)   Pulse 76   Resp 18   Ht 5' 3.75" (1.619 m)   Wt 171 lb 12.8 oz (77.9 kg)   BMI  29.72 kg/m     General appearance: alert, cooperative and appears stated age Head: Normocephalic, without obvious abnormality, atraumatic Neck: no adenopathy, supple, symmetrical, trachea midline and thyroid normal to inspection and palpation Lungs: clear to auscultation bilaterally Breasts: normal appearance, no masses or tenderness, No nipple retraction or dimpling, No nipple discharge or bleeding, No axillary or supraclavicular adenopathy Heart: regular rate and rhythm Abdomen: soft, non-tender; no masses, no organomegaly Extremities: extremities normal, atraumatic, no cyanosis or edema Skin: Skin color, texture, turgor normal. No rashes or lesions Lymph nodes: Cervical, supraclavicular, and axillary nodes normal. No abnormal inguinal nodes palpated Neurologic: Grossly normal  Pelvic: External genitalia:  no lesions              Urethra:  normal appearing urethra with no masses, tenderness or lesions              Bartholins and Skenes: normal                 Vagina: normal appearing vagina with normal color and discharge, no lesions.  Anterior vaginal wall and cuff supported.  Second degree rectocele.              Cervix:  absent.                Pap taken: Yes.   Bimanual Exam:  Uterus:  Absent.               Adnexa: no mass, fullness, tenderness              Rectal exam: Yes.  .  Confirms.              Anus:  normal sphincter tone, no lesions  Chaperone was present for exam.  Assessment:   Well woman visit with normal exam. Status post total abdominal hysterectomy for fibroids. Status post BSO.  Status post midurethral sling.  Hx of abnormal paps and cryo, LEEP, and  conization of cervix.  Decreased libido.  Rectocele.  Menopausal symptoms.  On Effexor.  Diabetes.  Plan: Mammogram screening discussed. Recommended self breast awareness. Pap and HR HPV as above. Will need to get pap results. Guidelines for Calcium, Vitamin D, regular exercise program including cardiovascular and weight bearing exercise. Will prescribe Vagifem.  Discussed risks of use of estrogens - stroke, DVT, PE, and potential breast cancer.  The local estrogen dosage, however, is much less than that for ERT, so these risks are all very small.  Will start Gabapentin 100 mg po q hs for hot flashes.  Will will need to work up to 300 mg q hs.  We discussed her rectocele and possible treatments including a pessary and rectocele repair.  She wishes to return for a pessary fitting.  ACOG information on prolapse to patient.  We talked about testosterone for her decreased libido, but she in on Finasteride. She may do well just to stop the Effexor to see if this improves her libido.  Follow up annually and prn.    After visit summary provided.

## 2016-09-07 DIAGNOSIS — Z01419 Encounter for gynecological examination (general) (routine) without abnormal findings: Secondary | ICD-10-CM | POA: Diagnosis not present

## 2016-09-07 DIAGNOSIS — Z1272 Encounter for screening for malignant neoplasm of vagina: Secondary | ICD-10-CM | POA: Diagnosis not present

## 2016-09-08 LAB — IPS PAP SMEAR ONLY

## 2016-09-11 ENCOUNTER — Encounter: Payer: BLUE CROSS/BLUE SHIELD | Admitting: Obstetrics and Gynecology

## 2016-09-13 ENCOUNTER — Encounter: Payer: Self-pay | Admitting: Obstetrics and Gynecology

## 2016-09-13 ENCOUNTER — Ambulatory Visit (INDEPENDENT_AMBULATORY_CARE_PROVIDER_SITE_OTHER): Payer: Medicare Other | Admitting: Obstetrics and Gynecology

## 2016-09-13 ENCOUNTER — Encounter: Payer: Self-pay | Admitting: Gynecology

## 2016-09-13 ENCOUNTER — Telehealth: Payer: Self-pay

## 2016-09-13 VITALS — BP 110/68 | HR 76 | Resp 16 | Ht 63.75 in | Wt 171.0 lb

## 2016-09-13 DIAGNOSIS — N951 Menopausal and female climacteric states: Secondary | ICD-10-CM

## 2016-09-13 DIAGNOSIS — R6882 Decreased libido: Secondary | ICD-10-CM | POA: Diagnosis not present

## 2016-09-13 DIAGNOSIS — N816 Rectocele: Secondary | ICD-10-CM

## 2016-09-13 MED ORDER — GABAPENTIN 100 MG PO CAPS: ORAL_CAPSULE | ORAL | 2 refills | Status: DC

## 2016-09-13 MED ORDER — VENLAFAXINE HCL ER 37.5 MG PO CP24: 37.5000 mg | ORAL_CAPSULE | Freq: Every day | ORAL | 4 refills | Status: DC

## 2016-09-13 NOTE — Telephone Encounter (Signed)
-----   Message from Nunzio Cobbs, MD sent at 09/08/2016  1:46 PM EDT ----- Please report normal pap to patient.  There was inflammation which was seen which is a nonspecific finding and requires no additional evaluation or treatment.  This is not caused from polymyositis or the rectocele.  I will see her next week for the pessary fitting!

## 2016-09-13 NOTE — Telephone Encounter (Signed)
Spoke with patient. Results given as seen below from [place]. Patient is agreeable and verbalizes understanding. 02 recall placed.  Routing to provider for final review. Patient agreeable to disposition. Will close encounter.

## 2016-09-13 NOTE — Progress Notes (Signed)
GYNECOLOGY  VISIT   HPI: 66 y.o.   Married  Caucasian  female   G2P2 with No LMP recorded. Patient has had a hysterectomy.   here for pessary fitting.   Has second degree rectocele.   Decrease libido more recently and suddenly.  Hot flashes.  On Effexor long term and not sure if it is helping.  Denies depression or anxiety symptoms.   Started Vagifem and Gabapentin.  Difficulty placing the Vagifem.  Feels an obstruction.  No muscular pain.   GYNECOLOGIC HISTORY: No LMP recorded. Patient has had a hysterectomy. Contraception:  Hysterectomy Menopausal hormone therapy:  Vagifem Last mammogram:  12/02/15 [facility] Last pap smear:   09/07/16 Neg         OB History    Gravida Para Term Preterm AB Living   2 2       2    SAB TAB Ectopic Multiple Live Births                     Patient Active Problem List   Diagnosis Date Noted  . Postmenopausal HRT (hormone replacement therapy) 10/29/2014  . Hot flashes, menopausal 10/29/2014  . Fitting and adjustment of gastric lap band 01/14/2013  . Obese 01/14/2013  . H/O vitamin D deficiency 08/09/2012  . Unspecified hypothyroidism 08/09/2012  . Menopause 08/09/2012  . Postoperative seroma-in ventral hernia repair site 07/20/2011  . Lapband APS April 2010 07/20/2011  . Hypertension   . Mast cell disorder   . DEGENERATIVE JOINT DISEASE 02/19/2008  . SPONDYLOSIS UNSPEC SITE W/O MENTION MYELOPATHY 02/19/2008  . SINUS TARSI SYNDROME 02/19/2008  . UNEQUAL LEG LENGTH 02/19/2008    Past Medical History:  Diagnosis Date  . Abnormal Pap smear of cervix    Hx of cryotherapy to cervix in her 84s  . Abnormal uterine bleeding    history of fibroids  . Anemia   . Diabetes mellitus without complication ([place])   . DJD (degenerative joint disease)   . Fibroid   . GERD (gastroesophageal reflux disease)   . Heart murmur    functional  . History of hypertension NO MEDS SINCE LAP GASTRIC BAND 2010 --  WT. LOSS  .  Hypertension   . Hypothyroidism   . 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  STATES SHE CARRIES EPI PEN--BUT HAS NOT HAD ANY RECENT EPISODES.  STATES MANY OF THE MEDICATIONS SHE TAKES ARE TO HELP THIS PROBLEM - INCLUDING  Ketotifen-mast cell inhibitor-not approved in [place].   . Migraine   . Osteopenia   . PMR (polymyalgia rheumatica) ([facility])   . PONV (postoperative nausea and vomiting)   . Spondylosis     Past Surgical History:  Procedure Laterality Date  . ABDOMINAL HYSTERECTOMY  02/20/2011   Procedure: HYSTERECTOMY ABDOMINAL;  Surgeon: Bennetta Laos, MD;  Location: [facility];  Service: Gynecology;  Laterality: N/A;  . CERVICAL BIOPSY  W/ LOOP ELECTRODE EXCISION  1992  . CERVIX LESION DESTRUCTION    . [place]   X2  . CHOLECYSTECTOMY    . DILATION AND CURETTAGE OF UTERUS     heavy menses  . HYSTEROSCOPY  06/1995   HYSTEROSCOPIC MYOMECTOMY  . HYSTEROSCOPY  06/1998   D&C, HYSTEROSCOPY  . KNEE SURGERY     X2 right knee  . LAPAROSCOPIC GASTRIC BANDING  08-03-2008   [facility] APS  SYSTEM  . LESION REMOVAL N/A 09/02/2012   Procedure: EXCISION VAGINAL LESION;  Surgeon: Terrance Mass, MD;  Location: [facility];  Service: Gynecology;  Laterality: N/A;  cpt 57100  one hour  . PUBOVAGINAL SLING  02/20/2011   Procedure: Gaynelle Arabian;  Surgeon: Ailene Rud, MD;  Location: [facility];  Service: Urology;  Laterality: N/A;  . SALPINGOOPHORECTOMY  02/20/2011   Procedure: SALPINGO OOPHERECTOMY;  Surgeon: Bennetta Laos, MD;  Location: [facility];  Service: Gynecology;  Laterality: Bilateral;  . UPPER GASTROINTESTINAL ENDOSCOPY  06/2011  . VENTRAL HERNIA REPAIR  05/24/2011   Procedure: LAPAROSCOPIC VENTRAL HERNIA;  Surgeon: Pedro Earls, MD;  Location: [facility];  Service: General;  Laterality: N/A;    Current Outpatient Prescriptions   Medication Sig Dispense Refill  . ascorbic acid (VITAMIN C) 500 MG tablet Take 500 mg by mouth daily.    . cetirizine (ZYRTEC) 10 MG tablet Take 10 mg by mouth daily.     . clobetasol cream (TEMOVATE) 0.05 % APPLY TO AFFECTED AREA TWICE A DAY. 45 g 1  . clonazePAM (KLONOPIN) 1 MG tablet Take 1 mg by mouth at bedtime as needed for anxiety.    Marland Kitchen EPINEPHrine (EPI-PEN) 0.3 mg/0.3 mL DEVI Inject into the muscle as needed.    . Estradiol (VAGIFEM) 10 MCG TABS vaginal tablet Use every night before bed for two weeks when you first begin this medicine, then after the first two weeks, begin using it twice a week. 18 tablet 12  . fenofibrate 160 MG tablet Take 160 mg by mouth daily.    . finasteride (PROSCAR) 5 MG tablet     . gabapentin (NEURONTIN) 100 MG capsule Take 1 capsule (100 mg total) by mouth at bedtime. 30 capsule 2  . hydrochlorothiazide (HYDRODIURIL) 25 MG tablet Take 25 mg by mouth daily.    . hydrOXYzine (ATARAX/VISTARIL) 50 MG tablet Take 1 tablet by mouth 2 (two) times daily.    . insulin aspart (NOVOLOG) cartridge Inject into the skin 3 (three) times daily with meals. Patient has insulin pump    . irbesartan (AVAPRO) 150 MG tablet Take 1 tablet by mouth daily.    Marland Kitchen levothyroxine (SYNTHROID, LEVOTHROID) 137 MCG tablet Take 137 mcg by mouth daily before breakfast.    . Linagliptin-Metformin HCl (JENTADUETO) 2.5-850 MG TABS Take 1 tablet by mouth 2 (two) times daily.    . montelukast (SINGULAIR) 10 MG tablet Take 10 mg by mouth at bedtime.     . NON FORMULARY Ketotifen 5mg  bid    . PREDNISONE PO Take 30 mg by mouth daily.    Marland Kitchen PRESCRIPTION MEDICATION Take 1 tablet by mouth 2 (two) times daily. Ketotifen 5mg  tablet[place]. For mast cell problem    . ranitidine (ZANTAC) 300 MG tablet Take 300 mg by mouth at bedtime.    . tacrolimus (PROGRAF) 1 MG capsule     . topiramate (TOPAMAX) 50 MG tablet Take 50 mg by mouth 2 (two) times daily.     Marland Kitchen venlafaxine XR  (EFFEXOR-XR) 75 MG 24 hr capsule Take 75 mg by mouth daily.    . Vitamin D, Ergocalciferol, (DRISDOL) 50000 units CAPS capsule     . zolmitriptan (ZOMIG) 5 MG tablet Take 5 mg by mouth as needed for migraine.     No current facility-administered medications for this visit.      ALLERGIES: Meperidine and Demerol  Family History  Problem Relation Age of Onset  .  Breast cancer Mother        2's  . Hypertension Mother   . Thyroid disease Mother        hypothyroid  . Cancer Father        LUNG  . Thyroid disease Maternal Grandmother        hypothyroid    Social History   Social History  . Marital status: Married    Spouse name: N/A  . Number of children: N/A  . Years of education: N/A   Occupational History  . Not on file.   Social History Main Topics  . Smoking status: Never Smoker  . Smokeless tobacco: Never Used  . Alcohol use No     Comment: social  . Drug use: No  . Sexual activity: Yes    Partners: Male    Birth control/ protection: Surgical     Comment: TAH/BSO   Other Topics Concern  . Not on file   Social History Narrative  . No narrative on file    ROS:  Pertinent items are noted in HPI.  PHYSICAL EXAMINATION:    BP 110/68 (BP Location: Right Arm, Patient Position: Sitting, Cuff Size: Normal)   Pulse 76   Resp 16   Ht 5' 3.75" (1.619 m)   Wt 171 lb (77.6 kg)   BMI 29.58 kg/m     General appearance: alert, cooperative and appears stated age   Pessary fitting:   Ring with support pessary #4, #3, and #2 all uncomfortable.  Vaginal length somewhat shortened. Rectocele present at second degree.  Chaperone was present for exam.  ASSESSMENT  Status post total abdominal hysterectomy for fibroids. Status post BSO.  Status post midurethral sling.  Decreased libido.  I think her recent medical issues may have impacted this significantly.  The Effexor may be playing a role.  I am not recommending testosterone as she is on Finasteride for hair  loss. Rectocele.  Pessary is not a good option due to the patient's anatomy.  Patient interested in surgical repair.  Menopausal symptoms.  On Effexor.   Diabetes. Polymyositis.   PLAN  Refills of Vagifem already done. We discussed wearing off the Effexor and increasing the Gabapentin. We will start taking Effexor 37.5 mg alternating with 75 mg daily.  Increase Gabapentin to 200 mg in 2 weeks.  May need to increase to 300 mg. We discussed the rare side effect of muscle pain, rhabdomyolysis. Follow up in August after her son's international wedding.  Rectocele repair with native tissue repair in September. On chart review, we will need to have medical clearance for surgery.   An After Visit Summary was printed and given to the patient.  ___25___ minutes face to face time of which over 50% was spent in counseling.

## 2016-09-28 ENCOUNTER — Telehealth: Payer: Self-pay | Admitting: Obstetrics and Gynecology

## 2016-09-28 NOTE — Telephone Encounter (Signed)
Spoke with patient. Patient reports she was to attempt to wean off of Effexor by alternating 75 mg daily to 37.5 mg daily. Patient states hot flashes increased and got too bad when decreasing dose, so she has continued 75 mg daily. Patient reports she has not increased Gabapentin, still taking 100 mg daily, unsure if ok to increase since Effexor not decreased. Started Gabapentin on 09/13/16. Patient states she would also like Dr. Quincy Simmonds to know she would like to move forward with planning and scheduling rectocele repair surgery as previously discussed. Advised patient would update and review with Dr. Quincy Simmonds and return call with recommendations, advised patient Dr. Quincy Simmonds is still seeing patients, response may not be immediate, patient is agreeable.   Dr. Quincy Simmonds, please review and advise?

## 2016-09-28 NOTE — Telephone Encounter (Signed)
Patient is still having hot flashes and would like to discuss having rectocele surgery.

## 2016-09-28 NOTE — Telephone Encounter (Signed)
Spoke with patient, advised as seen below per Dr. Quincy Simmonds.  Patient is aware will receive call back from [facility] in our insurance/benefits department and Gay Filler for scheduling.  Patient verbalizes understanding and is agreeable.   Routing to provider for final review. Patient is agreeable to disposition. Will close encounter.

## 2016-09-28 NOTE — Telephone Encounter (Signed)
Ok to continue with the Effexor 75 mg daily.  Can take Gabapentin 200 mg q hs.   Ok to precert and schedule rectocele repair. Time needed is about 1.25 hours.   Arp Malachi Pro

## 2016-10-02 DIAGNOSIS — L438 Other lichen planus: Secondary | ICD-10-CM | POA: Diagnosis not present

## 2016-10-02 DIAGNOSIS — K1329 Other disturbances of oral epithelium, including tongue: Secondary | ICD-10-CM | POA: Diagnosis not present

## 2016-10-03 ENCOUNTER — Telehealth: Payer: Self-pay | Admitting: *Deleted

## 2016-10-03 NOTE — Telephone Encounter (Signed)
Call to patient. Surgery date options discussed. Patient requests to proceed on 01-02-17. Surgery instruction sheet reviewed and printed copy will be mailed to patient with brochure to surgery center. Patient to call back as needed.   Routing to provider for final review. Patient agreeable to disposition. Will close encounter.

## 2016-10-03 NOTE — Telephone Encounter (Signed)
Call to patient. Per Dr Quincy Simmonds, patient will need medical clearance from PCP for surgery. Denies need for assistance in scheduling appointment. States she can schedule this.  Advised we will need office note or letter from PCP stating she is cleared. Patient voiced understanding and is agreeable. Advised Dr Quincy Simmonds will review current plan for surgery at [facility] and will call her back if any changes recommended by Dr Quincy Simmonds.

## 2016-10-05 ENCOUNTER — Telehealth: Payer: Self-pay | Admitting: *Deleted

## 2016-10-05 NOTE — Telephone Encounter (Signed)
Call to patient, advised Dr Quincy Simmonds recommends [facility] for surgical procedure. Alternative date options discussed. Will move surgery date to 12-19-16 at [facility]. Patient has surgery consult with PCP on 12-13-16 and could not get earlier appointment.  Discussed that may not be enough time for PCP to see her, order any needed testing and still clear her for surgery. She will contact PCP and request earlier appointment. Desires to proceed with 12-19-16 surgery date. Due to travel for sons wedding, unable to move consult with Dr Quincy Simmonds up. Currently scheduled for 12-13-16. Patient traveling 11-17-16 thru 12-11-16.  Advised Dr Quincy Simmonds will review surgical plan and will call back if additional recommendations.

## 2016-10-05 NOTE — Telephone Encounter (Signed)
Surgery date confirmed for 12-19-16. Surgery instruction sheet mailed with updated information.  Encounter closed.

## 2016-10-05 NOTE — Telephone Encounter (Signed)
Ok for August 21 for rectocele repair.  I agree that her clearance with her PCP needs to be sooner than a couple of days prior to surgery.

## 2016-10-13 DIAGNOSIS — B370 Candidal stomatitis: Secondary | ICD-10-CM | POA: Diagnosis not present

## 2016-10-13 DIAGNOSIS — L439 Lichen planus, unspecified: Secondary | ICD-10-CM | POA: Diagnosis not present

## 2016-10-13 DIAGNOSIS — K1370 Unspecified lesions of oral mucosa: Secondary | ICD-10-CM | POA: Diagnosis not present

## 2016-10-16 ENCOUNTER — Ambulatory Visit (INDEPENDENT_AMBULATORY_CARE_PROVIDER_SITE_OTHER): Payer: Medicare Other | Admitting: Psychology

## 2016-10-16 DIAGNOSIS — F3489 Other specified persistent mood disorders: Secondary | ICD-10-CM | POA: Diagnosis not present

## 2016-10-20 DIAGNOSIS — K1370 Unspecified lesions of oral mucosa: Secondary | ICD-10-CM | POA: Diagnosis not present

## 2016-11-10 ENCOUNTER — Telehealth: Payer: Self-pay | Admitting: *Deleted

## 2016-11-10 NOTE — Telephone Encounter (Signed)
Patient returned call

## 2016-11-10 NOTE — Telephone Encounter (Signed)
Return call to patient. States she simply "forgot " about PCP  Surgical clearance and will call right now for appointment. Again, declines assistance with scheduling. States has endocrinology appointment scheduled next week and A1C will be done at that time.

## 2016-11-10 NOTE — Telephone Encounter (Signed)
Call to patient for update on PCP appointment for surgical clearance. Left message to call back.

## 2016-11-14 ENCOUNTER — Ambulatory Visit: Payer: Medicare Other | Admitting: Psychology

## 2016-11-14 DIAGNOSIS — M353 Polymyalgia rheumatica: Secondary | ICD-10-CM | POA: Diagnosis not present

## 2016-11-14 DIAGNOSIS — Z6828 Body mass index (BMI) 28.0-28.9, adult: Secondary | ICD-10-CM | POA: Diagnosis not present

## 2016-11-14 DIAGNOSIS — M2550 Pain in unspecified joint: Secondary | ICD-10-CM | POA: Diagnosis not present

## 2016-11-14 DIAGNOSIS — Z7952 Long term (current) use of systemic steroids: Secondary | ICD-10-CM | POA: Diagnosis not present

## 2016-11-14 DIAGNOSIS — E663 Overweight: Secondary | ICD-10-CM | POA: Diagnosis not present

## 2016-11-14 DIAGNOSIS — M316 Other giant cell arteritis: Secondary | ICD-10-CM | POA: Diagnosis not present

## 2016-11-15 DIAGNOSIS — E559 Vitamin D deficiency, unspecified: Secondary | ICD-10-CM | POA: Diagnosis not present

## 2016-11-15 DIAGNOSIS — I10 Essential (primary) hypertension: Secondary | ICD-10-CM | POA: Diagnosis not present

## 2016-11-15 DIAGNOSIS — Z01818 Encounter for other preprocedural examination: Secondary | ICD-10-CM | POA: Diagnosis not present

## 2016-11-15 DIAGNOSIS — E038 Other specified hypothyroidism: Secondary | ICD-10-CM | POA: Diagnosis not present

## 2016-11-15 DIAGNOSIS — Z1389 Encounter for screening for other disorder: Secondary | ICD-10-CM | POA: Diagnosis not present

## 2016-11-15 DIAGNOSIS — D649 Anemia, unspecified: Secondary | ICD-10-CM | POA: Diagnosis not present

## 2016-11-15 DIAGNOSIS — Z6828 Body mass index (BMI) 28.0-28.9, adult: Secondary | ICD-10-CM | POA: Diagnosis not present

## 2016-11-15 DIAGNOSIS — N183 Chronic kidney disease, stage 3 (moderate): Secondary | ICD-10-CM | POA: Diagnosis not present

## 2016-11-15 DIAGNOSIS — E785 Hyperlipidemia, unspecified: Secondary | ICD-10-CM | POA: Diagnosis not present

## 2016-11-15 DIAGNOSIS — M353 Polymyalgia rheumatica: Secondary | ICD-10-CM | POA: Diagnosis not present

## 2016-11-15 DIAGNOSIS — Z9884 Bariatric surgery status: Secondary | ICD-10-CM | POA: Diagnosis not present

## 2016-11-15 DIAGNOSIS — E1129 Type 2 diabetes mellitus with other diabetic kidney complication: Secondary | ICD-10-CM | POA: Diagnosis not present

## 2016-11-16 NOTE — Telephone Encounter (Signed)
Patient reports she was seen by Dr Forde Dandy who cleared her for surgery. She cant see Dr gates till week before surgery due to his travel plans.

## 2016-11-16 NOTE — Telephone Encounter (Signed)
Return call to patient. Reports new symptoms of fecal incontinence. Occurs frequently with flatus and may occur after BM where stool remains after wiping.  Wants to know if this will improve after surgery or needs treatment? Also needs to know if colonoscopy recommended before or after surgery?

## 2016-11-16 NOTE — Telephone Encounter (Signed)
I expect her fecal incontinence and gas control to improve following her rectocele repair.  I am understanding that this is new onset, so I believe this to be due to anatomic change from the rectocele. If it is possible to have her colonoscopy prior to surgery, I support this. If it is not possible, then this can be done 3 months after surgery.

## 2016-11-16 NOTE — Telephone Encounter (Signed)
Patient has questions about her diagnosis.  She wants to know if rectal leakage is a symptom of her diagnosis.  Also she is due for colonoscopy and wants to know if she should get that done before or after surgery.

## 2016-11-17 NOTE — Telephone Encounter (Signed)
Call to patient. Advised of instructions from Dr Quincy Simmonds. Patient agreeable and appreciative. Encounter closed.

## 2016-11-22 ENCOUNTER — Telehealth: Payer: Self-pay | Admitting: *Deleted

## 2016-11-22 ENCOUNTER — Encounter: Payer: Self-pay | Admitting: Obstetrics and Gynecology

## 2016-11-22 NOTE — Telephone Encounter (Signed)
Office note from Dr Forde Dandy for surgical clearance faxed to [facility] pre-anesthesia testing.  Encounter closed.

## 2016-11-22 NOTE — Telephone Encounter (Signed)
Per previous call with patient. Stated Dr Forde Dandy cleared her for surgery and completed HgbA1C. No office notes received.  Patient could not get in to see Dr gates until week before surgery. Call to Dr Baldwin Crown office. Notes with HgbA1C to be faxed to office.  Will send to your office for review.

## 2016-11-22 NOTE — Telephone Encounter (Signed)
Office notes received.  HgbA1C is 6.5.  Now on prednisone 5 mg daily.

## 2016-12-11 NOTE — Patient Instructions (Addendum)
Your procedure is scheduled on: Tuesday December 19, 2016   Enter through the Main Entrance of [facility] at: 6:00 am  Pick up the phone at the desk and dial [phone].  Call this number if you have problems the morning of surgery: [phone].  Remember: Do NOT eat food or drink any liquids after: Midnight Monday August 20 : Take these medicines the morning of surgery with a SIP OF WATER:   Hydrochlorothiazide, Hydroxyzine, Topiramate, Prednisone, Zantac, Ketotifen  Do NOT take evening dose of Metformin Monday night  Do NOT wear jewelry (body piercing), metal hair clips/bobby pins, make-up, or nail polish. Do NOT wear lotions, powders, or perfumes.  You may wear deoderant. Do NOT shave for 48 hours prior to surgery. Do NOT bring valuables to the hospital. Contacts, dentures, or bridgework may not be worn into surgery.  Leave suitcase in car.  After surgery it may be brought to your room.  For patients admitted to the hospital, checkout time is 11:00 AM the day of discharge.  Have a responsible adult drive you home and stay with you for 24 hours after your procedure

## 2016-12-12 ENCOUNTER — Inpatient Hospital Stay (HOSPITAL_COMMUNITY): Admission: RE | Admit: 2016-12-12 | Payer: Self-pay | Source: Ambulatory Visit

## 2016-12-12 ENCOUNTER — Encounter (HOSPITAL_COMMUNITY)
Admission: RE | Admit: 2016-12-12 | Discharge: 2016-12-12 | Disposition: A | Discharge status: Home / Self Care | Payer: Medicare Other | Source: Ambulatory Visit | Attending: Obstetrics and Gynecology | Admitting: Obstetrics and Gynecology

## 2016-12-12 ENCOUNTER — Encounter (HOSPITAL_COMMUNITY): Payer: Self-pay

## 2016-12-12 ENCOUNTER — Other Ambulatory Visit: Payer: Self-pay

## 2016-12-12 DIAGNOSIS — M1990 Unspecified osteoarthritis, unspecified site: Secondary | ICD-10-CM | POA: Diagnosis not present

## 2016-12-12 DIAGNOSIS — E039 Hypothyroidism, unspecified: Secondary | ICD-10-CM | POA: Insufficient documentation

## 2016-12-12 DIAGNOSIS — Z01812 Encounter for preprocedural laboratory examination: Secondary | ICD-10-CM | POA: Insufficient documentation

## 2016-12-12 DIAGNOSIS — I444 Left anterior fascicular block: Secondary | ICD-10-CM | POA: Diagnosis not present

## 2016-12-12 DIAGNOSIS — D649 Anemia, unspecified: Secondary | ICD-10-CM | POA: Insufficient documentation

## 2016-12-12 DIAGNOSIS — Z01810 Encounter for preprocedural cardiovascular examination: Secondary | ICD-10-CM | POA: Diagnosis not present

## 2016-12-12 DIAGNOSIS — E119 Type 2 diabetes mellitus without complications: Secondary | ICD-10-CM | POA: Diagnosis not present

## 2016-12-12 DIAGNOSIS — Z6828 Body mass index (BMI) 28.0-28.9, adult: Secondary | ICD-10-CM | POA: Diagnosis not present

## 2016-12-12 DIAGNOSIS — E1129 Type 2 diabetes mellitus with other diabetic kidney complication: Secondary | ICD-10-CM | POA: Diagnosis not present

## 2016-12-12 DIAGNOSIS — E2749 Other adrenocortical insufficiency: Secondary | ICD-10-CM | POA: Diagnosis not present

## 2016-12-12 DIAGNOSIS — I10 Essential (primary) hypertension: Secondary | ICD-10-CM | POA: Diagnosis not present

## 2016-12-12 DIAGNOSIS — Z4681 Encounter for fitting and adjustment of insulin pump: Secondary | ICD-10-CM | POA: Diagnosis not present

## 2016-12-12 DIAGNOSIS — N183 Chronic kidney disease, stage 3 (moderate): Secondary | ICD-10-CM | POA: Diagnosis not present

## 2016-12-12 LAB — CBC
HEMATOCRIT: 33.4 % — AB (ref 36.0–46.0)
Hemoglobin: 10.7 g/dL — ABNORMAL LOW (ref 12.0–15.0)
MCH: 29 pg (ref 26.0–34.0)
MCHC: 32 g/dL (ref 30.0–36.0)
MCV: 90.5 fL (ref 78.0–100.0)
Platelets: 383 10*3/uL (ref 150–400)
RBC: 3.69 MIL/uL — ABNORMAL LOW (ref 3.87–5.11)
RDW: 14.3 % (ref 11.5–15.5)
WBC: 9.2 10*3/uL (ref 4.0–10.5)

## 2016-12-12 LAB — BASIC METABOLIC PANEL
ANION GAP: 9 (ref 5–15)
BUN: 24 mg/dL — AB (ref 6–20)
CO2: 29 mmol/L (ref 22–32)
Calcium: 9.3 mg/dL (ref 8.9–10.3)
Chloride: 101 mmol/L (ref 101–111)
Creatinine, Ser: 1.6 mg/dL — ABNORMAL HIGH (ref 0.44–1.00)
GFR calc non Af Amer: 33 mL/min — ABNORMAL LOW (ref >60)
GFR, EST AFRICAN AMERICAN: 38 mL/min — AB (ref >60)
Glucose, Bld: 166 mg/dL — ABNORMAL HIGH (ref 65–99)
POTASSIUM: 3.3 mmol/L — AB (ref 3.5–5.1)
SODIUM: 139 mmol/L (ref 135–145)

## 2016-12-12 NOTE — Pre-Procedure Instructions (Signed)
Discussed diabetic insulin pump management with Gabrielle Beach RN. Gabrielle Lynch will get in contact with Gabrielle Lynch for instructions on managing the pump perioperatively.  Gabrielle Lynch saw Gabrielle Lynch for anesthesia consult.

## 2016-12-12 NOTE — Anesthesia Preprocedure Evaluation (Addendum)
Anesthesia Evaluation  Patient identified by MRN, date of birth, ID band Patient awake    History of Anesthesia Complications (+) PONV and history of anesthetic complications  Airway Mallampati: II  TM Distance: >3 FB Neck ROM: Full    Dental no notable dental hx. (+) Dental Advisory Given   Pulmonary    Pulmonary exam normal        Cardiovascular hypertension, Normal cardiovascular exam     Neuro/Psych  Headaches,    GI/Hepatic negative GI ROS, Neg liver ROS,   Endo/Other  diabetesHypothyroidism   Renal/GU negative Renal ROS     Musculoskeletal  (+) Arthritis , Rheumatoid disorders,    Abdominal   Peds  Hematology  (+) anemia ,   Anesthesia Other Findings PT STATES SHE HAS "INAPPROPIATE MAST CELL ACTIVATION SYNDROME" --WILL USUALLY HAVE N&V AND THEN NO BLOOD PRESSURE.  STATES SHE CARRIES EPI PEN--BUT HAS NOT HAD ANY RECENT EPISODES.  STATES MANY OF THE MEDICATIONS SHE TAKES ARE TO HELP THIS PROBLEM - INCLUDING  Ketotifen-mast cell inhibitor-not approved in [place].   Reproductive/Obstetrics                           Anesthesia Physical Anesthesia Plan  ASA: III  Anesthesia Plan: General   Post-op Pain Management:    Induction: Intravenous  PONV Risk Score and Plan: 4 or greater and Ondansetron, Scopolamine patch - Pre-op and Diphenhydramine  Airway Management Planned: Oral ETT  Additional Equipment:   Intra-op Plan:   Post-operative Plan: Extubation in OR  Informed Consent: I have reviewed the patients History and Physical, chart, labs and discussed the procedure including the risks, benefits and alternatives for the proposed anesthesia with the patient or authorized representative who has indicated his/her understanding and acceptance.   Dental advisory given  Plan Discussed with: CRNA, Anesthesiologist and Surgeon  Anesthesia Plan Comments: (Stress dose steroids: 100mg  Solucortef  iv)       Anesthesia Quick Evaluation

## 2016-12-13 ENCOUNTER — Encounter: Payer: Self-pay | Admitting: Obstetrics and Gynecology

## 2016-12-13 ENCOUNTER — Ambulatory Visit (INDEPENDENT_AMBULATORY_CARE_PROVIDER_SITE_OTHER): Payer: Medicare Other | Admitting: Obstetrics and Gynecology

## 2016-12-13 VITALS — BP 120/74 | HR 66 | Wt 169.6 lb

## 2016-12-13 DIAGNOSIS — M353 Polymyalgia rheumatica: Secondary | ICD-10-CM | POA: Diagnosis not present

## 2016-12-13 DIAGNOSIS — E559 Vitamin D deficiency, unspecified: Secondary | ICD-10-CM | POA: Diagnosis not present

## 2016-12-13 DIAGNOSIS — N816 Rectocele: Secondary | ICD-10-CM

## 2016-12-13 DIAGNOSIS — E039 Hypothyroidism, unspecified: Secondary | ICD-10-CM | POA: Diagnosis not present

## 2016-12-13 DIAGNOSIS — K589 Irritable bowel syndrome without diarrhea: Secondary | ICD-10-CM | POA: Diagnosis not present

## 2016-12-13 DIAGNOSIS — E119 Type 2 diabetes mellitus without complications: Secondary | ICD-10-CM | POA: Diagnosis not present

## 2016-12-13 DIAGNOSIS — N951 Menopausal and female climacteric states: Secondary | ICD-10-CM

## 2016-12-13 DIAGNOSIS — R6882 Decreased libido: Secondary | ICD-10-CM | POA: Diagnosis not present

## 2016-12-13 DIAGNOSIS — I10 Essential (primary) hypertension: Secondary | ICD-10-CM | POA: Diagnosis not present

## 2016-12-13 DIAGNOSIS — E538 Deficiency of other specified B group vitamins: Secondary | ICD-10-CM | POA: Diagnosis not present

## 2016-12-13 MED ORDER — GABAPENTIN 100 MG PO CAPS: ORAL_CAPSULE | ORAL | 5 refills | Status: DC

## 2016-12-13 NOTE — Patient Instructions (Signed)
Clonidine skin patches  What is this medicine? CLONIDINE (KLOE ni deen) is used to treat high blood pressure. This medicine may be used for other purposes; ask your health care provider or pharmacist if you have questions. COMMON BRAND NAME(S): Catapres-TTS What should I tell my health care provider before I take this medicine? They need to know if you have any of these conditions: -kidney disease -an unusual or allergic reaction to clonidine, other medicines, foods, dyes, or preservatives -pregnant or trying to get pregnant -breast-feeding How should I use this medicine? This medicine is for external use only. Follow the directions on the prescription label. Apply the patch to an area of the upper arm or part of the body that is clean, dry and hairless. Avoid injured, irritated, calloused, or scarred areas. Use a different site each time to prevent skin irritation. Do not cut or trim the patch. One patch should last for 7 days. Do not use your medicine more often than directed. Do not stop using except on the advice of your doctor or health care professional. You must gradually reduce the dose or you may get a dangerous increase in blood pressure. Talk to your pediatrician regarding the use of this medicine in children. Special care may be needed. Overdosage: If you think you have taken too much of this medicine contact a poison control center or emergency room at once. NOTE: This medicine is only for you. Do not share this medicine with others. What if I miss a dose? Replace each patch on the same day of each week, or if the patch falls off. If you do forget to change the patch for two or three days, check with your doctor or health care professional. What may interact with this medicine? Do not take this medicine with any of the following medications: -MAOIs like Carbex, Eldepryl, Marplan, Nardil, and Parnate This medicine may also interact with the following medications: -barbiturate  medicines for inducing sleep or treating seizures like phenobarbital -certain medicines for blood pressure, heart disease, irregular heart beat -certain medicines for depression, anxiety, or psychotic disturbances -prescription pain medicines This list may not describe all possible interactions. Give your health care provider a list of all the medicines, herbs, non-prescription drugs, or dietary supplements you use. Also tell them if you smoke, drink alcohol, or use illegal drugs. Some items may interact with your medicine. What should I watch for while using this medicine? Visit your doctor or health care professional for regular checks on your progress. Check your heart rate and blood pressure regularly while you are using this medicine. Ask your doctor or health care professional what your heart rate should be and when you should contact him or her. You can shower or bathe with the skin patch in position. If the patch gets loose, cover it with the extra adhesive overlay provided. You may get drowsy or dizzy. Do not drive, use machinery, or do anything that needs mental alertness until you know how this medicine affects you. To avoid dizzy or fainting spells, do not stand or sit up quickly, especially if you are an older person. Alcohol can make you more drowsy and dizzy. Avoid alcoholic drinks. Your mouth may get dry. Chewing sugarless gum or sucking hard candy, and drinking plenty of water will help. Do not treat yourself for coughs, colds, or pain while you are using this medicine without asking your doctor or health care professional for advice. Some ingredients may increase your blood pressure. If you are   going to have surgery tell your doctor or health care professional that you are using this medicine. If you are going to have a magnetic resonance imaging (MRI) procedure, tell your MRI technician if you have this patch on your body. It must be removed before a MRI. What side effects may I  notice from receiving this medicine? Side effects that you should report to your doctor or health care professional as soon as possible: -allergic reactions like skin rash, itching or hives, swelling of the face, lips, or tongue -anxiety, nervousness -chest pain -depression -fast, irregular heartbeat -swelling of feet or legs -unusually weak or tired Side effects that usually do not require medical attention (report to your doctor or health care professional if they continue or are bothersome): -change in sex drive or performance -constipation -headache -skin redness, irritation, or darkening under the patch area This list may not describe all possible side effects. Call your doctor for medical advice about side effects. You may report side effects to FDA at 1-800-FDA-1088. Where should I keep my medicine? Keep out of the reach of children. Store at room temperature between 15 and 30 degrees C (59 to 86 degrees F). Throw away any unused medicine after the expiration date. NOTE: This sheet is a summary. It may not cover all possible information. If you have questions about this medicine, talk to your doctor, pharmacist, or health care provider.  2018 Elsevier/Gold Standard (2014-12-28 20:58:28)  

## 2016-12-13 NOTE — Progress Notes (Signed)
GYNECOLOGY  VISIT   HPI: 66 y.o.   Married  Caucasian  female   G2P2 with No LMP recorded. Patient has had a hysterectomy.   here for surgical consult.   Desires rectocele repair. Had preop clearance from her PCP.   Started to have some fecal incontinence recently.  On Effexor 75 mg and Gabapentin 200 mg at hs for hot flashes.  Having terrible hot flashes during the day only.   Struggling with decreased libido.  Wants to travel to [place] on 01/22/17.  We discussed risk of DVT/PE at this point.  GYNECOLOGIC HISTORY: No LMP recorded. Patient has had a hysterectomy. Contraception:  Hysterectomy Menopausal hormone therapy:  Vagifem Last mammogram: 12/02/15 [facility] Last pap smear: 09-07-16 Neg; 09-30-15 Neg        OB History    Gravida Para Term Preterm AB Living   2 2       2    SAB TAB Ectopic Multiple Live Births                     Patient Active Problem List   Diagnosis Date Noted  . Postmenopausal HRT (hormone replacement therapy) 10/29/2014  . Hot flashes, menopausal 10/29/2014  . Fitting and adjustment of gastric lap band 01/14/2013  . Obese 01/14/2013  . H/O vitamin D deficiency 08/09/2012  . Unspecified hypothyroidism 08/09/2012  . Menopause 08/09/2012  . Postoperative seroma-in ventral hernia repair site 07/20/2011  . Lapband APS April 2010 07/20/2011  . Hypertension   . Mast cell disorder   . DEGENERATIVE JOINT DISEASE 02/19/2008  . SPONDYLOSIS UNSPEC SITE W/O MENTION MYELOPATHY 02/19/2008  . SINUS TARSI SYNDROME 02/19/2008  . UNEQUAL LEG LENGTH 02/19/2008    Past Medical History:  Diagnosis Date  . Abnormal Pap smear of cervix    Hx of cryotherapy to cervix in her 49s  . Abnormal uterine bleeding    history of fibroids  . Anemia   . Diabetes mellitus without complication ([place])   . DJD (degenerative joint disease)   . Fibroid   . Heart murmur    functional  . History of hypertension NO MEDS SINCE LAP GASTRIC BAND 2010 --  WT.  LOSS  . Hypertension   . Hypothyroidism   . 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  STATES SHE CARRIES EPI PEN--BUT HAS NOT HAD ANY RECENT EPISODES.  STATES MANY OF THE MEDICATIONS SHE TAKES ARE TO HELP THIS PROBLEM - INCLUDING  Ketotifen-mast cell inhibitor-not approved in [place].   . Migraine   . 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 Osteopenia   . PMR (polymyalgia rheumatica) ([facility])   . PONV (postoperative nausea and vomiting)   . Spondylosis     Past Surgical History:  Procedure Laterality Date  . ABDOMINAL HYSTERECTOMY  02/20/2011   Procedure: HYSTERECTOMY ABDOMINAL;  Surgeon: Bennetta Laos, MD;  Location: [facility];  Service: Gynecology;  Laterality: N/A;  . CERVICAL BIOPSY  W/ LOOP ELECTRODE EXCISION  1992  . CERVIX LESION DESTRUCTION    . [place]   X2  . CHOLECYSTECTOMY    . COLONOSCOPY    . DILATION AND CURETTAGE OF UTERUS     heavy menses  . HYSTEROSCOPY  06/1995   HYSTEROSCOPIC MYOMECTOMY  . HYSTEROSCOPY  06/1998   D&C, HYSTEROSCOPY  . KNEE SURGERY  X2 right knee  . LAPAROSCOPIC GASTRIC BANDING  08-03-2008   [facility] APS SYSTEM  . LESION REMOVAL N/A 09/02/2012   Procedure: EXCISION VAGINAL LESION;  Surgeon: Terrance Mass, MD;  Location: [facility];  Service: Gynecology;  Laterality: N/A;  cpt 57100  one hour  . PUBOVAGINAL SLING  02/20/2011   Procedure: Gaynelle Arabian;  Surgeon: Ailene Rud, MD;  Location: [facility];  Service: Urology;  Laterality: N/A;  . SALPINGOOPHORECTOMY  02/20/2011   Procedure: SALPINGO OOPHERECTOMY;  Surgeon: Bennetta Laos, MD;  Location: [facility];  Service: Gynecology;  Laterality: Bilateral;  . UPPER GASTROINTESTINAL ENDOSCOPY  06/2011  . VENTRAL HERNIA REPAIR  05/24/2011   Procedure: LAPAROSCOPIC VENTRAL HERNIA;  Surgeon: Pedro Earls, MD;  Location: [facility];   Service: General;  Laterality: N/A;    Current Outpatient Prescriptions  Medication Sig Dispense Refill  . cetirizine (ZYRTEC) 10 MG tablet Take 10 mg by mouth daily.     . clonazePAM (KLONOPIN) 1 MG tablet Take 1 mg by mouth at bedtime as needed for anxiety.    Marland Kitchen EPINEPHrine (EPI-PEN) 0.3 mg/0.3 mL DEVI Inject into the muscle as needed.    . Estradiol (VAGIFEM) 10 MCG TABS vaginal tablet Use every night before bed for two weeks when you first begin this medicine, then after the first two weeks, begin using it twice a week. 18 tablet 12  . fenofibrate 160 MG tablet Take 160 mg by mouth daily.    . finasteride (PROSCAR) 5 MG tablet Take 2.5 mg by mouth daily.     Marland Kitchen gabapentin (NEURONTIN) 100 MG capsule Take 2 capsules (200 mg) po bid. 120 capsule 5  . hydrochlorothiazide (HYDRODIURIL) 25 MG tablet Take 25 mg by mouth daily.    . hydrOXYzine (ATARAX/VISTARIL) 50 MG tablet Take 1 tablet by mouth 2 (two) times daily.    . hyoscyamine (LEVSIN SL) 0.125 MG SL tablet Place 1 tablet under the tongue as needed.    . insulin aspart (NOVOLOG) cartridge Inject into the skin 3 (three) times daily with meals. Patient has insulin pump    . levothyroxine (SYNTHROID, LEVOTHROID) 137 MCG tablet Take 137 mcg by mouth daily before breakfast.    . Linagliptin-Metformin HCl (JENTADUETO) 2.5-850 MG TABS Take 1 tablet by mouth 2 (two) times daily.    . montelukast (SINGULAIR) 10 MG tablet Take 10 mg by mouth at bedtime.     . ondansetron (ZOFRAN-ODT) 4 MG disintegrating tablet Take 1 tablet by mouth as needed.    . predniSONE (DELTASONE) 5 MG tablet Take 5 mg by mouth daily with breakfast.    . PRESCRIPTION MEDICATION Take 1 tablet by mouth 2 (two) times daily. Ketotifen 5mg  tablet[place]. For mast cell problem    . ranitidine (ZANTAC) 300 MG tablet Take 300 mg by mouth daily.    Marland Kitchen topiramate (TOPAMAX) 50 MG tablet Take 50 mg by mouth 2 (two) times daily.     Marland Kitchen venlafaxine XR (EFFEXOR-XR) 75  MG 24 hr capsule Take 75 mg by mouth daily.    . Vitamin D, Ergocalciferol, (DRISDOL) 50000 units CAPS capsule Take 50,000 Units by mouth every Saturday.     . zolmitriptan (ZOMIG) 5 MG tablet Take 5 mg by mouth as needed for migraine.     No current facility-administered medications for this visit.      ALLERGIES: Meperidine and Demerol  Family History  Problem Relation Age of Onset  . Breast cancer  Mother        11's  . Hypertension Mother   . Thyroid disease Mother        hypothyroid  . Cancer Father        LUNG  . Thyroid disease Maternal Grandmother        hypothyroid    Social History   Social History  . Marital status: Married    Spouse name: N/A  . Number of children: N/A  . Years of education: N/A   Occupational History  . Not on file.   Social History Main Topics  . Smoking status: Never Smoker  . Smokeless tobacco: Never Used  . Alcohol use No     Comment: social  . Drug use: No  . Sexual activity: Yes    Partners: Male    Birth control/ protection: Surgical     Comment: TAH/BSO   Other Topics Concern  . Not on file   Social History Narrative  . No narrative on file    ROS:  Pertinent items are noted in HPI.  PHYSICAL EXAMINATION:    There were no vitals taken for this visit.    General appearance: alert, cooperative and appears stated age Head: Normocephalic, without obvious abnormality, atraumatic Neck: no adenopathy, supple, symmetrical, trachea midline and thyroid normal to inspection and palpation Lungs: clear to auscultation bilaterally Heart: regular rate and rhythm Abdomen: lap band port palpable in right upper abdomen, soft, non-tender, no masses, no organomegaly Extremities: extremities normal, atraumatic, no cyanosis or edema Skin: Skin color, texture, turgor normal. No rashes or lesions Lymph nodes: Cervical, supraclavicular, and axillary nodes normal. No abnormal inguinal nodes palpated Neurologic: Grossly normal  Pelvic:  External genitalia:  no lesions              Urethra:  normal appearing urethra with no masses, tenderness or lesions              Bartholins and Skenes: normal                 Vagina: normal appearing vagina with normal color and discharge, no lesions.  Second degree rectocele.              Cervix: absent.                Bimanual Exam:  Uterus: absent.               Adnexa: no mass, fullness, tenderness              Rectal exam: Yes.  .  Confirms.              Anus:  normal sphincter tone, no lesions  Chaperone was present for exam.  ASSESSMENT  Status post TAH/BSO. Rectocele.  Menopausal symptoms.  Decreased libido. Steroid use. HTN.  DM.  PLAN  Discussed rectocele native tissue repair.  Risks, benefits, and alternatives reviewed.  Risks include but are not limited to bleeding, infection, damage to surrounding organs, reactions to anesthesia, DVT, PE, death,  dyspareunia, and need for operation. Surgical expectations and recovery discussed. Patient wishes to proceed.  All preop labs and EKG reviewed in detail with the patient.  Please refer to EPIC. We talked about the Effexor and likely effect on reducing libido. She will eventually wean off of this.  Increase Gabapentin to 200 mg po bid x 5 months.  Will check with PCP regarding Clonidine patch.  She knows that some of her hot flashes may be due  to other etiologies and not just menopause. May need stress dosing of steroids on the day of her procedure.   An After Visit Summary was printed and given to the patient.  __45___ minutes face to face time of which over 50% was spent in counseling.

## 2016-12-18 DIAGNOSIS — N183 Chronic kidney disease, stage 3 (moderate): Secondary | ICD-10-CM | POA: Diagnosis not present

## 2016-12-18 DIAGNOSIS — E538 Deficiency of other specified B group vitamins: Secondary | ICD-10-CM | POA: Diagnosis not present

## 2016-12-18 DIAGNOSIS — R7989 Other specified abnormal findings of blood chemistry: Secondary | ICD-10-CM | POA: Diagnosis not present

## 2016-12-18 DIAGNOSIS — R768 Other specified abnormal immunological findings in serum: Secondary | ICD-10-CM | POA: Diagnosis not present

## 2016-12-18 DIAGNOSIS — D649 Anemia, unspecified: Secondary | ICD-10-CM | POA: Diagnosis not present

## 2016-12-18 DIAGNOSIS — D631 Anemia in chronic kidney disease: Secondary | ICD-10-CM | POA: Diagnosis not present

## 2016-12-19 ENCOUNTER — Ambulatory Visit (HOSPITAL_COMMUNITY): Payer: Medicare Other | Admitting: Anesthesiology

## 2016-12-19 ENCOUNTER — Ambulatory Visit (HOSPITAL_COMMUNITY)
Admission: RE | Admit: 2016-12-19 | Discharge: 2016-12-19 | Disposition: A | Discharge status: Home / Self Care | Payer: Medicare Other | Source: Ambulatory Visit | Attending: Obstetrics and Gynecology | Admitting: Obstetrics and Gynecology

## 2016-12-19 ENCOUNTER — Encounter (HOSPITAL_COMMUNITY): Payer: Self-pay | Admitting: *Deleted

## 2016-12-19 ENCOUNTER — Encounter (HOSPITAL_COMMUNITY)
Admission: RE | Disposition: A | Discharge status: Home / Self Care | Payer: Self-pay | Source: Ambulatory Visit | Attending: Obstetrics and Gynecology

## 2016-12-19 DIAGNOSIS — Z79890 Hormone replacement therapy (postmenopausal): Secondary | ICD-10-CM | POA: Insufficient documentation

## 2016-12-19 DIAGNOSIS — D4701 Cutaneous mastocytosis: Secondary | ICD-10-CM | POA: Diagnosis not present

## 2016-12-19 DIAGNOSIS — Z90710 Acquired absence of both cervix and uterus: Secondary | ICD-10-CM | POA: Diagnosis not present

## 2016-12-19 DIAGNOSIS — R6882 Decreased libido: Secondary | ICD-10-CM | POA: Diagnosis not present

## 2016-12-19 DIAGNOSIS — Z794 Long term (current) use of insulin: Secondary | ICD-10-CM | POA: Insufficient documentation

## 2016-12-19 DIAGNOSIS — M353 Polymyalgia rheumatica: Secondary | ICD-10-CM | POA: Insufficient documentation

## 2016-12-19 DIAGNOSIS — Z79899 Other long term (current) drug therapy: Secondary | ICD-10-CM | POA: Insufficient documentation

## 2016-12-19 DIAGNOSIS — Z888 Allergy status to other drugs, medicaments and biological substances status: Secondary | ICD-10-CM | POA: Insufficient documentation

## 2016-12-19 DIAGNOSIS — E119 Type 2 diabetes mellitus without complications: Secondary | ICD-10-CM | POA: Diagnosis not present

## 2016-12-19 DIAGNOSIS — Z885 Allergy status to narcotic agent status: Secondary | ICD-10-CM | POA: Diagnosis not present

## 2016-12-19 DIAGNOSIS — E039 Hypothyroidism, unspecified: Secondary | ICD-10-CM | POA: Diagnosis not present

## 2016-12-19 DIAGNOSIS — R159 Full incontinence of feces: Secondary | ICD-10-CM | POA: Insufficient documentation

## 2016-12-19 DIAGNOSIS — M8580 Other specified disorders of bone density and structure, unspecified site: Secondary | ICD-10-CM | POA: Diagnosis not present

## 2016-12-19 DIAGNOSIS — N816 Rectocele: Secondary | ICD-10-CM | POA: Diagnosis not present

## 2016-12-19 DIAGNOSIS — N951 Menopausal and female climacteric states: Secondary | ICD-10-CM | POA: Insufficient documentation

## 2016-12-19 DIAGNOSIS — M1990 Unspecified osteoarthritis, unspecified site: Secondary | ICD-10-CM | POA: Insufficient documentation

## 2016-12-19 DIAGNOSIS — I10 Essential (primary) hypertension: Secondary | ICD-10-CM | POA: Diagnosis not present

## 2016-12-19 HISTORY — PX: RECTOCELE REPAIR: SHX761

## 2016-12-19 LAB — GLUCOSE, CAPILLARY
GLUCOSE-CAPILLARY: 101 mg/dL — AB (ref 65–99)
GLUCOSE-CAPILLARY: 149 mg/dL — AB (ref 65–99)

## 2016-12-19 SURGERY — COLPORRHAPHY, POSTERIOR, FOR RECTOCELE REPAIR
Anesthesia: General | Site: Vagina

## 2016-12-19 MED ORDER — LIDOCAINE-EPINEPHRINE 1 %-1:100000 IJ SOLN
INTRAMUSCULAR | Status: AC
  Filled 2016-12-19: qty 1

## 2016-12-19 MED ORDER — CEFOTETAN DISODIUM-DEXTROSE 2-2.08 GM-% IV SOLR
INTRAVENOUS | Status: AC
  Filled 2016-12-19: qty 50

## 2016-12-19 MED ORDER — CEFOTETAN DISODIUM-DEXTROSE 2-2.08 GM-% IV SOLR
2.0000 g | INTRAVENOUS | Status: AC
  Administered 2016-12-19: 2 g via INTRAVENOUS

## 2016-12-19 MED ORDER — LIDOCAINE-EPINEPHRINE 1 %-1:100000 IJ SOLN
INTRAMUSCULAR | Status: DC | PRN
  Administered 2016-12-19: 5 mL

## 2016-12-19 MED ORDER — SCOPOLAMINE 1 MG/3DAYS TD PT72
MEDICATED_PATCH | TRANSDERMAL | Status: AC
  Filled 2016-12-19: qty 1

## 2016-12-19 MED ORDER — ONDANSETRON HCL 4 MG/2ML IJ SOLN
INTRAMUSCULAR | Status: DC | PRN
  Administered 2016-12-19: 4 mg via INTRAVENOUS

## 2016-12-19 MED ORDER — FENTANYL CITRATE (PF) 100 MCG/2ML IJ SOLN
INTRAMUSCULAR | Status: AC
  Filled 2016-12-19: qty 2

## 2016-12-19 MED ORDER — SCOPOLAMINE 1 MG/3DAYS TD PT72
1.0000 | MEDICATED_PATCH | TRANSDERMAL | Status: DC
  Administered 2016-12-19: 1.5 mg via TRANSDERMAL

## 2016-12-19 MED ORDER — SODIUM CHLORIDE 0.9 % IJ SOLN
INTRAMUSCULAR | Status: AC
  Filled 2016-12-19: qty 10

## 2016-12-19 MED ORDER — IBUPROFEN 800 MG PO TABS: 800.0000 mg | ORAL_TABLET | Freq: Three times a day (TID) | ORAL | 0 refills | Status: DC | PRN

## 2016-12-19 MED ORDER — FENTANYL CITRATE (PF) 100 MCG/2ML IJ SOLN
INTRAMUSCULAR | Status: DC | PRN
  Administered 2016-12-19: 100 ug via INTRAVENOUS
  Administered 2016-12-19: 50 ug via INTRAVENOUS
  Administered 2016-12-19: 100 ug via INTRAVENOUS

## 2016-12-19 MED ORDER — ROCURONIUM BROMIDE 100 MG/10ML IV SOLN
INTRAVENOUS | Status: DC | PRN
  Administered 2016-12-19: 40 mg via INTRAVENOUS
  Administered 2016-12-19: 20 mg via INTRAVENOUS

## 2016-12-19 MED ORDER — GLYCOPYRROLATE 0.2 MG/ML IJ SOLN
INTRAMUSCULAR | Status: DC | PRN
  Administered 2016-12-19: 0.6 mg via INTRAVENOUS

## 2016-12-19 MED ORDER — METHYLPREDNISOLONE SODIUM SUCC 125 MG IJ SOLR
INTRAMUSCULAR | Status: DC | PRN
  Administered 2016-12-19 (×2): 50 mg via INTRAVENOUS

## 2016-12-19 MED ORDER — HYDROMORPHONE HCL 1 MG/ML IJ SOLN: 0.2500 mg | INTRAMUSCULAR | Status: DC | PRN

## 2016-12-19 MED ORDER — MIDAZOLAM HCL 2 MG/2ML IJ SOLN
INTRAMUSCULAR | Status: DC | PRN
  Administered 2016-12-19: 2 mg via INTRAVENOUS

## 2016-12-19 MED ORDER — NEOSTIGMINE METHYLSULFATE 10 MG/10ML IV SOLN
INTRAVENOUS | Status: DC | PRN
  Administered 2016-12-19: 4 mg via INTRAVENOUS

## 2016-12-19 MED ORDER — OXYCODONE-ACETAMINOPHEN 5-325 MG PO TABS: 2.0000 | ORAL_TABLET | ORAL | 0 refills | Status: DC | PRN

## 2016-12-19 MED ORDER — METHYLPREDNISOLONE SODIUM SUCC 125 MG IJ SOLR
INTRAMUSCULAR | Status: AC
  Filled 2016-12-19: qty 2

## 2016-12-19 MED ORDER — LACTATED RINGERS IV SOLN
INTRAVENOUS | Status: DC
  Administered 2016-12-19: 08:00:00 via INTRAVENOUS

## 2016-12-19 MED ORDER — ESTRADIOL 0.1 MG/GM VA CREA
TOPICAL_CREAM | VAGINAL | Status: AC
  Filled 2016-12-19: qty 42.5

## 2016-12-19 MED ORDER — PROMETHAZINE HCL 25 MG/ML IJ SOLN: 6.2500 mg | INTRAMUSCULAR | Status: DC | PRN

## 2016-12-19 MED ORDER — MIDAZOLAM HCL 2 MG/2ML IJ SOLN
INTRAMUSCULAR | Status: AC
  Filled 2016-12-19: qty 2

## 2016-12-19 MED ORDER — EPINEPHRINE PF 1 MG/10ML IJ SOSY
PREFILLED_SYRINGE | INTRAMUSCULAR | Status: AC
  Filled 2016-12-19: qty 10

## 2016-12-19 SURGICAL SUPPLY — 19 items
CANISTER SUCT 3000ML PPV (MISCELLANEOUS) ×2 IMPLANT
CLOTH BEACON ORANGE TIMEOUT ST (SAFETY) ×2 IMPLANT
DECANTER SPIKE VIAL GLASS SM (MISCELLANEOUS) ×2 IMPLANT
GAUZE PACKING 2X5 YD STRL (GAUZE/BANDAGES/DRESSINGS) ×2 IMPLANT
GLOVE BIO SURGEON STRL SZ 6.5 (GLOVE) ×2 IMPLANT
GLOVE BIOGEL PI IND STRL 7.0 (GLOVE) ×1 IMPLANT
GLOVE BIOGEL PI INDICATOR 7.0 (GLOVE) ×1
GOWN STRL REUS W/TWL LRG LVL3 (GOWN DISPOSABLE) ×8 IMPLANT
NEEDLE HYPO 22GX1.5 SAFETY (NEEDLE) ×2 IMPLANT
NS IRRIG 1000ML POUR BTL (IV SOLUTION) ×2 IMPLANT
PACK VAGINAL WOMENS (CUSTOM PROCEDURE TRAY) ×2 IMPLANT
SUT VIC AB 0 CT1 27 (SUTURE) ×6
SUT VIC AB 0 CT1 27XBRD ANBCTR (SUTURE) ×3 IMPLANT
SUT VIC AB 2-0 CT1 27 (SUTURE)
SUT VIC AB 2-0 CT1 TAPERPNT 27 (SUTURE) IMPLANT
SUT VIC AB 2-0 SH 27 (SUTURE) ×4
SUT VIC AB 2-0 SH 27XBRD (SUTURE) ×4 IMPLANT
TOWEL OR 17X24 6PK STRL BLUE (TOWEL DISPOSABLE) ×4 IMPLANT
TRAY FOLEY CATH SILVER 14FR (SET/KITS/TRAYS/PACK) ×2 IMPLANT

## 2016-12-19 NOTE — Discharge Instructions (Signed)
Gabrielle Lynch,   Your surgery went well today! Your rectocele is repaired.  Thank you,   Josefa Half, MD   Posterior Colporrhaphy, Care After Refer to this sheet in the next few weeks. These instructions provide you with information on caring for yourself after your procedure. Your health care provider may also give you more specific instructions. Your treatment has been planned according to current medical practices, but problems sometimes occur. Call your health care provider if you have any problems or questions after your procedure. Follow these instructions at home:  Take frequent rest periods throughout the day.  Only take over-the-counter or prescription medicines as directed by your health care provider.  Avoid strenuous activity such as heavy lifting (more than 10 pounds [4.5 kg]), pushing, and pulling until your health care provider says it is okay.  Take showers if your health care provider approves. Pat incisions dry. Do not rub incisions with a washcloth or towel. Do not take tub baths until your health care provider approves.  Wear compression stockings as directed by your health care provider. These stockings help prevent blood clots from forming in your legs.  Talk with your health care provider about when you may return to work and your exercise routine.  Do not drive until your health care provider approves.  You may resume your normal diet. Eat a well-balanced diet.  Drink enough fluids to keep your urine clear or pale yellow.  Your normal bowel function should return. If you become constipated, you may: ? Take a mild laxative. ? Add fruit and bran to your diet. ? Drink more fluids.  Do not have sexual intercourse until permitted by your health care provider.  Follow up with your health care provider as directed. Contact a health care provider if: You have persistent nausea or vomiting. Get help right away if:  You have increased bleeding (more than a  small spot) from the vaginal area.  Your pain is not relieved with medicine or becomes worse.  You have redness, swelling, or increasing pain in the vaginal area.  You have abdominal pain.  You see pus coming from the wounds.  You develop a fever.  You have a foul smell coming from your vaginal area.  You develop light-headedness or you feel faint.  You have difficulty breathing. This information is not intended to replace advice given to you by your health care provider. Make sure you discuss any questions you have with your health care provider. Document Released: 11/03/2004 Document Revised: 09/23/2015 Document Reviewed: 09/06/2012 Elsevier Interactive Patient Education  2017 [place] [facility] Care Instructions  Activity: Get plenty of rest for the remainder of the day. A responsible individual must stay with you for 24 hours following the procedure.  For the next 24 hours, DO NOT: -Drive a car -Paediatric nurse -Drink alcoholic beverages -Take any medication unless instructed by your physician -Make any legal decisions or sign important papers.  Meals: Start with liquid foods such as gelatin or soup. Progress to regular foods as tolerated. Avoid greasy, spicy, heavy foods. If nausea and/or vomiting occur, drink only clear liquids until the nausea and/or vomiting subsides. Call your physician if vomiting continues.  Special Instructions/Symptoms: Your throat may feel dry or sore from the anesthesia or the breathing tube placed in your throat during surgery. If this causes discomfort, gargle with warm salt water. The discomfort should disappear within 24 hours.  If you had a scopolamine patch placed  behind your ear for the management of post- operative nausea and/or vomiting:  1. The medication in the patch is effective for 72 hours, after which it should be removed.  Wrap patch in a tissue and discard in the trash. Wash hands thoroughly with soap  and water. 2. You may remove the patch earlier than 72 hours if you experience unpleasant side effects which may include dry mouth, dizziness or visual disturbances. 3. Avoid touching the patch. Wash your hands with soap and water after contact with the patch.

## 2016-12-19 NOTE — Anesthesia Procedure Notes (Signed)
Procedure Name: Intubation Date/Time: 12/19/2016 7:35 AM Performed by: Nyelle Wolfson, Sheron Nightingale Pre-anesthesia Checklist: Patient identified, Emergency Drugs available, Suction available, Patient being monitored and Timeout performed Patient Re-evaluated:Patient Re-evaluated prior to induction Oxygen Delivery Method: Circle system utilized Preoxygenation: Pre-oxygenation with 100% oxygen Induction Type: IV induction Ventilation: Mask ventilation with difficulty Laryngoscope Size: Glidescope and 3 Grade View: Grade II Tube type: Oral Tube size: 7.0 mm Number of attempts: 2 Placement Confirmation: ETT inserted through vocal cords under direct vision,  positive ETCO2 and breath sounds checked- equal and bilateral Secured at: 21 cm Dental Injury: Teeth and Oropharynx as per pre-operative assessment

## 2016-12-19 NOTE — Anesthesia Postprocedure Evaluation (Signed)
Anesthesia Post Note  Patient: Gabrielle Lynch  Procedure(s) Performed: Procedure(s) (LRB): POSTERIOR REPAIR (RECTOCELE) (N/A)     Patient location during evaluation: PACU Anesthesia Type: General Level of consciousness: sedated Pain management: pain level controlled Vital Signs Assessment: post-procedure vital signs reviewed and stable Respiratory status: spontaneous breathing and respiratory function stable Cardiovascular status: stable Anesthetic complications: no    Last Vitals:  Vitals:   12/19/16 1015 12/19/16 1028  BP: 119/63   Pulse: (!) 58 65  Resp: 16 15  Temp:    SpO2: 98% 98%    Last Pain:  Vitals:   12/19/16 0631  TempSrc: Oral   Pain Goal: Patients Stated Pain Goal: 4 (12/19/16 0631)               Duane Boston DANIEL

## 2016-12-19 NOTE — Transfer of Care (Signed)
Immediate Anesthesia Transfer of Care Note  Patient: Gabrielle Lynch  Procedure(s) Performed: Procedure(s) with comments: POSTERIOR REPAIR (RECTOCELE) (N/A) - 1.25 hours  Patient Location: PACU  Anesthesia Type:General  Level of Consciousness: awake, alert  and oriented  Airway & Oxygen Therapy: Patient Spontanous Breathing and Patient connected to nasal cannula oxygen  Post-op Assessment: Report given to RN and Post -op Vital signs reviewed and stable  Post vital signs: Reviewed and stable  Last Vitals:  Vitals:   12/19/16 0631  BP: 125/78  Pulse: 74  Resp: 18  Temp: (!) 36.4 C  SpO2: 98%    Last Pain:  Vitals:   12/19/16 0631  TempSrc: Oral      Patients Stated Pain Goal: 4 (12/52/71 2929)  Complications: No apparent anesthesia complications

## 2016-12-19 NOTE — Brief Op Note (Signed)
12/19/2016  8:56 AM  PATIENT:  Gabrielle Lynch  66 y.o. female  PRE-OPERATIVE DIAGNOSIS:  2nd degree rectocele  POST-OPERATIVE DIAGNOSIS:  2nd degree rectocele  PROCEDURE:  Procedure(s) with comments: POSTERIOR REPAIR (RECTOCELE) (N/A) - 1.25 hours  SURGEON:  Surgeon(s) and Role:    * Amundson Raliegh Ip, MD - Primary    * Talbert Nan, Francesca Jewett, MD - Assisting  PHYSICIAN ASSISTANT:  NA  ASSISTANTS: Sumner Boast, MD   ANESTHESIA:   local and general  EBL:  Total I/O In: 1000 [I.V.:1000] Out: 110 [Urine:100; Blood:10]  BLOOD ADMINISTERED:none  DRAINS: none   LOCAL MEDICATIONS USED:  LIDOCAINE   SPECIMEN:  No Specimen  DISPOSITION OF SPECIMEN:  N/A  COUNTS:  YES  TOURNIQUET:  * No tourniquets in log *  DICTATION: .Other Dictation: Dictation Number    PLAN OF CARE: Discharge to home after PACU  PATIENT DISPOSITION:  PACU - hemodynamically stable.   Delay start of Pharmacological VTE agent (>24hrs) due to surgical blood loss or risk of bleeding: not applicable

## 2016-12-19 NOTE — H&P (Signed)
Office Visit   12/13/2016 Gabrielle Lynch, Gabrielle All, MD  Obstetrics and Gynecology   Rectocele +2 more  Dx   Surgical consult ; Referred by Josetta Huddle, MD  Reason for Visit   Additional Documentation   Vitals:   BP 120/74   Pulse 66   Wt 169 lb 9.6 oz (76.9 kg)   BMI 28.44 kg/m   BSA 1.87 m   Flowsheets:   Infectious Disease Screening,   Custom Formula Data,   MEWS Score,   Anthropometrics     Encounter Info:   Billing Info,   History,   Allergies,   Detailed Report     Lynch Notes   Progress Notes by Nunzio Cobbs, MD at 12/13/2016 9:30 AM   Author: Nunzio Cobbs, MD Author Type: Physician Filed: 12/13/2016 7:44 PM  Note Status: Signed Cosign: Cosign Not Required Encounter Date: 12/13/2016  Editor: Nunzio Cobbs, MD (Physician)  Prior Versions: 1. Lowella Fairy, CMA (Certified Medical Assistant) at 12/13/2016 9:50 AM - Sign at close encounter    GYNECOLOGY  VISIT   HPI: 66 y.o.   Married  Caucasian  female   G2P2 with No LMP recorded. Patient has had a hysterectomy.   here for surgical consult.   Desires rectocele repair. Had preop clearance from her PCP.   Started to have some fecal incontinence recently.  On Effexor 75 mg and Gabapentin 200 mg at hs for hot flashes.  Having terrible hot flashes during the day only.   Struggling with decreased libido.  Wants to travel to [place] on 01/22/17.  We discussed risk of DVT/PE at this point.  GYNECOLOGIC HISTORY: No LMP recorded. Patient has had a hysterectomy. Contraception:  Hysterectomy Menopausal hormone therapy:  Vagifem Last mammogram: 12/02/15 [facility] Last pap smear: 09-07-16 Neg; 09-30-15 Neg                OB History    Gravida Para Term Preterm AB Living   2 2       2    SAB TAB Ectopic Multiple Live Births                         Patient Active Problem List   Diagnosis Date Noted  .  Postmenopausal HRT (hormone replacement therapy) 10/29/2014  . Hot flashes, menopausal 10/29/2014  . Fitting and adjustment of gastric lap band 01/14/2013  . Obese 01/14/2013  . H/O vitamin D deficiency 08/09/2012  . Unspecified hypothyroidism 08/09/2012  . Menopause 08/09/2012  . Postoperative seroma-in ventral hernia repair site 07/20/2011  . Lapband APS April 2010 07/20/2011  . Hypertension   . Mast cell disorder   . DEGENERATIVE JOINT DISEASE 02/19/2008  . SPONDYLOSIS UNSPEC SITE W/O MENTION MYELOPATHY 02/19/2008  . SINUS TARSI SYNDROME 02/19/2008  . UNEQUAL LEG LENGTH 02/19/2008        Past Medical History:  Diagnosis Date  . Abnormal Pap smear of cervix    Hx of cryotherapy to cervix in her 9s  . Abnormal uterine bleeding    history of fibroids  . Anemia   . Diabetes mellitus without complication ([place])   . DJD (degenerative joint disease)   . Fibroid   . Heart murmur    functional  . History of hypertension NO MEDS SINCE LAP GASTRIC BAND 2010 --  WT. LOSS  . Hypertension   . Hypothyroidism   .  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  STATES SHE CARRIES EPI PEN--BUT HAS NOT HAD ANY RECENT EPISODES.  STATES MANY OF THE MEDICATIONS SHE TAKES ARE TO HELP THIS PROBLEM - INCLUDING  Ketotifen-mast cell inhibitor-not approved in [place].   . Migraine   . 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 Osteopenia   . PMR (polymyalgia rheumatica) (H[facility])   . PONV (postoperative nausea and vomiting)   . Spondylosis          Past Surgical History:  Procedure Laterality Date  . ABDOMINAL HYSTERECTOMY  02/20/2011   Procedure: HYSTERECTOMY ABDOMINAL;  Surgeon: Bennetta Laos, MD;  Location: [facility];  Service: Gynecology;  Laterality: N/A;  . CERVICAL BIOPSY  W/ LOOP ELECTRODE EXCISION  1992  . CERVIX LESION DESTRUCTION    . [place]   X2  . CHOLECYSTECTOMY    . COLONOSCOPY    . DILATION AND CURETTAGE OF UTERUS     heavy menses  . HYSTEROSCOPY  06/1995   HYSTEROSCOPIC MYOMECTOMY  . HYSTEROSCOPY  06/1998   D&C, HYSTEROSCOPY  . KNEE SURGERY     X2 right knee  . LAPAROSCOPIC GASTRIC BANDING  08-03-2008   [facility] APS SYSTEM  . LESION REMOVAL N/A 09/02/2012   Procedure: EXCISION VAGINAL LESION;  Surgeon: Terrance Mass, MD;  Location: [facility];  Service: Gynecology;  Laterality: N/A;  cpt 57100  one hour  . PUBOVAGINAL SLING  02/20/2011   Procedure: Gaynelle Arabian;  Surgeon: Ailene Rud, MD;  Location: [facility];  Service: Urology;  Laterality: N/A;  . SALPINGOOPHORECTOMY  02/20/2011   Procedure: SALPINGO OOPHERECTOMY;  Surgeon: Bennetta Laos, MD;  Location: [facility];  Service: Gynecology;  Laterality: Bilateral;  . UPPER GASTROINTESTINAL ENDOSCOPY  06/2011  . VENTRAL HERNIA REPAIR  05/24/2011   Procedure: LAPAROSCOPIC VENTRAL HERNIA;  Surgeon: Pedro Earls, MD;  Location: [facility];  Service: General;  Laterality: N/A;          Current Outpatient Prescriptions  Medication Sig Dispense Refill  . cetirizine (ZYRTEC) 10 MG tablet Take 10 mg by mouth daily.     . clonazePAM (KLONOPIN) 1 MG tablet Take 1 mg by mouth at bedtime as needed for anxiety.    Marland Kitchen EPINEPHrine (EPI-PEN) 0.3 mg/0.3 mL DEVI Inject into the muscle as needed.    . Estradiol (VAGIFEM) 10 MCG TABS vaginal tablet Use every night before bed for two weeks when you first begin this medicine, then after the first two weeks, begin using it twice a week. 18 tablet 12  . fenofibrate 160 MG tablet Take 160 mg by mouth daily.    . finasteride (PROSCAR) 5 MG tablet Take 2.5 mg by mouth daily.     Marland Kitchen gabapentin (NEURONTIN) 100 MG capsule Take 2 capsules (200 mg) po bid. 120 capsule 5  . hydrochlorothiazide (HYDRODIURIL) 25 MG tablet Take 25 mg by mouth daily.    .  hydrOXYzine (ATARAX/VISTARIL) 50 MG tablet Take 1 tablet by mouth 2 (two) times daily.    . hyoscyamine (LEVSIN SL) 0.125 MG SL tablet Place 1 tablet under the tongue as needed.    . insulin aspart (NOVOLOG) cartridge Inject into the skin 3 (three) times daily with meals. Patient has insulin pump    . levothyroxine (SYNTHROID, LEVOTHROID) 137 MCG tablet Take 137 mcg by mouth  daily before breakfast.    . Linagliptin-Metformin HCl (JENTADUETO) 2.5-850 MG TABS Take 1 tablet by mouth 2 (two) times daily.    . montelukast (SINGULAIR) 10 MG tablet Take 10 mg by mouth at bedtime.     . ondansetron (ZOFRAN-ODT) 4 MG disintegrating tablet Take 1 tablet by mouth as needed.    . predniSONE (DELTASONE) 5 MG tablet Take 5 mg by mouth daily with breakfast.    . PRESCRIPTION MEDICATION Take 1 tablet by mouth 2 (two) times daily. Ketotifen 5mg  [place] For mast cell problem    . ranitidine (ZANTAC) 300 MG tablet Take 300 mg by mouth daily.    Marland Kitchen topiramate (TOPAMAX) 50 MG tablet Take 50 mg by mouth 2 (two) times daily.     Marland Kitchen venlafaxine XR (EFFEXOR-XR) 75 MG 24 hr capsule Take 75 mg by mouth daily.    . Vitamin D, Ergocalciferol, (DRISDOL) 50000 units CAPS capsule Take 50,000 Units by mouth every Saturday.     . zolmitriptan (ZOMIG) 5 MG tablet Take 5 mg by mouth as needed for migraine.     No current facility-administered medications for this visit.      ALLERGIES: Meperidine and Demerol       Family History  Problem Relation Age of Onset  . Breast cancer Mother        6's  . Hypertension Mother   . Thyroid disease Mother        hypothyroid  . Cancer Father        LUNG  . Thyroid disease Maternal Grandmother        hypothyroid    Social History        Social History  . Marital status: Married    Spouse name: N/A  . Number of children: N/A  . Years of education: N/A      Occupational History  . Not on file.          Social History Main Topics  . Smoking status: Never Smoker  . Smokeless tobacco: Never Used  . Alcohol use No     Comment: social  . Drug use: No  . Sexual activity: Yes    Partners: Male    Birth control/ protection: Surgical     Comment: TAH/BSO       Other Topics Concern  . Not on file      Social History Narrative  . No narrative on file    ROS:  Pertinent items are noted in HPI.  PHYSICAL EXAMINATION:    There were no vitals taken for this visit.    General appearance: alert, cooperative and appears stated age Head: Normocephalic, without obvious abnormality, atraumatic Neck: no adenopathy, supple, symmetrical, trachea midline and thyroid normal to inspection and palpation Lungs: clear to auscultation bilaterally Heart: regular rate and rhythm Abdomen: lap band port palpable in right upper abdomen, soft, non-tender, no masses, no organomegaly Extremities: extremities normal, atraumatic, no cyanosis or edema Skin: Skin color, texture, turgor normal. No rashes or lesions Lymph nodes: Cervical, supraclavicular, and axillary nodes normal. No abnormal inguinal nodes palpated Neurologic: Grossly normal  Pelvic: External genitalia:  no lesions              Urethra:  normal appearing urethra with no masses, tenderness or lesions              Bartholins and Skenes: normal  Vagina: normal appearing vagina with normal color and discharge, no lesions.  Second degree rectocele.              Cervix: absent.                Bimanual Exam:  Uterus: absent.               Adnexa: no mass, fullness, tenderness              Rectal exam: Yes.  .  Confirms.              Anus:  normal sphincter tone, no lesions  Chaperone was present for exam.  ASSESSMENT  Status post TAH/BSO. Rectocele.  Menopausal symptoms.  Decreased libido. Steroid use. HTN.  DM.  PLAN  Discussed rectocele native tissue repair.  Risks, benefits, and  alternatives reviewed.  Risks include but are not limited to bleeding, infection, damage to surrounding organs, reactions to anesthesia, DVT, PE, death,  dyspareunia, and need for operation. Surgical expectations and recovery discussed. Patient wishes to proceed.  Lynch preop labs and EKG reviewed in detail with the patient.  Please refer to EPIC. We talked about the Effexor and likely effect on reducing libido. She will eventually wean off of this.  Increase Gabapentin to 200 mg po bid x 5 months.  Will check with PCP regarding Clonidine patch.  She knows that some of her hot flashes may be due to other etiologies and not just menopause. May need stress dosing of steroids on the day of her procedure.   An After Visit Summary was printed and given to the patient.  __45___ minutes face to face time of which over 50% was spent in counseling.

## 2016-12-19 NOTE — Progress Notes (Signed)
Update to History and Physical  No marked change in status since office preop visit.  Patient examined.  She will receive steroids per anesthesia for stress dosing.   OK to proceed.

## 2016-12-19 NOTE — Op Note (Addendum)
NAMEJETT, KULZER NO.:  [id number]  MEDICAL RECORD NO.:  [id number]  LOCATION:                                 FACILITY:  PHYSICIAN:  Lenard Galloway, M.D.        DATE OF BIRTH:  DATE OF PROCEDURE:  12/19/2016 DATE OF DISCHARGE:                              OPERATIVE REPORT   PREOPERATIVE DIAGNOSIS:  Second-degree rectocele.  POSTOPERATIVE DIAGNOSIS:  Second-degree rectocele.  PROCEDURE:  Posterior colporrhaphy.  SURGEON:  Lenard Galloway, M.D.  ASSISTANT:  Sumner Boast, M.D.  ANESTHESIA:  General endotracheal, local with 1% lidocaine with epinephrine 1:100,000.  INTRAVENOUS FLUIDS:  1000 mL of Ringer's lactate.  ESTIMATED BLOOD LOSS:  10 mL.  URINE OUTPUT:  100 mL.  COMPLICATIONS:  None.  INDICATIONS FOR THE PROCEDURE:  The patient is a 66 year old gravida 2, para 2 Caucasian female, status post total abdominal hysterectomy and status post pubovaginal sling, who presented for routine examination and was noted to have a second-degree rectocele.  After the diagnosis of the rectocele was made, the patient was more aware of the fact that she had a vaginal obstruction, which prevented using vaginal estrogen medication.  The vaginal length was shortened and a pessary fitting was not successful.  The patient wished to proceed with a posterior colporrhaphy for surgical repair.  Risks, benefits, and alternatives were reviewed with the patient, who wishes to proceed.  FINDINGS:  Exam under anesthesia revealed an absent cervix and uterus. No pelvic masses were appreciated.  The patient had good anterior vaginal wall support.  The vaginal length was foreshortened.  The patient had a second-degree high rectocele.  Rectal examination confirmed the above.  SPECIMENS:  None.  PROCEDURE IN DETAIL:  The patient was re-identified in the preoperative holding area.  She did receive cefotetan 2 g IV for antibiotic prophylaxis and she received both TED hose and  PAS stockings for DVT prophylaxis.  The patient was escorted to the operating room where she was placed in the dorsal lithotomy position with Allen stirrups.  General endotracheal anesthesia was induced.  The patient's lower abdomen, vagina, and perineum were then sterilely prepped and she was draped.  A Foley catheter was placed inside the bladder and left to gravity drainage. Allis clamps were used to mark the posterior vaginal mucosa in the midline.  The perineal body appeared to have good support.  The vaginal mucosa was injected with 1% lidocaine with epinephrine 1:100,000.  A small triangular wedge of epithelium was excised from the perineal body and the vaginal mucosa was then incised vertically with the Metzenbaum scissors.  Sharp and blunt dissection were used to dissect the rectovaginal fascia off the overlying vaginal mucosa bilaterally.  There was very little blood loss with the dissection.  The rectocele repair was taken essentially to the top of the vaginal cuff.  Vertical mattress sutures of 0 Vicryl were used to reduce the rectocele.  Rectal exam confirmed the absence of sutures in the rectum and the absence of a defect posteriorly.  Very minimal vaginal mucosa was trimmed bilaterally.  The trimming was performed to remove the compression marks from the Allis clamps, but nothing more  than this was excised.  The posterior vaginal mucosa was then closed with a suture of 2-0 Vicryl. This was performed in a running locked fashion down to the level of the hymen.  It was then brought under the hymenal ring and transverse perineal muscles were then brought together in the midline.  The suture was then brought up to the perineal body in a subcuticular fashion as for closure of an episiotomy repair.  Care was taken to try not to bring the posterior colporrhaphy too far to the sides bilaterally in an attempt to not overcorrect the repair of the posterior colporrhaphy.   Vaginal exam confirmed 2 fingerbreadths of diameter of introitus.  Rectal exam confirmed the absence of suture in the rectum.  The patient was cleansed of Betadine.  She was awakened, extubated, and escorted to the recovery room in stable condition.  There were no complications to the procedure.  All needle, instrument, and sponge counts were correct.     Lenard Galloway, M.D.     BES/MEDQ  D:  12/19/2016  T:  12/19/2016  Job:  [id number]

## 2016-12-20 ENCOUNTER — Encounter (HOSPITAL_COMMUNITY): Payer: Self-pay | Admitting: Obstetrics and Gynecology

## 2016-12-25 ENCOUNTER — Ambulatory Visit (INDEPENDENT_AMBULATORY_CARE_PROVIDER_SITE_OTHER): Payer: Medicare Other | Admitting: Obstetrics and Gynecology

## 2016-12-25 ENCOUNTER — Encounter: Payer: Self-pay | Admitting: Obstetrics and Gynecology

## 2016-12-25 VITALS — BP 122/70 | HR 84 | Ht 63.75 in | Wt 166.8 lb

## 2016-12-25 DIAGNOSIS — N951 Menopausal and female climacteric states: Secondary | ICD-10-CM

## 2016-12-25 DIAGNOSIS — Z9889 Other specified postprocedural states: Secondary | ICD-10-CM

## 2016-12-25 MED ORDER — VENLAFAXINE HCL ER 37.5 MG PO CP24: ORAL_CAPSULE | ORAL | 1 refills | Status: DC

## 2016-12-25 NOTE — Progress Notes (Signed)
GYNECOLOGY  VISIT   HPI: 66 y.o.   Married  Caucasian  female   G2P2 with No LMP recorded. Patient has had a hysterectomy.   here for 1 week follow up POSTERIOR REPAIR (RECTOCELE) (N/A Vagina ).    Did well post op.  No pain.  Minimal vaginal bleeding.  Bowel function has returned.  Normal bladder function.   Has hot flashes and decreased libido.  I started her on Gabapentin.  She is on 200 mg po bid for less than 2 weeks.  Our goal is to stop her Effexor XR 75 mg daily.  She has been on the latter for about 10 years and was started to treat menopausal symptoms.   GYNECOLOGIC HISTORY: No LMP recorded. Patient has had a hysterectomy. Contraception: Hysterectomy Menopausal hormone therapy:  Vagifem Last mammogram:  12/02/15 [facility] Last pap smear: 09-07-16 Neg; 09-30-15 Neg        OB History    Gravida Para Term Preterm AB Living   2 2       2    SAB TAB Ectopic Multiple Live Births                     Patient Active Problem List   Diagnosis Date Noted  . Postmenopausal HRT (hormone replacement therapy) 10/29/2014  . Hot flashes, menopausal 10/29/2014  . Fitting and adjustment of gastric lap band 01/14/2013  . Obese 01/14/2013  . H/O vitamin D deficiency 08/09/2012  . Unspecified hypothyroidism 08/09/2012  . Menopause 08/09/2012  . Postoperative seroma-in ventral hernia repair site 07/20/2011  . Lapband APS April 2010 07/20/2011  . Hypertension   . Mast cell disorder   . DEGENERATIVE JOINT DISEASE 02/19/2008  . SPONDYLOSIS UNSPEC SITE W/O MENTION MYELOPATHY 02/19/2008  . SINUS TARSI SYNDROME 02/19/2008  . UNEQUAL LEG LENGTH 02/19/2008    Past Medical History:  Diagnosis Date  . Abnormal Pap smear of cervix    Hx of cryotherapy to cervix in her 65s  . Abnormal uterine bleeding    history of fibroids  . Anemia   . Diabetes mellitus without complication ([place])   . DJD (degenerative joint disease)   . Fibroid   . Heart murmur    functional   . History of hypertension NO MEDS SINCE LAP GASTRIC BAND 2010 --  WT. LOSS  . Hypertension   . Hypothyroidism   . 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  STATES SHE CARRIES EPI PEN--BUT HAS NOT HAD ANY RECENT EPISODES.  STATES MANY OF THE MEDICATIONS SHE TAKES ARE TO HELP THIS PROBLEM - INCLUDING  Ketotifen-mast cell inhibitor-not approved in [place].   . Migraine   . 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 Osteopenia   . PMR (polymyalgia rheumatica) ([facility])   . PONV (postoperative nausea and vomiting)   . Spondylosis     Past Surgical History:  Procedure Laterality Date  . ABDOMINAL HYSTERECTOMY  02/20/2011   Procedure: HYSTERECTOMY ABDOMINAL;  Surgeon: Bennetta Laos, MD;  Location: [facility];  Service: Gynecology;  Laterality: N/A;  . CERVICAL BIOPSY  W/ LOOP ELECTRODE EXCISION  1992  . CERVIX LESION DESTRUCTION    . [place]   X2  . CHOLECYSTECTOMY    . COLONOSCOPY    . DILATION AND CURETTAGE OF UTERUS     heavy  menses  . HYSTEROSCOPY  06/1995   HYSTEROSCOPIC MYOMECTOMY  . HYSTEROSCOPY  06/1998   D&C, HYSTEROSCOPY  . KNEE SURGERY     X2 right knee  . LAPAROSCOPIC GASTRIC BANDING  08-03-2008   [facility] APS SYSTEM  . LESION REMOVAL N/A 09/02/2012   Procedure: EXCISION VAGINAL LESION;  Surgeon: Terrance Mass, MD;  Location: [facility];  Service: Gynecology;  Laterality: N/A;  cpt 57100  one hour  . PUBOVAGINAL SLING  02/20/2011   Procedure: Gaynelle Arabian;  Surgeon: Ailene Rud, MD;  Location: [facility];  Service: Urology;  Laterality: N/A;  . RECTOCELE REPAIR N/A 12/19/2016   Procedure: POSTERIOR REPAIR (RECTOCELE);  Surgeon: Nunzio Cobbs, MD;  Location: [facility];  Service: Gynecology;  Laterality: N/A;  1.25 hours  . SALPINGOOPHORECTOMY  02/20/2011   Procedure: SALPINGO OOPHERECTOMY;  Surgeon:  Bennetta Laos, MD;  Location: [facility];  Service: Gynecology;  Laterality: Bilateral;  . UPPER GASTROINTESTINAL ENDOSCOPY  06/2011  . VENTRAL HERNIA REPAIR  05/24/2011   Procedure: LAPAROSCOPIC VENTRAL HERNIA;  Surgeon: Pedro Earls, MD;  Location: [facility];  Service: General;  Laterality: N/A;    Current Outpatient Prescriptions  Medication Sig Dispense Refill  . cetirizine (ZYRTEC) 10 MG tablet Take 10 mg by mouth daily.     . clonazePAM (KLONOPIN) 1 MG tablet Take 1 mg by mouth at bedtime as needed for anxiety.    Marland Kitchen EPINEPHrine (EPI-PEN) 0.3 mg/0.3 mL DEVI Inject into the muscle as needed.    . fenofibrate 160 MG tablet Take 160 mg by mouth daily.    . finasteride (PROSCAR) 5 MG tablet Take 2.5 mg by mouth daily.     Marland Kitchen gabapentin (NEURONTIN) 100 MG capsule Take 2 capsules (200 mg) po bid. 120 capsule 5  . hydrochlorothiazide (HYDRODIURIL) 25 MG tablet Take 25 mg by mouth daily.    . hydrOXYzine (ATARAX/VISTARIL) 50 MG tablet Take 1 tablet by mouth 2 (two) times daily.    . hyoscyamine (LEVSIN SL) 0.125 MG SL tablet Place 1 tablet under the tongue as needed.    Marland Kitchen ibuprofen (ADVIL,MOTRIN) 800 MG tablet Take 1 tablet (800 mg total) by mouth every 8 (eight) hours as needed. 30 tablet 0  . insulin aspart (NOVOLOG) cartridge Inject into the skin 3 (three) times daily with meals. Patient has insulin pump    . levothyroxine (SYNTHROID, LEVOTHROID) 137 MCG tablet Take 137 mcg by mouth daily before breakfast.    . Linagliptin-Metformin HCl (JENTADUETO) 2.5-850 MG TABS Take 1 tablet by mouth 2 (two) times daily.    . montelukast (SINGULAIR) 10 MG tablet Take 10 mg by mouth at bedtime.     . ondansetron (ZOFRAN-ODT) 4 MG disintegrating tablet Take 1 tablet by mouth as needed.    Marland Kitchen oxyCODONE-acetaminophen (PERCOCET) 5-325 MG tablet Take 2 tablets by mouth every 4 (four) hours as needed. use only as much as needed to relieve pain 30 tablet 0  . predniSONE (DELTASONE) 5 MG tablet Take 5 mg by mouth  daily with breakfast.    . PRESCRIPTION MEDICATION Take 1 tablet by mouth 2 (two) times daily. Ketotifen 5mg  [place] For mast cell problem    . ranitidine (ZANTAC) 300 MG tablet Take 300 mg by mouth daily.    Marland Kitchen topiramate (TOPAMAX) 50 MG tablet Take 50 mg by mouth 2 (two) times daily.     Marland Kitchen venlafaxine XR (EFFEXOR-XR) 75 MG 24 hr capsule Take  75 mg by mouth daily.    . Vitamin D, Ergocalciferol, (DRISDOL) 50000 units CAPS capsule Take 50,000 Units by mouth every Saturday.     . zolmitriptan (ZOMIG) 5 MG tablet Take 5 mg by mouth as needed for migraine.    . Estradiol 10 MCG TABS vaginal tablet Place 1 suppository vaginally 2 (two) times daily.     No current facility-administered medications for this visit.      ALLERGIES: Meperidine and Demerol  Family History  Problem Relation Age of Onset  . Breast cancer Mother        46's  . Hypertension Mother   . Thyroid disease Mother        hypothyroid  . Cancer Father        LUNG  . Thyroid disease Maternal Grandmother        hypothyroid    Social History   Social History  . Marital status: Married    Spouse name: N/A  . Number of children: N/A  . Years of education: N/A   Occupational History  . Not on file.   Social History Main Topics  . Smoking status: Never Smoker  . Smokeless tobacco: Never Used  . Alcohol use No     Comment: social  . Drug use: No  . Sexual activity: Yes    Partners: Male    Birth control/ protection: Surgical     Comment: TAH/BSO   Other Topics Concern  . Not on file   Social History Narrative  . No narrative on file    ROS:  Pertinent items are noted in HPI.  PHYSICAL EXAMINATION:    BP 122/70 (BP Location: Right Arm, Patient Position: Sitting, Cuff Size: Normal)   Pulse 84   Ht 5' 3.75" (1.619 m)   Wt 166 lb 12.8 oz (75.7 kg)   BMI 28.86 kg/m     General appearance: alert, cooperative and appears stated age   Pelvic: External genitalia:  no  lesions              Urethra:  normal appearing urethra with no masses, tenderness or lesions              Bartholins and Skenes: normal                 Vagina: normal appearing vagina with normal color and discharge, no lesions              Cervix:  Absent.                Bimanual Exam:  Uterus:   Absent.   Suture line is intact posteriorly.              Adnexa: no mass, fullness, tenderness          Chaperone was present for exam.  ASSESSMENT  Status post rectocele repair.  Menopausal symptoms.  Decreased libido.   PLAN  Continue decreased activity.  Patient is planning some trips and I told her it was [facility] as long as she hydrates well, uses her compression hose, and is aware of increased risk of DVT.  We reviewed signs and symptoms of this.  She will follow up in 4 weeks for her next check.  She will decrease her Effexor to 37.5 mg alternating with 75 mg every other day for about 2 months and then wean down to Effexor 37.5 mg daily.  She may then take every other day and then stop.  She will  continue with the Gabapentin 200 mg po bid . We talked about interaction of Gabapentin with some of her other medications such as the Clonazepam and that this can cause increased somnolence.  She will be mindful of this.  I did review her prednisone and alterations in blood sugar as potential reasons to have increased sweating.    An After Visit Summary was printed and given to the patient.

## 2017-01-05 ENCOUNTER — Ambulatory Visit (INDEPENDENT_AMBULATORY_CARE_PROVIDER_SITE_OTHER): Payer: Medicare Other | Admitting: Psychology

## 2017-01-05 DIAGNOSIS — F3489 Other specified persistent mood disorders: Secondary | ICD-10-CM | POA: Diagnosis not present

## 2017-01-10 ENCOUNTER — Ambulatory Visit: Payer: Medicare Other | Admitting: Obstetrics and Gynecology

## 2017-01-11 DIAGNOSIS — H52213 Irregular astigmatism, bilateral: Secondary | ICD-10-CM | POA: Diagnosis not present

## 2017-01-11 DIAGNOSIS — Q079 Congenital malformation of nervous system, unspecified: Secondary | ICD-10-CM | POA: Diagnosis not present

## 2017-01-11 DIAGNOSIS — H25013 Cortical age-related cataract, bilateral: Secondary | ICD-10-CM | POA: Diagnosis not present

## 2017-01-11 DIAGNOSIS — H43813 Vitreous degeneration, bilateral: Secondary | ICD-10-CM | POA: Diagnosis not present

## 2017-01-19 NOTE — Progress Notes (Signed)
GYNECOLOGY  VISIT   HPI: 66 y.o.   Married  Caucasian  female   G2P2 with No LMP recorded. Patient has had a hysterectomy.   here for 5 week follow up POSTERIOR REPAIR (RECTOCELE) (N/A Vagina ).    Feeling good post op.  Energy Ok.  No bleeding or pain.   She is alternating her prednisone between 5 and 4 tabs per day.  Hopefully will be reducing down to 4 tabs per day.   Traveling to [place] soon.  She will use her compression hose.  GYNECOLOGIC HISTORY: No LMP recorded. Patient has had a hysterectomy. Contraception:  Hysterectomy Menopausal hormone therapy:  Vagifem Last mammogram:  12/02/15 [facility] Last pap smear:  09-07-16 Neg; 09-30-15 Neg        OB History    Gravida Para Term Preterm AB Living   2 2       2    SAB TAB Ectopic Multiple Live Births                     Patient Active Problem List   Diagnosis Date Noted  . Postmenopausal HRT (hormone replacement therapy) 10/29/2014  . Hot flashes, menopausal 10/29/2014  . Fitting and adjustment of gastric lap band 01/14/2013  . Obese 01/14/2013  . H/O vitamin D deficiency 08/09/2012  . Unspecified hypothyroidism 08/09/2012  . Menopause 08/09/2012  . Postoperative seroma-in ventral hernia repair site 07/20/2011  . Lapband APS April 2010 07/20/2011  . Hypertension   . Mast cell disorder   . DEGENERATIVE JOINT DISEASE 02/19/2008  . SPONDYLOSIS UNSPEC SITE W/O MENTION MYELOPATHY 02/19/2008  . SINUS TARSI SYNDROME 02/19/2008  . UNEQUAL LEG LENGTH 02/19/2008    Past Medical History:  Diagnosis Date  . Abnormal Pap smear of cervix    Hx of cryotherapy to cervix in her 72s  . Abnormal uterine bleeding    history of fibroids  . Anemia   . Diabetes mellitus without complication ([place])   . DJD (degenerative joint disease)   . Fibroid   . Heart murmur    functional  . History of hypertension NO MEDS SINCE LAP GASTRIC BAND 2010 --  WT. LOSS  . Hypertension   . Hypothyroidism   . 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  STATES SHE CARRIES EPI PEN--BUT HAS NOT HAD ANY RECENT EPISODES.  STATES MANY OF THE MEDICATIONS SHE TAKES ARE TO HELP THIS PROBLEM - INCLUDING  Ketotifen-mast cell inhibitor-not approved in [place].   . Migraine   . 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 Osteopenia   . PMR (polymyalgia rheumatica) ([facility])   . PONV (postoperative nausea and vomiting)   . Spondylosis     Past Surgical History:  Procedure Laterality Date  . ABDOMINAL HYSTERECTOMY  02/20/2011   Procedure: HYSTERECTOMY ABDOMINAL;  Surgeon: Bennetta Laos, MD;  Location: [facility];  Service: Gynecology;  Laterality: N/A;  . CERVICAL BIOPSY  W/ LOOP ELECTRODE EXCISION  1992  . CERVIX LESION DESTRUCTION    . [place]   X2  . CHOLECYSTECTOMY    . COLONOSCOPY    . DILATION AND CURETTAGE OF UTERUS     heavy menses  . HYSTEROSCOPY  06/1995   HYSTEROSCOPIC MYOMECTOMY  . HYSTEROSCOPY  06/1998   D&C, HYSTEROSCOPY  . KNEE SURGERY  X2 right knee  . LAPAROSCOPIC GASTRIC BANDING  08-03-2008   [facility] APS SYSTEM  . LESION REMOVAL N/A 09/02/2012   Procedure: EXCISION VAGINAL LESION;  Surgeon: Terrance Mass, MD;  Location: [facility];  Service: Gynecology;  Laterality: N/A;  cpt 57100  one hour  . PUBOVAGINAL SLING  02/20/2011   Procedure: Gaynelle Arabian;  Surgeon: Ailene Rud, MD;  Location: [facility];  Service: Urology;  Laterality: N/A;  . RECTOCELE REPAIR N/A 12/19/2016   Procedure: POSTERIOR REPAIR (RECTOCELE);  Surgeon: Nunzio Cobbs, MD;  Location: [facility];  Service: Gynecology;  Laterality: N/A;  1.25 hours  . SALPINGOOPHORECTOMY  02/20/2011   Procedure: SALPINGO OOPHERECTOMY;  Surgeon: Bennetta Laos, MD;  Location: [facility];  Service: Gynecology;  Laterality: Bilateral;  . UPPER GASTROINTESTINAL ENDOSCOPY  06/2011   . VENTRAL HERNIA REPAIR  05/24/2011   Procedure: LAPAROSCOPIC VENTRAL HERNIA;  Surgeon: Pedro Earls, MD;  Location: [facility];  Service: General;  Laterality: N/A;    Current Outpatient Prescriptions  Medication Sig Dispense Refill  . cetirizine (ZYRTEC) 10 MG tablet Take 10 mg by mouth daily.     . clonazePAM (KLONOPIN) 1 MG tablet Take 1 mg by mouth at bedtime as needed for anxiety.    Marland Kitchen EPINEPHrine (EPI-PEN) 0.3 mg/0.3 mL DEVI Inject into the muscle as needed.    . Estradiol 10 MCG TABS vaginal tablet Place 1 suppository vaginally 2 (two) times daily.    . fenofibrate 160 MG tablet Take 160 mg by mouth daily.    . finasteride (PROSCAR) 5 MG tablet Take 2.5 mg by mouth daily.     Marland Kitchen gabapentin (NEURONTIN) 100 MG capsule Take 2 capsules (200 mg) po bid. 120 capsule 5  . hydrochlorothiazide (HYDRODIURIL) 25 MG tablet Take 25 mg by mouth daily.    . hydrOXYzine (ATARAX/VISTARIL) 50 MG tablet Take 1 tablet by mouth 2 (two) times daily.    . hyoscyamine (LEVSIN SL) 0.125 MG SL tablet Place 1 tablet under the tongue as needed.    Marland Kitchen ibuprofen (ADVIL,MOTRIN) 800 MG tablet Take 1 tablet (800 mg total) by mouth every 8 (eight) hours as needed. 30 tablet 0  . insulin aspart (NOVOLOG) cartridge Inject into the skin 3 (three) times daily with meals. Patient has insulin pump    . levothyroxine (SYNTHROID, LEVOTHROID) 137 MCG tablet Take 137 mcg by mouth daily before breakfast.    . Linagliptin-Metformin HCl (JENTADUETO) 2.5-850 MG TABS Take 1 tablet by mouth 2 (two) times daily.    . montelukast (SINGULAIR) 10 MG tablet Take 10 mg by mouth at bedtime.     . ondansetron (ZOFRAN-ODT) 4 MG disintegrating tablet Take 1 tablet by mouth as needed.    Marland Kitchen oxyCODONE-acetaminophen (PERCOCET) 5-325 MG tablet Take 2 tablets by mouth every 4 (four) hours as needed. use only as much as needed to relieve pain 30 tablet 0  . predniSONE (DELTASONE) 5 MG tablet Take 5 mg by mouth daily with breakfast. Alternating between  4mg  and 5mg  daily    . PRESCRIPTION MEDICATION Take 1 tablet by mouth 2 (two) times daily. Ketotifen 5mg  table[place] For mast cell problem    . ranitidine (ZANTAC) 300 MG tablet Take 300 mg by mouth daily.    Marland Kitchen topiramate (TOPAMAX) 50 MG tablet Take 50 mg by mouth 2 (two) times daily.     Marland Kitchen venlafaxine XR (EFFEXOR XR) 37.5 MG 24 hr capsule Take one capsule  of 37.5 mg every other day and alternate this with 75 mg every other day. 45 capsule 1  . venlafaxine XR (EFFEXOR-XR) 75 MG 24 hr capsule Take 75 mg by mouth daily.    . Vitamin D, Ergocalciferol, (DRISDOL) 50000 units CAPS capsule Take 50,000 Units by mouth every Saturday.     . zolmitriptan (ZOMIG) 5 MG tablet Take 5 mg by mouth as needed for migraine.    . predniSONE (DELTASONE) 1 MG tablet Alternating between 4mg  and 5mg  daily     No current facility-administered medications for this visit.      ALLERGIES: Meperidine and Demerol  Family History  Problem Relation Age of Onset  . Breast cancer Mother        57's  . Hypertension Mother   . Thyroid disease Mother        hypothyroid  . Cancer Father        LUNG  . Thyroid disease Maternal Grandmother        hypothyroid    Social History   Social History  . Marital status: Married    Spouse name: N/A  . Number of children: N/A  . Years of education: N/A   Occupational History  . Not on file.   Social History Main Topics  . Smoking status: Never Smoker  . Smokeless tobacco: Never Used  . Alcohol use No     Comment: social  . Drug use: No  . Sexual activity: Yes    Partners: Male    Birth control/ protection: Surgical     Comment: TAH/BSO   Other Topics Concern  . Not on file   Social History Narrative  . No narrative on file    ROS:  Pertinent items are noted in HPI.  PHYSICAL EXAMINATION:    BP 112/64 (BP Location: Right Arm, Patient Position: Sitting, Cuff Size: Normal)   Pulse 70   Ht 5' 3.75" (1.619 m)   Wt 166 lb 12.8 oz  (75.7 kg)   BMI 28.86 kg/m     General appearance: alert, cooperative and appears stated age    Pelvic: External genitalia:  no lesions              Urethra:  normal appearing urethra with no masses, tenderness or lesions              Bartholins and Skenes: normal                 Vagina: normal appearing vagina with normal color and discharge, no lesions.  Suture material present on posterior vaginal wall.              Cervix:  Absent.                 Bimanual Exam:  Uterus:  Absent.               Adnexa: no mass, fullness, tenderness                Chaperone was present for exam.  ASSESSMENT  Status post rectocele repair.  Healing well.  Due for mammogram.  Hx of use of Vagifem.   PLAN  Resume Vagifem 10 mcg pv twice weekly.  Disp:  8, RF 2.  She will schedule her mammogram.  Nothing per vaginal until next post op visit in 6 weeks.    An After Visit Summary was printed and given to the patient.

## 2017-01-22 ENCOUNTER — Ambulatory Visit (INDEPENDENT_AMBULATORY_CARE_PROVIDER_SITE_OTHER): Payer: Medicare Other | Admitting: Obstetrics and Gynecology

## 2017-01-22 ENCOUNTER — Encounter: Payer: Self-pay | Admitting: Obstetrics and Gynecology

## 2017-01-22 VITALS — BP 112/64 | HR 70 | Ht 63.75 in | Wt 166.8 lb

## 2017-01-22 DIAGNOSIS — Z9889 Other specified postprocedural states: Secondary | ICD-10-CM

## 2017-01-22 MED ORDER — ESTRADIOL 10 MCG VA TABS: ORAL_TABLET | VAGINAL | 2 refills | Status: DC

## 2017-01-24 ENCOUNTER — Other Ambulatory Visit: Payer: Self-pay | Admitting: Internal Medicine

## 2017-01-24 DIAGNOSIS — Z1231 Encounter for screening mammogram for malignant neoplasm of breast: Secondary | ICD-10-CM

## 2017-01-31 ENCOUNTER — Ambulatory Visit: Payer: Medicare Other | Admitting: Obstetrics and Gynecology

## 2017-02-05 ENCOUNTER — Ambulatory Visit: Payer: Self-pay

## 2017-02-06 ENCOUNTER — Ambulatory Visit: Payer: Medicare Other | Admitting: Psychology

## 2017-02-08 ENCOUNTER — Other Ambulatory Visit: Payer: Self-pay | Admitting: Plastic Surgery

## 2017-02-08 DIAGNOSIS — L821 Other seborrheic keratosis: Secondary | ICD-10-CM | POA: Diagnosis not present

## 2017-02-12 ENCOUNTER — Ambulatory Visit
Admission: RE | Admit: 2017-02-12 | Discharge: 2017-02-12 | Disposition: A | Discharge status: Home / Self Care | Payer: Medicare Other | Source: Ambulatory Visit | Attending: Internal Medicine | Admitting: Internal Medicine

## 2017-02-12 DIAGNOSIS — Z1231 Encounter for screening mammogram for malignant neoplasm of breast: Secondary | ICD-10-CM

## 2017-02-14 DIAGNOSIS — M2550 Pain in unspecified joint: Secondary | ICD-10-CM | POA: Diagnosis not present

## 2017-02-14 DIAGNOSIS — E663 Overweight: Secondary | ICD-10-CM | POA: Diagnosis not present

## 2017-02-14 DIAGNOSIS — M316 Other giant cell arteritis: Secondary | ICD-10-CM | POA: Diagnosis not present

## 2017-02-14 DIAGNOSIS — M353 Polymyalgia rheumatica: Secondary | ICD-10-CM | POA: Diagnosis not present

## 2017-02-14 DIAGNOSIS — Z6828 Body mass index (BMI) 28.0-28.9, adult: Secondary | ICD-10-CM | POA: Diagnosis not present

## 2017-02-14 DIAGNOSIS — Z7952 Long term (current) use of systemic steroids: Secondary | ICD-10-CM | POA: Diagnosis not present

## 2017-02-20 DIAGNOSIS — D631 Anemia in chronic kidney disease: Secondary | ICD-10-CM | POA: Diagnosis not present

## 2017-02-20 DIAGNOSIS — E1122 Type 2 diabetes mellitus with diabetic chronic kidney disease: Secondary | ICD-10-CM | POA: Diagnosis not present

## 2017-02-20 DIAGNOSIS — Z9641 Presence of insulin pump (external) (internal): Secondary | ICD-10-CM | POA: Diagnosis not present

## 2017-02-20 DIAGNOSIS — Z23 Encounter for immunization: Secondary | ICD-10-CM | POA: Diagnosis not present

## 2017-02-20 DIAGNOSIS — E1165 Type 2 diabetes mellitus with hyperglycemia: Secondary | ICD-10-CM | POA: Diagnosis not present

## 2017-02-20 DIAGNOSIS — I129 Hypertensive chronic kidney disease with stage 1 through stage 4 chronic kidney disease, or unspecified chronic kidney disease: Secondary | ICD-10-CM | POA: Diagnosis not present

## 2017-02-20 DIAGNOSIS — N183 Chronic kidney disease, stage 3 (moderate): Secondary | ICD-10-CM | POA: Diagnosis not present

## 2017-03-05 ENCOUNTER — Encounter (HOSPITAL_COMMUNITY): Payer: Self-pay

## 2017-03-07 ENCOUNTER — Other Ambulatory Visit: Payer: Self-pay

## 2017-03-07 ENCOUNTER — Encounter: Payer: Self-pay | Admitting: Obstetrics and Gynecology

## 2017-03-07 ENCOUNTER — Ambulatory Visit (INDEPENDENT_AMBULATORY_CARE_PROVIDER_SITE_OTHER): Payer: Medicare Other | Admitting: Obstetrics and Gynecology

## 2017-03-07 VITALS — BP 110/66 | HR 76 | Ht 63.75 in | Wt 173.6 lb

## 2017-03-07 DIAGNOSIS — Z9889 Other specified postprocedural states: Secondary | ICD-10-CM

## 2017-03-07 MED ORDER — ESTRADIOL 2 MG VA RING: 2.0000 mg | VAGINAL_RING | VAGINAL | 3 refills | Status: DC

## 2017-03-07 MED ORDER — ESTRADIOL 0.1 MG/GM VA CREA: TOPICAL_CREAM | VAGINAL | 1 refills | Status: DC

## 2017-03-07 NOTE — Progress Notes (Signed)
GYNECOLOGY  VISIT   HPI: 66 y.o.   Married  Caucasian  female   G2P2 with No LMP recorded. Patient has had a hysterectomy.   here for 12 week follow up Posterior repair (rectocele).  Not remembering to use the Vagifem.  Loved the Estring in the past but it would not stay in due to her prolapse.  Good control of gas and stool. States stool is hard.  States she is due for colonoscopy.   GYNECOLOGIC HISTORY: No LMP recorded. Patient has had a hysterectomy. Contraception: Hysterectomy Menopausal hormone therapy:  Vagifem Last mammogram: 02-12-17 [facility] B/Neg/BiRads1:TBC Last pap smear:  09-07-16 Neg; 09-30-15 Neg        OB History    Gravida Para Term Preterm AB Living   2 2       2    SAB TAB Ectopic Multiple Live Births                     Patient Active Problem List   Diagnosis Date Noted  . Postmenopausal HRT (hormone replacement therapy) 10/29/2014  . Hot flashes, menopausal 10/29/2014  . Fitting and adjustment of gastric lap band 01/14/2013  . Obese 01/14/2013  . H/O vitamin D deficiency 08/09/2012  . Unspecified hypothyroidism 08/09/2012  . Menopause 08/09/2012  . Postoperative seroma-in ventral hernia repair site 07/20/2011  . Lapband APS April 2010 07/20/2011  . Hypertension   . Mast cell disorder   . DEGENERATIVE JOINT DISEASE 02/19/2008  . SPONDYLOSIS UNSPEC SITE W/O MENTION MYELOPATHY 02/19/2008  . SINUS TARSI SYNDROME 02/19/2008  . UNEQUAL LEG LENGTH 02/19/2008    Past Medical History:  Diagnosis Date  . Abnormal Pap smear of cervix    Hx of cryotherapy to cervix in her 68s  . Abnormal uterine bleeding    history of fibroids  . Anemia   . Diabetes mellitus without complication ([place])   . DJD (degenerative joint disease)   . Fibroid   . Heart murmur    functional  . History of hypertension NO MEDS SINCE LAP GASTRIC BAND 2010 --  WT. LOSS  . Hypertension   . Hypothyroidism   . Mast cell disorder diag 1989   PT STATES SHE HAS "INAPPROPIATE MAST CELL  ACTIVATION SYNDROME" --WILL USUALLY HAVE N&V AND THEN NO BLOOD PRESSURE.  STATES SHE CARRIES EPI PEN--BUT HAS NOT HAD ANY RECENT EPISODES.  STATES MANY OF THE MEDICATIONS SHE TAKES ARE TO HELP THIS PROBLEM - INCLUDING  Ketotifen-mast cell inhibitor-not approved in [place].   . Migraine   . 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 Osteopenia   . PMR (polymyalgia rheumatica) ([facility])   . PONV (postoperative nausea and vomiting)   . Spondylosis     Past Surgical History:  Procedure Laterality Date  . BREAST BIOPSY    . CERVICAL BIOPSY  W/ LOOP ELECTRODE EXCISION  1992  . CERVIX LESION DESTRUCTION    . [place]   X2  . CHOLECYSTECTOMY    . COLONOSCOPY    . DILATION AND CURETTAGE OF UTERUS     heavy menses  . HYSTEROSCOPY  06/1995   HYSTEROSCOPIC MYOMECTOMY  . HYSTEROSCOPY  06/1998   D&C, HYSTEROSCOPY  . KNEE SURGERY     X2 right knee  . LAPAROSCOPIC GASTRIC BANDING  08-03-2008   [facility] APS SYSTEM  . UPPER GASTROINTESTINAL ENDOSCOPY  06/2011    Current Outpatient Medications  Medication Sig Dispense Refill  . cetirizine (ZYRTEC) 10  MG tablet Take 10 mg by mouth daily.     . clonazePAM (KLONOPIN) 1 MG tablet Take 1 mg by mouth at bedtime as needed for anxiety.    Marland Kitchen EPINEPHrine (EPI-PEN) 0.3 mg/0.3 mL DEVI Inject into the muscle as needed.    . Estradiol 10 MCG TABS vaginal tablet Place one tablet (10 mcg) per vagina at bedtime twice weekly. 8 tablet 2  . fenofibrate 160 MG tablet Take 160 mg by mouth daily.    . finasteride (PROSCAR) 5 MG tablet Take 2.5 mg by mouth daily.     Marland Kitchen gabapentin (NEURONTIN) 100 MG capsule Take 2 capsules (200 mg) po bid. 120 capsule 5  . hydrochlorothiazide (HYDRODIURIL) 25 MG tablet Take 25 mg by mouth daily.    . hydrOXYzine (ATARAX/VISTARIL) 50 MG tablet Take 1 tablet by mouth 2 (two) times daily.    . hyoscyamine (LEVSIN SL) 0.125 MG SL tablet Place 1 tablet under the tongue as needed.    Marland Kitchen ibuprofen (ADVIL,MOTRIN) 800  MG tablet Take 1 tablet (800 mg total) by mouth every 8 (eight) hours as needed. 30 tablet 0  . insulin aspart (NOVOLOG) cartridge Inject into the skin 3 (three) times daily with meals. Patient has insulin pump    . levothyroxine (SYNTHROID, LEVOTHROID) 137 MCG tablet Take 137 mcg by mouth daily before breakfast.    . Linagliptin-Metformin HCl (JENTADUETO) 2.5-850 MG TABS Take 1 tablet by mouth 2 (two) times daily.    . montelukast (SINGULAIR) 10 MG tablet Take 10 mg by mouth at bedtime.     . ondansetron (ZOFRAN-ODT) 4 MG disintegrating tablet Take 1 tablet by mouth as needed.    Marland Kitchen oxyCODONE-acetaminophen (PERCOCET) 5-325 MG tablet Take 2 tablets by mouth every 4 (four) hours as needed. use only as much as needed to relieve pain 30 tablet 0  . predniSONE (DELTASONE) 1 MG tablet Alternating between 4mg  and 5mg  daily    . PRESCRIPTION MEDICATION Take 1 tablet by mouth 2 (two) times daily. Ketotifen 5mg  table[place] For mast cell problem    . ranitidine (ZANTAC) 300 MG tablet Take 300 mg by mouth daily.    Marland Kitchen topiramate (TOPAMAX) 50 MG tablet Take 50 mg by mouth 2 (two) times daily.     Marland Kitchen UNABLE TO FIND Med Name: Ketotifen 5mg  . Patient takes 1 tablet daily    . venlafaxine XR (EFFEXOR XR) 37.5 MG 24 hr capsule Take one capsule of 37.5 mg every other day and alternate this with 75 mg every other day. 45 capsule 1  . venlafaxine XR (EFFEXOR-XR) 75 MG 24 hr capsule Take 75 mg by mouth daily.    . Vitamin D, Ergocalciferol, (DRISDOL) 50000 units CAPS capsule Take 50,000 Units by mouth every Saturday.     . zolmitriptan (ZOMIG) 5 MG tablet Take 5 mg by mouth as needed for migraine.     No current facility-administered medications for this visit.      ALLERGIES: Meperidine and Demerol  Family History  Problem Relation Age of Onset  . Breast cancer Mother        58's  . Hypertension Mother   . Thyroid disease Mother        hypothyroid  . Cancer Father        LUNG  .  Thyroid disease Maternal Grandmother        hypothyroid    Social History   Socioeconomic History  . Marital status: Married    Spouse  name: Not on file  . Number of children: Not on file  . Years of education: Not on file  . Highest education level: Not on file  Social Needs  . Financial resource strain: Not on file  . Food insecurity - worry: Not on file  . Food insecurity - inability: Not on file  . Transportation needs - medical: Not on file  . Transportation needs - non-medical: Not on file  Occupational History  . Not on file  Tobacco Use  . Smoking status: Never Smoker  . Smokeless tobacco: Never Used  Substance and Sexual Activity  . Alcohol use: No    Alcohol/week: 0.0 oz    Comment: social  . Drug use: No  . Sexual activity: Yes    Partners: Male    Birth control/protection: Surgical    Comment: TAH/BSO  Other Topics Concern  . Not on file  Social History Narrative  . Not on file    ROS:  Pertinent items are noted in HPI.  PHYSICAL EXAMINATION:    BP 110/66 (BP Location: Right Arm, Patient Position: Sitting, Cuff Size: Normal)   Pulse 76   Ht 5' 3.75" (1.619 m)   Wt 173 lb 9.6 oz (78.7 kg)   BMI 30.03 kg/m     General appearance: alert, cooperative and appears stated age  Pelvic: External genitalia:  no lesions              Urethra:  normal appearing urethra with no masses, tenderness or lesions              Bartholins and Skenes: normal                 Vagina: normal appearing vagina with normal color and discharge, no lesions.  Introitus with a tight 2 finger breaths.  Vaginal apex with good caliber.              Cervix:  Absent.                 Bimanual Exam:  Uterus:   Absent.               Adnexa: no mass, fullness, tenderness            Chaperone was present for exam.  ASSESSMENT  Status post rectocele repair.  Postmenopausal vaginal atrophy.   PLAN  I discussed the vaginal diameter and the need to start using vaginal estrogen  regularly and possibly even using vaginal dilators. Stop Vagifem. Start Estring.  Rx for one year.  I also prescribed vaginal Estrace cream to massage into the introitus.  Increase fiber and water in diet and start probiotics daily.  OK for colonoscopy. Patient will return in 1 month for a recheck.    An After Visit Summary was printed and given to the patient.

## 2017-03-08 ENCOUNTER — Telehealth: Payer: Self-pay | Admitting: Obstetrics and Gynecology

## 2017-03-08 NOTE — Telephone Encounter (Signed)
Patient was prescribed a vaginal ring but she is not able to insert.

## 2017-03-08 NOTE — Telephone Encounter (Signed)
Spoke with patient. Patient states she is experiencing difficulty inserting Estring d/t vaginal tightness. States this is the first one since surgery 3 months ago.   Recommended OV, patient declined appt offered for today, scheduled for 11/9 at 9:15am.   Advised patient Dr. Quincy Simmonds will review, will return call with any additional recommendations, patient is agreeable.   Routing to provider for final review. Patient is agreeable to disposition. Will close encounter.

## 2017-03-09 ENCOUNTER — Encounter: Payer: Self-pay | Admitting: Obstetrics and Gynecology

## 2017-03-09 ENCOUNTER — Other Ambulatory Visit: Payer: Self-pay

## 2017-03-09 ENCOUNTER — Ambulatory Visit (INDEPENDENT_AMBULATORY_CARE_PROVIDER_SITE_OTHER): Payer: Medicare Other | Admitting: Obstetrics and Gynecology

## 2017-03-09 VITALS — BP 104/70 | HR 84 | Resp 16 | Wt 173.0 lb

## 2017-03-09 DIAGNOSIS — E038 Other specified hypothyroidism: Secondary | ICD-10-CM | POA: Diagnosis not present

## 2017-03-09 DIAGNOSIS — Z9884 Bariatric surgery status: Secondary | ICD-10-CM | POA: Diagnosis not present

## 2017-03-09 DIAGNOSIS — E559 Vitamin D deficiency, unspecified: Secondary | ICD-10-CM | POA: Diagnosis not present

## 2017-03-09 DIAGNOSIS — N183 Chronic kidney disease, stage 3 (moderate): Secondary | ICD-10-CM | POA: Diagnosis not present

## 2017-03-09 DIAGNOSIS — N952 Postmenopausal atrophic vaginitis: Secondary | ICD-10-CM

## 2017-03-09 DIAGNOSIS — Z6829 Body mass index (BMI) 29.0-29.9, adult: Secondary | ICD-10-CM | POA: Diagnosis not present

## 2017-03-09 DIAGNOSIS — I10 Essential (primary) hypertension: Secondary | ICD-10-CM | POA: Diagnosis not present

## 2017-03-09 DIAGNOSIS — E1129 Type 2 diabetes mellitus with other diabetic kidney complication: Secondary | ICD-10-CM | POA: Diagnosis not present

## 2017-03-09 DIAGNOSIS — E7849 Other hyperlipidemia: Secondary | ICD-10-CM | POA: Diagnosis not present

## 2017-03-09 NOTE — Progress Notes (Signed)
GYNECOLOGY  VISIT   HPI: 66 y.o.   Married  Caucasian  female   G2P2 with No LMP recorded. Patient has had a hysterectomy.   here for   Check of Estring.   Had difficulty placing the ring.  Not painful, just had the feeling it was coming out.  She could not use the Estring prior to surgery because it would fall out due to her prolapse.  Now she has had rectocele repair.  She has Vagifem tablets but was forgetting to use them.   GYNECOLOGIC HISTORY: No LMP recorded. Patient has had a hysterectomy. Contraception:  Hysterectomy Menopausal hormone therapy:  Estring Last mammogram:  02-12-17 Density B/Neg/BiRads1:TBC Last pap smear:09-07-16 Neg; 09-30-15 Neg        OB History    Gravida Para Term Preterm AB Living   2 2       2    SAB TAB Ectopic Multiple Live Births                     Patient Active Problem List   Diagnosis Date Noted  . Postmenopausal HRT (hormone replacement therapy) 10/29/2014  . Hot flashes, menopausal 10/29/2014  . Fitting and adjustment of gastric lap band 01/14/2013  . Obese 01/14/2013  . H/O vitamin D deficiency 08/09/2012  . Unspecified hypothyroidism 08/09/2012  . Menopause 08/09/2012  . Postoperative seroma-in ventral hernia repair site 07/20/2011  . Lapband APS April 2010 07/20/2011  . Hypertension   . Mast cell disorder   . DEGENERATIVE JOINT DISEASE 02/19/2008  . SPONDYLOSIS UNSPEC SITE W/O MENTION MYELOPATHY 02/19/2008  . SINUS TARSI SYNDROME 02/19/2008  . UNEQUAL LEG LENGTH 02/19/2008    Past Medical History:  Diagnosis Date  . Abnormal Pap smear of cervix    Hx of cryotherapy to cervix in her 1s  . Abnormal uterine bleeding    history of fibroids  . Anemia   . Diabetes mellitus without complication ([place])   . DJD (degenerative joint disease)   . Fibroid   . Heart murmur    functional  . History of hypertension NO MEDS SINCE LAP GASTRIC BAND 2010 --  WT. LOSS  . Hypertension   . Hypothyroidism   . 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  STATES SHE CARRIES EPI PEN--BUT HAS NOT HAD ANY RECENT EPISODES.  STATES MANY OF THE MEDICATIONS SHE TAKES ARE TO HELP THIS PROBLEM - INCLUDING  Ketotifen-mast cell inhibitor-not approved in [place].   . Migraine   . 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 Osteopenia   . PMR (polymyalgia rheumatica) ([facility])   . PONV (postoperative nausea and vomiting)   . Spondylosis     Past Surgical History:  Procedure Laterality Date  . BREAST BIOPSY    . CERVICAL BIOPSY  W/ LOOP ELECTRODE EXCISION  1992  . CERVIX LESION DESTRUCTION    . [place]   X2  . CHOLECYSTECTOMY    . COLONOSCOPY    . DILATION AND CURETTAGE OF UTERUS     heavy menses  . HYSTEROSCOPY  06/1995   HYSTEROSCOPIC MYOMECTOMY  . HYSTEROSCOPY  06/1998   D&C, HYSTEROSCOPY  . KNEE SURGERY     X2 right knee  . LAPAROSCOPIC GASTRIC BANDING  08-03-2008   [facility] APS SYSTEM  . UPPER GASTROINTESTINAL ENDOSCOPY  06/2011    Current Outpatient  Medications  Medication Sig Dispense Refill  . cetirizine (ZYRTEC) 10 MG tablet Take 10 mg by mouth daily.     . clonazePAM (KLONOPIN) 1 MG tablet Take 1 mg by mouth at bedtime as needed for anxiety.    Marland Kitchen EPINEPHrine (EPI-PEN) 0.3 mg/0.3 mL DEVI Inject into the muscle as needed.    Marland Kitchen estradiol (ESTRACE) 0.1 MG/GM vaginal cream Use a small amount around the introitus twice weekly at bed time. 42.5 g 1  . estradiol (ESTRING) 2 MG vaginal ring Place 2 mg every 3 (three) months vaginally. Insert a new ring into vagina every 3 months 1 each 3  . fenofibrate 160 MG tablet Take 160 mg by mouth daily.    . finasteride (PROSCAR) 5 MG tablet Take 2.5 mg by mouth daily.     Marland Kitchen gabapentin (NEURONTIN) 100 MG capsule Take 2 capsules (200 mg) po bid. 120 capsule 5  . hydrochlorothiazide (HYDRODIURIL) 25 MG tablet Take 25 mg by mouth daily.    . hydrOXYzine  (ATARAX/VISTARIL) 50 MG tablet Take 1 tablet by mouth 2 (two) times daily.    . hyoscyamine (LEVSIN SL) 0.125 MG SL tablet Place 1 tablet under the tongue as needed.    Marland Kitchen ibuprofen (ADVIL,MOTRIN) 800 MG tablet Take 1 tablet (800 mg total) by mouth every 8 (eight) hours as needed. 30 tablet 0  . insulin aspart (NOVOLOG) cartridge Inject into the skin 3 (three) times daily with meals. Patient has insulin pump    . levothyroxine (SYNTHROID, LEVOTHROID) 137 MCG tablet Take 137 mcg by mouth daily before breakfast.    . Linagliptin-Metformin HCl (JENTADUETO) 2.5-850 MG TABS Take 1 tablet by mouth 2 (two) times daily.    . montelukast (SINGULAIR) 10 MG tablet Take 10 mg by mouth at bedtime.     . ondansetron (ZOFRAN-ODT) 4 MG disintegrating tablet Take 1 tablet by mouth as needed.    Marland Kitchen oxyCODONE-acetaminophen (PERCOCET) 5-325 MG tablet Take 2 tablets by mouth every 4 (four) hours as needed. use only as much as needed to relieve pain 30 tablet 0  . predniSONE (DELTASONE) 1 MG tablet Alternating between 4mg  and 5mg  daily    . PRESCRIPTION MEDICATION Take 1 tablet by mouth 2 (two) times daily. Ketotifen 5mg  table[place] For mast cell problem    . ranitidine (ZANTAC) 300 MG tablet Take 300 mg by mouth daily.    Marland Kitchen topiramate (TOPAMAX) 50 MG tablet Take 50 mg by mouth 2 (two) times daily.     Marland Kitchen UNABLE TO FIND Med Name: Ketotifen 5mg  . Patient takes 1 tablet daily    . venlafaxine XR (EFFEXOR XR) 37.5 MG 24 hr capsule Take one capsule of 37.5 mg every other day and alternate this with 75 mg every other day. 45 capsule 1  . venlafaxine XR (EFFEXOR-XR) 75 MG 24 hr capsule Take 75 mg by mouth daily.    . Vitamin D, Ergocalciferol, (DRISDOL) 50000 units CAPS capsule Take 50,000 Units by mouth every Saturday.     . zolmitriptan (ZOMIG) 5 MG tablet Take 5 mg by mouth as needed for migraine.     No current facility-administered medications for this visit.      ALLERGIES: Meperidine and  Demerol  Family History  Problem Relation Age of Onset  . Breast cancer Mother        18's  . Hypertension Mother   . Thyroid disease Mother        hypothyroid  . Cancer Father  LUNG  . Thyroid disease Maternal Grandmother        hypothyroid    Social History   Socioeconomic History  . Marital status: Married    Spouse name: Not on file  . Number of children: Not on file  . Years of education: Not on file  . Highest education level: Not on file  Social Needs  . Financial resource strain: Not on file  . Food insecurity - worry: Not on file  . Food insecurity - inability: Not on file  . Transportation needs - medical: Not on file  . Transportation needs - non-medical: Not on file  Occupational History  . Not on file  Tobacco Use  . Smoking status: Never Smoker  . Smokeless tobacco: Never Used  Substance and Sexual Activity  . Alcohol use: No    Alcohol/week: 0.0 oz    Comment: social  . Drug use: No  . Sexual activity: Yes    Partners: Male    Birth control/protection: Surgical    Comment: TAH/BSO  Other Topics Concern  . Not on file  Social History Narrative  . Not on file    ROS:  Pertinent items are noted in HPI.  PHYSICAL EXAMINATION:    BP 104/70 (BP Location: Right Arm, Patient Position: Sitting, Cuff Size: Normal)   Pulse 84   Resp 16   Wt 173 lb (78.5 kg)   BMI 29.93 kg/m     General appearance: alert, cooperative and appears stated age   Pelvic: External genitalia:  no lesions              Urethra:  normal appearing urethra with no masses, tenderness or lesions              Bartholins and Skenes: normal                 Vagina: normal appearing vagina with normal color and discharge, no lesions              Cervix:  Absent.                 Bimanual Exam:  Uterus:  Absent.              Adnexa: no mass, fullness, tenderness              I placed the Estring with Surgilube and the patient was able to maintain the ring but felt it  protruding slightly.  I removed it, cleaned it, and placed it in a bio bag for her.   Chaperone was present for exam.  ASSESSMENT  Status post hysterectomy with foreshortened vagina.  Status post rectocele repair. Vaginal atrophy with tight introitus.   PLAN  Return to use of the Vagifem pv at hs x 2 weeks and then twice weekly.  Use estrogen cream twice weekly and massage into the introitus.  Follow up in one month.    An After Visit Summary was printed and given to the patient.  __15____ minutes face to face time of which over 50% was spent in counseling.

## 2017-03-09 NOTE — Patient Instructions (Signed)
Return to using the Vagifem in the vagina at night for 2 weeks and then use twice weekly.   Use the vaginal estrogen cream twice a week and massage into the vaginal opening.   I will see you back in one month.

## 2017-03-19 ENCOUNTER — Telehealth: Payer: Self-pay | Admitting: Obstetrics and Gynecology

## 2017-03-19 MED ORDER — ESTRADIOL 10 MCG VA TABS: ORAL_TABLET | VAGINAL | 1 refills | Status: DC

## 2017-03-19 NOTE — Telephone Encounter (Signed)
Patient wants to speak to a nurse about the Estring

## 2017-03-19 NOTE — Telephone Encounter (Signed)
Spoke with patient. Patient states that she was seen on 03/07/2017 and was prescribed Estring. Was seen again on 03/09/2017 and decided she did not want to proceed with Estring and would like to return to using Vagifem. Per OV note on 03/09/2017 patient is to use Vagifem pv at hs for 2 weeks and then twice weekly. Patient reports no rx was sent in. Rx for Estring discontinued. Rx for Vagifem 10 mcg place pv at hs x 2 weeks and then twice weekly #14 refill as 8 1RF sent to pharmacy on file. Patient is agreeable.  Routing to provider for final review. Patient agreeable to disposition. Will close encounter.

## 2017-04-03 DIAGNOSIS — K589 Irritable bowel syndrome without diarrhea: Secondary | ICD-10-CM | POA: Diagnosis not present

## 2017-04-03 DIAGNOSIS — D4709 Other mast cell neoplasms of uncertain behavior: Secondary | ICD-10-CM | POA: Diagnosis not present

## 2017-04-03 DIAGNOSIS — Z23 Encounter for immunization: Secondary | ICD-10-CM | POA: Diagnosis not present

## 2017-04-03 DIAGNOSIS — I38 Endocarditis, valve unspecified: Secondary | ICD-10-CM | POA: Diagnosis not present

## 2017-04-03 DIAGNOSIS — I10 Essential (primary) hypertension: Secondary | ICD-10-CM | POA: Diagnosis not present

## 2017-04-03 DIAGNOSIS — M353 Polymyalgia rheumatica: Secondary | ICD-10-CM | POA: Diagnosis not present

## 2017-04-03 DIAGNOSIS — E538 Deficiency of other specified B group vitamins: Secondary | ICD-10-CM | POA: Diagnosis not present

## 2017-04-03 DIAGNOSIS — Z0000 Encounter for general adult medical examination without abnormal findings: Secondary | ICD-10-CM | POA: Diagnosis not present

## 2017-04-03 DIAGNOSIS — E559 Vitamin D deficiency, unspecified: Secondary | ICD-10-CM | POA: Diagnosis not present

## 2017-04-03 DIAGNOSIS — E1165 Type 2 diabetes mellitus with hyperglycemia: Secondary | ICD-10-CM | POA: Diagnosis not present

## 2017-04-03 DIAGNOSIS — E039 Hypothyroidism, unspecified: Secondary | ICD-10-CM | POA: Diagnosis not present

## 2017-04-04 ENCOUNTER — Encounter: Payer: Self-pay | Admitting: Obstetrics and Gynecology

## 2017-04-04 ENCOUNTER — Ambulatory Visit (INDEPENDENT_AMBULATORY_CARE_PROVIDER_SITE_OTHER): Payer: Medicare Other | Admitting: Obstetrics and Gynecology

## 2017-04-04 ENCOUNTER — Other Ambulatory Visit: Payer: Self-pay

## 2017-04-04 VITALS — BP 122/68 | HR 80 | Ht 63.75 in | Wt 171.6 lb

## 2017-04-04 DIAGNOSIS — Z9889 Other specified postprocedural states: Secondary | ICD-10-CM

## 2017-04-04 DIAGNOSIS — N951 Menopausal and female climacteric states: Secondary | ICD-10-CM | POA: Diagnosis not present

## 2017-04-04 DIAGNOSIS — N952 Postmenopausal atrophic vaginitis: Secondary | ICD-10-CM

## 2017-04-04 NOTE — Progress Notes (Signed)
GYNECOLOGY  VISIT   HPI: 66 y.o.   Married  Caucasian  female   G2P2 with No LMP recorded. Patient has had a hysterectomy.   here for 1 month follow up.    Now using Vagifem, twice weekly.  Using the vaginal estrogen cream to the introitus.   Alternating btw Effexor 37.5 and 75 mg daily for about 3 weeks.  No problems.  Goal is to wean off.   Weaning off Prednisone.  Now of 3 mg daily.  On Gabapentin 200 mg twice daily for hot flashes.   Colonoscopy next week with Dr. Earlean Shawl.   GYNECOLOGIC HISTORY: No LMP recorded. Patient has had a hysterectomy. Contraception:  Hysterectomy Menopausal hormone therapy:  Estring Last mammogram: 02-12-17 Density B/Neg/BiRads1:TBC Last pap smear: 09-07-16 Neg; 09-30-15 Neg        OB History    Gravida Para Term Preterm AB Living   2 2       2    SAB TAB Ectopic Multiple Live Births                     Patient Active Problem List   Diagnosis Date Noted  . Postmenopausal HRT (hormone replacement therapy) 10/29/2014  . Hot flashes, menopausal 10/29/2014  . Fitting and adjustment of gastric lap band 01/14/2013  . Obese 01/14/2013  . H/O vitamin D deficiency 08/09/2012  . Unspecified hypothyroidism 08/09/2012  . Menopause 08/09/2012  . Postoperative seroma-in ventral hernia repair site 07/20/2011  . Lapband APS April 2010 07/20/2011  . Hypertension   . Mast cell disorder   . DEGENERATIVE JOINT DISEASE 02/19/2008  . SPONDYLOSIS UNSPEC SITE W/O MENTION MYELOPATHY 02/19/2008  . SINUS TARSI SYNDROME 02/19/2008  . UNEQUAL LEG LENGTH 02/19/2008    Past Medical History:  Diagnosis Date  . Abnormal Pap smear of cervix    Hx of cryotherapy to cervix in her 38s  . Abnormal uterine bleeding    history of fibroids  . Anemia   . Diabetes mellitus without complication ([place])   . DJD (degenerative joint disease)   . Fibroid   . Heart murmur    functional  . History of hypertension NO MEDS SINCE LAP GASTRIC BAND 2010 --  WT. LOSS  .  Hypertension   . Hypothyroidism   . 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  STATES SHE CARRIES EPI PEN--BUT HAS NOT HAD ANY RECENT EPISODES.  STATES MANY OF THE MEDICATIONS SHE TAKES ARE TO HELP THIS PROBLEM - INCLUDING  Ketotifen-mast cell inhibitor-not approved in [place].   . Migraine   . 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 Osteopenia   . PMR (polymyalgia rheumatica) ([facility])   . PONV (postoperative nausea and vomiting)   . Spondylosis     Past Surgical History:  Procedure Laterality Date  . ABDOMINAL HYSTERECTOMY  02/20/2011   Procedure: HYSTERECTOMY ABDOMINAL;  Surgeon: Bennetta Laos, MD;  Location: [facility];  Service: Gynecology;  Laterality: N/A;  . BREAST BIOPSY    . CERVICAL BIOPSY  W/ LOOP ELECTRODE EXCISION  1992  . CERVIX LESION DESTRUCTION    . [place]   X2  . CHOLECYSTECTOMY    . COLONOSCOPY    . DILATION AND CURETTAGE OF UTERUS     heavy menses  . HYSTEROSCOPY  06/1995   HYSTEROSCOPIC MYOMECTOMY  . HYSTEROSCOPY  06/1998   D&C, HYSTEROSCOPY  . KNEE SURGERY     X2 right knee  . LAPAROSCOPIC GASTRIC BANDING  08-03-2008   [facility] APS SYSTEM  . LESION REMOVAL N/A 09/02/2012   Procedure: EXCISION VAGINAL LESION;  Surgeon: Terrance Mass, MD;  Location: [facility];  Service: Gynecology;  Laterality: N/A;  cpt 57100  one hour  . PUBOVAGINAL SLING  02/20/2011   Procedure: Gaynelle Arabian;  Surgeon: Ailene Rud, MD;  Location: [facility];  Service: Urology;  Laterality: N/A;  . RECTOCELE REPAIR N/A 12/19/2016   Procedure: POSTERIOR REPAIR (RECTOCELE);  Surgeon: Nunzio Cobbs, MD;  Location: [facility];  Service: Gynecology;  Laterality: N/A;  1.25 hours  . SALPINGOOPHORECTOMY  02/20/2011   Procedure: SALPINGO OOPHERECTOMY;  Surgeon: Bennetta Laos, MD;  Location: [facility];  Service: Gynecology;   Laterality: Bilateral;  . UPPER GASTROINTESTINAL ENDOSCOPY  06/2011  . VENTRAL HERNIA REPAIR  05/24/2011   Procedure: LAPAROSCOPIC VENTRAL HERNIA;  Surgeon: Pedro Earls, MD;  Location: [facility];  Service: General;  Laterality: N/A;    Current Outpatient Medications  Medication Sig Dispense Refill  . cetirizine (ZYRTEC) 10 MG tablet Take 10 mg by mouth daily.     . clonazePAM (KLONOPIN) 1 MG tablet Take 1 mg by mouth at bedtime as needed for anxiety.    Marland Kitchen EPINEPHrine (EPI-PEN) 0.3 mg/0.3 mL DEVI Inject into the muscle as needed.    Marland Kitchen estradiol (ESTRACE) 0.1 MG/GM vaginal cream Use a small amount around the introitus twice weekly at bed time. 42.5 g 1  . Estradiol (VAGIFEM) 10 MCG TABS vaginal tablet Vagifem pv at hs x 2 weeks and then twice weekly. 14 tablet 1  . finasteride (PROSCAR) 5 MG tablet Take 2.5 mg by mouth daily.     Marland Kitchen gabapentin (NEURONTIN) 100 MG capsule Take 2 capsules (200 mg) po bid. 120 capsule 5  . hydrochlorothiazide (HYDRODIURIL) 25 MG tablet Take 25 mg by mouth daily.    . hydrOXYzine (ATARAX/VISTARIL) 50 MG tablet Take 1 tablet by mouth 2 (two) times daily.    . hyoscyamine (LEVSIN SL) 0.125 MG SL tablet Place 1 tablet under the tongue as needed.    Marland Kitchen ibuprofen (ADVIL,MOTRIN) 800 MG tablet Take 1 tablet (800 mg total) by mouth every 8 (eight) hours as needed. 30 tablet 0  . insulin aspart (NOVOLOG) cartridge Inject into the skin 3 (three) times daily with meals. Patient has insulin pump    . levothyroxine (SYNTHROID, LEVOTHROID) 137 MCG tablet Take 137 mcg by mouth daily before breakfast.    . Linagliptin-Metformin HCl (JENTADUETO) 2.5-850 MG TABS Take 1 tablet by mouth 2 (two) times daily.    . montelukast (SINGULAIR) 10 MG tablet Take 10 mg by mouth at bedtime.     . ondansetron (ZOFRAN-ODT) 4 MG disintegrating tablet Take 1 tablet by mouth as needed.    Marland Kitchen oxyCODONE-acetaminophen (PERCOCET) 5-325 MG tablet Take 2 tablets by mouth every 4 (four) hours as needed. use  only as much as needed to relieve pain 30 tablet 0  . predniSONE (DELTASONE) 1 MG tablet Takes 3mg  daily    . PRESCRIPTION MEDICATION Take 1 tablet by mouth 2 (two) times daily. Ketotifen 5mg  [place] For mast cell problem    . ranitidine (ZANTAC) 300 MG tablet Take 300 mg by mouth daily.    Marland Kitchen topiramate (TOPAMAX) 50 MG tablet Take 50 mg by mouth 2 (two) times daily.     Marland Kitchen  UNABLE TO FIND Med Name: Ketotifen 5mg  . Patient takes 1 tablet daily    . venlafaxine XR (EFFEXOR XR) 37.5 MG 24 hr capsule Take one capsule of 37.5 mg every other day and alternate this with 75 mg every other day. 45 capsule 1  . venlafaxine XR (EFFEXOR-XR) 75 MG 24 hr capsule Alternates 75mg  every other day with 37.5mg     . Vitamin D, Ergocalciferol, (DRISDOL) 50000 units CAPS capsule Take 50,000 Units by mouth every Saturday.     . zolmitriptan (ZOMIG) 5 MG tablet Take 5 mg by mouth as needed for migraine.    Marland Kitchen ESTRING 2 MG vaginal ring      No current facility-administered medications for this visit.      ALLERGIES: Meperidine and Demerol  Family History  Problem Relation Age of Onset  . Breast cancer Mother        16's  . Hypertension Mother   . Thyroid disease Mother        hypothyroid  . Cancer Father        LUNG  . Thyroid disease Maternal Grandmother        hypothyroid    Social History   Socioeconomic History  . Marital status: Married    Spouse name: Not on file  . Number of children: Not on file  . Years of education: Not on file  . Highest education level: Not on file  Social Needs  . Financial resource strain: Not on file  . Food insecurity - worry: Not on file  . Food insecurity - inability: Not on file  . Transportation needs - medical: Not on file  . Transportation needs - non-medical: Not on file  Occupational History  . Not on file  Tobacco Use  . Smoking status: Never Smoker  . Smokeless tobacco: Never Used  Substance and Sexual Activity  . Alcohol  use: No    Alcohol/week: 0.0 oz    Comment: social  . Drug use: No  . Sexual activity: Yes    Partners: Male    Birth control/protection: Surgical    Comment: TAH/BSO  Other Topics Concern  . Not on file  Social History Narrative  . Not on file    ROS:  Pertinent items are noted in HPI.  PHYSICAL EXAMINATION:    BP 122/68 (BP Location: Right Arm, Patient Position: Sitting, Cuff Size: Normal)   Pulse 80   Ht 5' 3.75" (1.619 m)   Wt 171 lb 9.6 oz (77.8 kg)   BMI 29.69 kg/m     General appearance: alert, cooperative and appears stated age  Pelvic: External genitalia:  no lesions              Urethra:  normal appearing urethra with no masses, tenderness or lesions              Bartholins and Skenes: normal                 Vagina: normal appearing vagina with normal color and discharge, no lesions              Cervix:  Absent.                Bimanual Exam:  Uterus: absent.               Adnexa: no mass, fullness, tenderness            Chaperone was present for exam.  ASSESSMENT  Status post rectocele repair.  Menopausal symptoms.  Vaginal atrophy.   PLAN  Decrease to Effexor 37.5 mg daily for 6 weeks. Then reduce to Effexor 37.5 mg every other day, and then stop.  Continue Vagifem twice weekly and use vaginal estrogen cream to introitus twice weekly.  Ok to return to all normal activities including sexual activity.  Follow up for annual exam and prn.    An After Visit Summary was printed and given to the patient.  __15____ minutes face to face time of which over 50% was spent in counseling.

## 2017-04-10 ENCOUNTER — Other Ambulatory Visit: Payer: Self-pay | Admitting: Plastic Surgery

## 2017-04-10 DIAGNOSIS — L820 Inflamed seborrheic keratosis: Secondary | ICD-10-CM | POA: Diagnosis not present

## 2017-04-12 ENCOUNTER — Encounter: Payer: Self-pay | Admitting: Internal Medicine

## 2017-04-12 ENCOUNTER — Encounter (INDEPENDENT_AMBULATORY_CARE_PROVIDER_SITE_OTHER): Payer: Self-pay

## 2017-04-12 ENCOUNTER — Ambulatory Visit (INDEPENDENT_AMBULATORY_CARE_PROVIDER_SITE_OTHER): Payer: Medicare Other | Admitting: Internal Medicine

## 2017-04-12 VITALS — BP 108/72 | HR 78 | Ht 63.75 in | Wt 173.0 lb

## 2017-04-12 DIAGNOSIS — R011 Cardiac murmur, unspecified: Secondary | ICD-10-CM | POA: Diagnosis not present

## 2017-04-12 NOTE — Patient Instructions (Signed)
Dr. Debara Pickett recommends that you follow up AS NEEDED. Please call our office if your symptoms change or worsen.   ** info below on echocardiography testing   Echocardiogram An echocardiogram, or echocardiography, uses sound waves (ultrasound) to produce an image of your heart. The echocardiogram is simple, painless, obtained within a short period of time, and offers valuable information to your health care provider. The images from an echocardiogram can provide information such as:  Evidence of coronary artery disease (CAD).  Heart size.  Heart muscle function.  Heart valve function.  Aneurysm detection.  Evidence of a past heart attack.  Fluid buildup around the heart.  Heart muscle thickening.  Assess heart valve function.  Tell a health care provider about:  Any allergies you have.  All medicines you are taking, including vitamins, herbs, eye drops, creams, and over-the-counter medicines.  Any problems you or family members have had with anesthetic medicines.  Any blood disorders you have.  Any surgeries you have had.  Any medical conditions you have.  Whether you are pregnant or may be pregnant. What happens before the procedure? No special preparation is needed. Eat and drink normally. What happens during the procedure?  In order to produce an image of your heart, gel will be applied to your chest and a wand-like tool (transducer) will be moved over your chest. The gel will help transmit the sound waves from the transducer. The sound waves will harmlessly bounce off your heart to allow the heart images to be captured in real-time motion. These images will then be recorded.  You may need an IV to receive a medicine that improves the quality of the pictures. What happens after the procedure? You may return to your normal schedule including diet, activities, and medicines, unless your health care provider tells you otherwise. This information is not intended to  replace advice given to you by your health care provider. Make sure you discuss any questions you have with your health care provider. Document Released: 04/14/2000 Document Revised: 12/04/2015 Document Reviewed: 12/23/2012 Elsevier Interactive Patient Education  2017 [employer].

## 2017-04-14 ENCOUNTER — Encounter: Payer: Self-pay | Admitting: Internal Medicine

## 2017-04-14 DIAGNOSIS — R011 Cardiac murmur, unspecified: Secondary | ICD-10-CM | POA: Insufficient documentation

## 2017-04-14 NOTE — Progress Notes (Signed)
OFFICE CONSULT NOTE  Chief Complaint:  Murmur  Primary Care Physician: Josetta Huddle, MD  HPI:  Gabrielle Lynch is a 66 y.o. female who is being seen today for the evaluation of murmur at the request of Josetta Huddle, MD.  This is a pleasant 66 year old female with a history of type 2 diabetes, anemia, degenerative joint disease, fibroids, abnormal uterine bleeding, hypertension, hypothyroidism and PMR secondary to an unusual infection with Campylobacter jejuni which was acquired after a trip to [place].  She has a history of soft systolic murmur thought to be aortic sclerosis however recently her murmur apparently was louder.  She was referred for evaluation of her aortic murmur.  She is completely asymptomatic.  She is physically active and denies any chest pain or worsening shortness of breath.  She is able to walk up a flight of stairs without any limitations.  No history of significant valvular heart disease in the family.  PMHx:  Past Medical History:  Diagnosis Date  . Abnormal Pap smear of cervix    Hx of cryotherapy to cervix in her 20s  . Abnormal uterine bleeding    history of fibroids  . Anemia   . Diabetes mellitus without complication ([place])   . DJD (degenerative joint disease)   . Fibroid   . Heart murmur    functional  . History of hypertension NO MEDS SINCE LAP GASTRIC BAND 2010 --  WT. LOSS  . Hypertension   . Hypothyroidism   . 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  STATES SHE CARRIES EPI PEN--BUT HAS NOT HAD ANY RECENT EPISODES.  STATES MANY OF THE MEDICATIONS SHE TAKES ARE TO HELP THIS PROBLEM - INCLUDING  Ketotifen-mast cell inhibitor-not approved in [place].   . Migraine   . 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 Osteopenia   . PMR (polymyalgia rheumatica) (HCC)   . PONV (postoperative nausea and vomiting)   . Spondylosis     Past  Surgical History:  Procedure Laterality Date  . ABDOMINAL HYSTERECTOMY  02/20/2011   Procedure: HYSTERECTOMY ABDOMINAL;  Surgeon: Bennetta Laos, MD;  Location: [facility];  Service: Gynecology;  Laterality: N/A;  . BREAST BIOPSY    . CERVICAL BIOPSY  W/ LOOP ELECTRODE EXCISION  1992  . CERVIX LESION DESTRUCTION    . [place]   X2  . CHOLECYSTECTOMY    . COLONOSCOPY    . DILATION AND CURETTAGE OF UTERUS     heavy menses  . HYSTEROSCOPY  06/1995   HYSTEROSCOPIC MYOMECTOMY  . HYSTEROSCOPY  06/1998   D&C, HYSTEROSCOPY  . KNEE SURGERY     X2 right knee  . LAPAROSCOPIC GASTRIC BANDING  08-03-2008   [facility] APS SYSTEM  . LESION REMOVAL N/A 09/02/2012   Procedure: EXCISION VAGINAL LESION;  Surgeon: Terrance Mass, MD;  Location: [facility];  Service: Gynecology;  Laterality: N/A;  cpt 57100  one hour  . PUBOVAGINAL SLING  02/20/2011   Procedure: Gaynelle Arabian;  Surgeon: Ailene Rud, MD;  Location: [facility];  Service: Urology;  Laterality: N/A;  . RECTOCELE REPAIR N/A 12/19/2016   Procedure: POSTERIOR REPAIR (RECTOCELE);  Surgeon: Nunzio Cobbs, MD;  Location: [facility];  Service: Gynecology;  Laterality: N/A;  1.25 hours  . SALPINGOOPHORECTOMY  02/20/2011   Procedure: SALPINGO OOPHERECTOMY;  Surgeon: Quillian Quince  Harle Stanford, MD;  Location: [facility];  Service: Gynecology;  Laterality: Bilateral;  . UPPER GASTROINTESTINAL ENDOSCOPY  06/2011  . VENTRAL HERNIA REPAIR  05/24/2011   Procedure: LAPAROSCOPIC VENTRAL HERNIA;  Surgeon: Pedro Earls, MD;  Location: [facility];  Service: General;  Laterality: N/A;    FAMHx:  Family History  Problem Relation Age of Onset  . Breast cancer Mother        2's  . Hypertension Mother   . Thyroid disease Mother        hypothyroid  . Cancer Father        LUNG  . Thyroid disease Maternal Grandmother        hypothyroid    SOCHx:   reports that  has never smoked. she has never used smokeless  tobacco. She reports that she does not drink alcohol or use drugs.  ALLERGIES:  Allergies  Allergen Reactions  . Meperidine Other (See Comments)  . Demerol Nausea Only    ROS: Pertinent items noted in HPI and remainder of comprehensive ROS otherwise negative.  HOME MEDS: Current Outpatient Medications on File Prior to Visit  Medication Sig Dispense Refill  . cetirizine (ZYRTEC) 10 MG tablet Take 10 mg by mouth daily.     . clonazePAM (KLONOPIN) 1 MG tablet Take 1 mg by mouth at bedtime as needed for anxiety.    Marland Kitchen EPINEPHrine (EPI-PEN) 0.3 mg/0.3 mL DEVI Inject into the muscle as needed.    Marland Kitchen estradiol (ESTRACE) 0.1 MG/GM vaginal cream Use a small amount around the introitus twice weekly at bed time. 42.5 g 1  . Estradiol (VAGIFEM) 10 MCG TABS vaginal tablet Vagifem pv at hs x 2 weeks and then twice weekly. 14 tablet 1  . finasteride (PROSCAR) 5 MG tablet Take 2.5 mg by mouth daily.     Marland Kitchen gabapentin (NEURONTIN) 100 MG capsule Take 2 capsules (200 mg) po bid. 120 capsule 5  . hydrOXYzine (ATARAX/VISTARIL) 50 MG tablet Take 1 tablet by mouth 2 (two) times daily.    . hyoscyamine (LEVSIN SL) 0.125 MG SL tablet Place 1 tablet under the tongue as needed.    . insulin aspart (NOVOLOG) cartridge Inject into the skin 3 (three) times daily with meals. Patient has insulin pump    . levothyroxine (SYNTHROID, LEVOTHROID) 137 MCG tablet Take 137 mcg by mouth daily before breakfast.    . Linagliptin-Metformin HCl (JENTADUETO) 2.5-850 MG TABS Take 1 tablet by mouth 2 (two) times daily.    . montelukast (SINGULAIR) 10 MG tablet Take 10 mg by mouth at bedtime.     . ondansetron (ZOFRAN-ODT) 4 MG disintegrating tablet Take 1 tablet by mouth as needed.    . predniSONE (DELTASONE) 1 MG tablet Takes 3mg  daily    . PRESCRIPTION MEDICATION Take 1 tablet by mouth 2 (two) times daily. Ketotifen 5mg  tablet[place]. For mast cell problem    . ranitidine (ZANTAC) 300 MG tablet Take 300 mg by  mouth daily.    Marland Kitchen topiramate (TOPAMAX) 50 MG tablet Take 50 mg by mouth 2 (two) times daily.     Marland Kitchen venlafaxine XR (EFFEXOR XR) 37.5 MG 24 hr capsule Take one capsule of 37.5 mg every other day and alternate this with 75 mg every other day. 45 capsule 1  . Vitamin D, Ergocalciferol, (DRISDOL) 50000 units CAPS capsule Take 50,000 Units by mouth every Saturday.     . zolmitriptan (ZOMIG) 5 MG tablet Take 5 mg by mouth as needed  for migraine.     No current facility-administered medications on file prior to visit.     LABS/IMAGING: No results found for this or any previous visit (from the past 48 hour(s)). No results found.  LIPID PANEL: No results found for: CHOL, TRIG, HDL, CHOLHDL, VLDL, LDLCALC, LDLDIRECT  WEIGHTS: Wt Readings from Last 3 Encounters:  04/12/17 173 lb (78.5 kg)  04/04/17 171 lb 9.6 oz (77.8 kg)  03/09/17 173 lb (78.5 kg)    VITALS: BP 108/72   Pulse 78   Ht 5' 3.75" (1.619 m)   Wt 173 lb (78.5 kg)   BMI 29.93 kg/m   EXAM: General appearance: alert and no distress Neck: no carotid bruit, no JVD and thyroid not enlarged, symmetric, no tenderness/mass/nodules Lungs: clear to auscultation bilaterally Heart: regular rate and rhythm, S1, S2 normal and systolic murmur: early systolic 1/6, blowing at 2nd right intercostal space Abdomen: soft, non-tender; bowel sounds normal; no masses,  no organomegaly Extremities: extremities normal, atraumatic, no cyanosis or edema Pulses: 2+ and symmetric Skin: Skin color, texture, turgor normal. No rashes or lesions Neurologic: Grossly normal Psych: Pleasant  EKG: Normal sinus rhythm, left anterior fascicular block at 78- personally reviewed  ASSESSMENT: 1. Innocent murmur  PLAN: 1.   Mrs. Notch has a very soft systolic murmur which is likely a flow murmur.  Was documented by her PCP to louder however may be related to multiple reasons including anemia, hypothyroidism or other etiologies that can cause this murmur to  wax and wane.  I do not see a clear indication for echocardiography given the soft features of the murmur.  I recommend following it clinically and if it continues to grow louder an echocardiogram could be considered.  At this point she is completely asymptomatic.  Thanks again for the kind referral.  Pixie Casino, MD, FACC, [place] Director of the [facility] Attending Cardiologist  Direct Dial: [phone]  Fax: [phone]  Website:  [email or web address] 04/14/2017, 3:25 PM

## 2017-05-01 DIAGNOSIS — R7989 Other specified abnormal findings of blood chemistry: Secondary | ICD-10-CM

## 2017-05-01 HISTORY — DX: Other specified abnormal findings of blood chemistry: R79.89

## 2017-05-03 DIAGNOSIS — Z4681 Encounter for fitting and adjustment of insulin pump: Secondary | ICD-10-CM | POA: Diagnosis not present

## 2017-05-03 DIAGNOSIS — N183 Chronic kidney disease, stage 3 (moderate): Secondary | ICD-10-CM | POA: Diagnosis not present

## 2017-05-03 DIAGNOSIS — Z794 Long term (current) use of insulin: Secondary | ICD-10-CM | POA: Diagnosis not present

## 2017-05-03 DIAGNOSIS — E1129 Type 2 diabetes mellitus with other diabetic kidney complication: Secondary | ICD-10-CM | POA: Diagnosis not present

## 2017-05-03 DIAGNOSIS — Z6829 Body mass index (BMI) 29.0-29.9, adult: Secondary | ICD-10-CM | POA: Diagnosis not present

## 2017-05-03 DIAGNOSIS — M353 Polymyalgia rheumatica: Secondary | ICD-10-CM | POA: Diagnosis not present

## 2017-05-07 DIAGNOSIS — M2550 Pain in unspecified joint: Secondary | ICD-10-CM | POA: Diagnosis not present

## 2017-05-07 DIAGNOSIS — M353 Polymyalgia rheumatica: Secondary | ICD-10-CM | POA: Diagnosis not present

## 2017-05-07 DIAGNOSIS — E663 Overweight: Secondary | ICD-10-CM | POA: Diagnosis not present

## 2017-05-07 DIAGNOSIS — Z7952 Long term (current) use of systemic steroids: Secondary | ICD-10-CM | POA: Diagnosis not present

## 2017-05-07 DIAGNOSIS — M316 Other giant cell arteritis: Secondary | ICD-10-CM | POA: Diagnosis not present

## 2017-05-07 DIAGNOSIS — Z6829 Body mass index (BMI) 29.0-29.9, adult: Secondary | ICD-10-CM | POA: Diagnosis not present

## 2017-05-08 DIAGNOSIS — D8940 Mast cell activation, unspecified: Secondary | ICD-10-CM | POA: Diagnosis not present

## 2017-05-08 DIAGNOSIS — R197 Diarrhea, unspecified: Secondary | ICD-10-CM | POA: Diagnosis not present

## 2017-05-23 DIAGNOSIS — Z9884 Bariatric surgery status: Secondary | ICD-10-CM | POA: Diagnosis not present

## 2017-05-24 DIAGNOSIS — Z1211 Encounter for screening for malignant neoplasm of colon: Secondary | ICD-10-CM | POA: Diagnosis not present

## 2017-05-24 DIAGNOSIS — K5730 Diverticulosis of large intestine without perforation or abscess without bleeding: Secondary | ICD-10-CM | POA: Diagnosis not present

## 2017-05-24 DIAGNOSIS — R197 Diarrhea, unspecified: Secondary | ICD-10-CM | POA: Diagnosis not present

## 2017-05-24 DIAGNOSIS — K529 Noninfective gastroenteritis and colitis, unspecified: Secondary | ICD-10-CM | POA: Diagnosis not present

## 2017-06-07 DIAGNOSIS — H43812 Vitreous degeneration, left eye: Secondary | ICD-10-CM | POA: Diagnosis not present

## 2017-06-07 DIAGNOSIS — H25813 Combined forms of age-related cataract, bilateral: Secondary | ICD-10-CM | POA: Diagnosis not present

## 2017-06-07 DIAGNOSIS — H40013 Open angle with borderline findings, low risk, bilateral: Secondary | ICD-10-CM | POA: Diagnosis not present

## 2017-06-13 ENCOUNTER — Other Ambulatory Visit: Payer: Self-pay | Admitting: Obstetrics and Gynecology

## 2017-06-13 DIAGNOSIS — Z7189 Other specified counseling: Secondary | ICD-10-CM | POA: Diagnosis not present

## 2017-06-13 DIAGNOSIS — F5109 Other insomnia not due to a substance or known physiological condition: Secondary | ICD-10-CM | POA: Diagnosis not present

## 2017-06-13 DIAGNOSIS — R05 Cough: Secondary | ICD-10-CM | POA: Diagnosis not present

## 2017-06-14 DIAGNOSIS — Z6829 Body mass index (BMI) 29.0-29.9, adult: Secondary | ICD-10-CM | POA: Diagnosis not present

## 2017-06-14 DIAGNOSIS — Z794 Long term (current) use of insulin: Secondary | ICD-10-CM | POA: Diagnosis not present

## 2017-06-14 DIAGNOSIS — Z4681 Encounter for fitting and adjustment of insulin pump: Secondary | ICD-10-CM | POA: Diagnosis not present

## 2017-06-14 DIAGNOSIS — I10 Essential (primary) hypertension: Secondary | ICD-10-CM | POA: Diagnosis not present

## 2017-06-14 DIAGNOSIS — E1129 Type 2 diabetes mellitus with other diabetic kidney complication: Secondary | ICD-10-CM | POA: Diagnosis not present

## 2017-06-14 DIAGNOSIS — N183 Chronic kidney disease, stage 3 (moderate): Secondary | ICD-10-CM | POA: Diagnosis not present

## 2017-06-14 NOTE — Telephone Encounter (Signed)
Medication refill request:gabapentin  Last AEX:  09/06/16 BS  Next AEX: 09/13/17  Last MMG (if hormonal medication request): 02/12/17 BIRADS 1 negative  Refill authorized: 12/13/16 #120, 5RF. Today, please advise.

## 2017-06-15 ENCOUNTER — Ambulatory Visit (INDEPENDENT_AMBULATORY_CARE_PROVIDER_SITE_OTHER): Payer: Medicare Other | Admitting: Psychology

## 2017-06-15 DIAGNOSIS — F3489 Other specified persistent mood disorders: Secondary | ICD-10-CM

## 2017-06-29 DIAGNOSIS — S3219XA Other fracture of sacrum, initial encounter for closed fracture: Secondary | ICD-10-CM

## 2017-06-29 HISTORY — DX: Other fracture of sacrum, initial encounter for closed fracture: S32.19XA

## 2017-07-16 ENCOUNTER — Other Ambulatory Visit: Payer: Self-pay | Admitting: Orthopedic Surgery

## 2017-07-16 DIAGNOSIS — M84350A Stress fracture, pelvis, initial encounter for fracture: Secondary | ICD-10-CM | POA: Diagnosis not present

## 2017-07-16 DIAGNOSIS — M8430XA Stress fracture, unspecified site, initial encounter for fracture: Secondary | ICD-10-CM

## 2017-07-18 ENCOUNTER — Ambulatory Visit: Payer: BLUE CROSS/BLUE SHIELD | Admitting: Psychology

## 2017-07-19 DIAGNOSIS — H43812 Vitreous degeneration, left eye: Secondary | ICD-10-CM | POA: Diagnosis not present

## 2017-07-19 DIAGNOSIS — H40013 Open angle with borderline findings, low risk, bilateral: Secondary | ICD-10-CM | POA: Diagnosis not present

## 2017-07-19 DIAGNOSIS — H25813 Combined forms of age-related cataract, bilateral: Secondary | ICD-10-CM | POA: Diagnosis not present

## 2017-07-21 ENCOUNTER — Other Ambulatory Visit: Payer: Self-pay

## 2017-08-07 ENCOUNTER — Ambulatory Visit
Admission: RE | Admit: 2017-08-07 | Discharge: 2017-08-07 | Disposition: A | Discharge status: Home / Self Care | Payer: Medicare Other | Source: Ambulatory Visit | Attending: Orthopedic Surgery | Admitting: Orthopedic Surgery

## 2017-08-07 DIAGNOSIS — M5136 Other intervertebral disc degeneration, lumbar region: Secondary | ICD-10-CM | POA: Diagnosis not present

## 2017-08-07 DIAGNOSIS — M48061 Spinal stenosis, lumbar region without neurogenic claudication: Secondary | ICD-10-CM | POA: Diagnosis not present

## 2017-08-07 DIAGNOSIS — M5126 Other intervertebral disc displacement, lumbar region: Secondary | ICD-10-CM | POA: Diagnosis not present

## 2017-08-07 DIAGNOSIS — M8430XA Stress fracture, unspecified site, initial encounter for fracture: Secondary | ICD-10-CM

## 2017-08-10 DIAGNOSIS — M810 Age-related osteoporosis without current pathological fracture: Secondary | ICD-10-CM | POA: Diagnosis not present

## 2017-08-10 DIAGNOSIS — E559 Vitamin D deficiency, unspecified: Secondary | ICD-10-CM | POA: Diagnosis not present

## 2017-08-10 DIAGNOSIS — R5383 Other fatigue: Secondary | ICD-10-CM | POA: Diagnosis not present

## 2017-08-13 ENCOUNTER — Other Ambulatory Visit: Payer: Self-pay | Admitting: Obstetrics and Gynecology

## 2017-08-13 NOTE — Telephone Encounter (Signed)
Medication refill request: Gabapentin 100mg  #120 Last AEX:  09-06-16 Next AEX: 09-13-17 Last MMG (if hormonal medication request): 02-13-17 [id number] Refill authorized: Please advise

## 2017-08-14 DIAGNOSIS — Z780 Asymptomatic menopausal state: Secondary | ICD-10-CM | POA: Diagnosis not present

## 2017-08-14 DIAGNOSIS — M8589 Other specified disorders of bone density and structure, multiple sites: Secondary | ICD-10-CM | POA: Diagnosis not present

## 2017-08-15 DIAGNOSIS — E038 Other specified hypothyroidism: Secondary | ICD-10-CM | POA: Diagnosis not present

## 2017-08-15 DIAGNOSIS — E1129 Type 2 diabetes mellitus with other diabetic kidney complication: Secondary | ICD-10-CM | POA: Diagnosis not present

## 2017-08-15 DIAGNOSIS — M353 Polymyalgia rheumatica: Secondary | ICD-10-CM | POA: Diagnosis not present

## 2017-08-15 DIAGNOSIS — E559 Vitamin D deficiency, unspecified: Secondary | ICD-10-CM | POA: Diagnosis not present

## 2017-08-15 DIAGNOSIS — E7849 Other hyperlipidemia: Secondary | ICD-10-CM | POA: Diagnosis not present

## 2017-08-15 DIAGNOSIS — Z1389 Encounter for screening for other disorder: Secondary | ICD-10-CM | POA: Diagnosis not present

## 2017-08-15 DIAGNOSIS — Z6830 Body mass index (BMI) 30.0-30.9, adult: Secondary | ICD-10-CM | POA: Diagnosis not present

## 2017-08-15 DIAGNOSIS — Z794 Long term (current) use of insulin: Secondary | ICD-10-CM | POA: Diagnosis not present

## 2017-08-15 DIAGNOSIS — H43812 Vitreous degeneration, left eye: Secondary | ICD-10-CM | POA: Diagnosis not present

## 2017-08-15 DIAGNOSIS — Z9884 Bariatric surgery status: Secondary | ICD-10-CM | POA: Diagnosis not present

## 2017-08-15 DIAGNOSIS — I10 Essential (primary) hypertension: Secondary | ICD-10-CM | POA: Diagnosis not present

## 2017-08-15 DIAGNOSIS — N183 Chronic kidney disease, stage 3 (moderate): Secondary | ICD-10-CM | POA: Diagnosis not present

## 2017-08-16 DIAGNOSIS — E559 Vitamin D deficiency, unspecified: Secondary | ICD-10-CM | POA: Diagnosis not present

## 2017-08-16 DIAGNOSIS — M810 Age-related osteoporosis without current pathological fracture: Secondary | ICD-10-CM | POA: Diagnosis not present

## 2017-08-16 DIAGNOSIS — M8580 Other specified disorders of bone density and structure, unspecified site: Secondary | ICD-10-CM | POA: Diagnosis not present

## 2017-08-16 DIAGNOSIS — R5383 Other fatigue: Secondary | ICD-10-CM | POA: Diagnosis not present

## 2017-09-13 ENCOUNTER — Ambulatory Visit: Payer: Medicare Other | Admitting: Obstetrics and Gynecology

## 2017-09-18 DIAGNOSIS — Z0000 Encounter for general adult medical examination without abnormal findings: Secondary | ICD-10-CM | POA: Diagnosis not present

## 2017-09-18 DIAGNOSIS — E559 Vitamin D deficiency, unspecified: Secondary | ICD-10-CM | POA: Diagnosis not present

## 2017-09-18 DIAGNOSIS — E039 Hypothyroidism, unspecified: Secondary | ICD-10-CM | POA: Diagnosis not present

## 2017-09-20 DIAGNOSIS — Q258 Other congenital malformations of other great arteries: Secondary | ICD-10-CM | POA: Diagnosis not present

## 2017-09-20 DIAGNOSIS — I712 Thoracic aortic aneurysm, without rupture: Secondary | ICD-10-CM | POA: Diagnosis not present

## 2017-09-27 ENCOUNTER — Other Ambulatory Visit: Payer: Self-pay | Admitting: Obstetrics and Gynecology

## 2017-09-27 NOTE — Telephone Encounter (Signed)
Medication refill request: Yuvafem  Last AEX:  09-06-16  Next AEX: 10-22-17  Last MMG (if hormonal medication request): 02-12-17 WNL  Refill authorized: please advise

## 2017-09-28 ENCOUNTER — Other Ambulatory Visit: Payer: Self-pay | Admitting: Obstetrics and Gynecology

## 2017-09-28 NOTE — Telephone Encounter (Signed)
Medication refill request: gabapentin  Last AEX:  09-06-16  Next AEX: 10-22-17  Last MMG (if hormonal medication request): 02-12-17 WNL  Refill authorized: please advise

## 2017-10-08 DIAGNOSIS — E538 Deficiency of other specified B group vitamins: Secondary | ICD-10-CM | POA: Diagnosis not present

## 2017-10-08 DIAGNOSIS — R079 Chest pain, unspecified: Secondary | ICD-10-CM | POA: Diagnosis not present

## 2017-10-08 DIAGNOSIS — D649 Anemia, unspecified: Secondary | ICD-10-CM | POA: Diagnosis not present

## 2017-10-08 DIAGNOSIS — G43109 Migraine with aura, not intractable, without status migrainosus: Secondary | ICD-10-CM | POA: Diagnosis not present

## 2017-10-08 DIAGNOSIS — F5109 Other insomnia not due to a substance or known physiological condition: Secondary | ICD-10-CM | POA: Diagnosis not present

## 2017-10-08 DIAGNOSIS — E7800 Pure hypercholesterolemia, unspecified: Secondary | ICD-10-CM | POA: Diagnosis not present

## 2017-10-09 DIAGNOSIS — E7800 Pure hypercholesterolemia, unspecified: Secondary | ICD-10-CM | POA: Diagnosis not present

## 2017-10-09 DIAGNOSIS — Z6831 Body mass index (BMI) 31.0-31.9, adult: Secondary | ICD-10-CM | POA: Diagnosis not present

## 2017-10-09 DIAGNOSIS — M316 Other giant cell arteritis: Secondary | ICD-10-CM | POA: Diagnosis not present

## 2017-10-09 DIAGNOSIS — M353 Polymyalgia rheumatica: Secondary | ICD-10-CM | POA: Diagnosis not present

## 2017-10-09 DIAGNOSIS — Z7952 Long term (current) use of systemic steroids: Secondary | ICD-10-CM | POA: Diagnosis not present

## 2017-10-09 DIAGNOSIS — E669 Obesity, unspecified: Secondary | ICD-10-CM | POA: Diagnosis not present

## 2017-10-09 DIAGNOSIS — M2550 Pain in unspecified joint: Secondary | ICD-10-CM | POA: Diagnosis not present

## 2017-10-10 DIAGNOSIS — Z4651 Encounter for fitting and adjustment of gastric lap band: Secondary | ICD-10-CM | POA: Diagnosis not present

## 2017-10-11 DIAGNOSIS — Z4681 Encounter for fitting and adjustment of insulin pump: Secondary | ICD-10-CM | POA: Diagnosis not present

## 2017-10-11 DIAGNOSIS — Z794 Long term (current) use of insulin: Secondary | ICD-10-CM | POA: Diagnosis not present

## 2017-10-11 DIAGNOSIS — E1129 Type 2 diabetes mellitus with other diabetic kidney complication: Secondary | ICD-10-CM | POA: Diagnosis not present

## 2017-10-11 DIAGNOSIS — M84350D Stress fracture, pelvis, subsequent encounter for fracture with routine healing: Secondary | ICD-10-CM | POA: Diagnosis not present

## 2017-10-11 DIAGNOSIS — I10 Essential (primary) hypertension: Secondary | ICD-10-CM | POA: Diagnosis not present

## 2017-10-11 DIAGNOSIS — Z6832 Body mass index (BMI) 32.0-32.9, adult: Secondary | ICD-10-CM | POA: Diagnosis not present

## 2017-10-11 DIAGNOSIS — N183 Chronic kidney disease, stage 3 (moderate): Secondary | ICD-10-CM | POA: Diagnosis not present

## 2017-10-22 ENCOUNTER — Encounter: Payer: Self-pay | Admitting: Obstetrics and Gynecology

## 2017-10-22 ENCOUNTER — Other Ambulatory Visit: Payer: Self-pay

## 2017-10-22 ENCOUNTER — Ambulatory Visit (INDEPENDENT_AMBULATORY_CARE_PROVIDER_SITE_OTHER): Payer: Medicare Other | Admitting: Obstetrics and Gynecology

## 2017-10-22 VITALS — BP 104/62 | HR 84 | Resp 18 | Ht 63.5 in | Wt 184.0 lb

## 2017-10-22 DIAGNOSIS — D2272 Melanocytic nevi of left lower limb, including hip: Secondary | ICD-10-CM | POA: Diagnosis not present

## 2017-10-22 DIAGNOSIS — L820 Inflamed seborrheic keratosis: Secondary | ICD-10-CM | POA: Diagnosis not present

## 2017-10-22 DIAGNOSIS — D1801 Hemangioma of skin and subcutaneous tissue: Secondary | ICD-10-CM | POA: Diagnosis not present

## 2017-10-22 DIAGNOSIS — D2271 Melanocytic nevi of right lower limb, including hip: Secondary | ICD-10-CM | POA: Diagnosis not present

## 2017-10-22 DIAGNOSIS — L433 Subacute (active) lichen planus: Secondary | ICD-10-CM | POA: Diagnosis not present

## 2017-10-22 DIAGNOSIS — Z01419 Encounter for gynecological examination (general) (routine) without abnormal findings: Secondary | ICD-10-CM

## 2017-10-22 DIAGNOSIS — D2372 Other benign neoplasm of skin of left lower limb, including hip: Secondary | ICD-10-CM | POA: Diagnosis not present

## 2017-10-22 DIAGNOSIS — I788 Other diseases of capillaries: Secondary | ICD-10-CM | POA: Diagnosis not present

## 2017-10-22 DIAGNOSIS — R6882 Decreased libido: Secondary | ICD-10-CM

## 2017-10-22 DIAGNOSIS — Z124 Encounter for screening for malignant neoplasm of cervix: Secondary | ICD-10-CM

## 2017-10-22 DIAGNOSIS — D2262 Melanocytic nevi of left upper limb, including shoulder: Secondary | ICD-10-CM | POA: Diagnosis not present

## 2017-10-22 DIAGNOSIS — L814 Other melanin hyperpigmentation: Secondary | ICD-10-CM | POA: Diagnosis not present

## 2017-10-22 DIAGNOSIS — L659 Nonscarring hair loss, unspecified: Secondary | ICD-10-CM | POA: Diagnosis not present

## 2017-10-22 DIAGNOSIS — D225 Melanocytic nevi of trunk: Secondary | ICD-10-CM | POA: Diagnosis not present

## 2017-10-22 MED ORDER — ESTRADIOL 0.1 MG/GM VA CREA: TOPICAL_CREAM | VAGINAL | 0 refills | Status: DC

## 2017-10-22 NOTE — Progress Notes (Signed)
67 y.o. G2P2 Married Caucasian female here for annual exam.    Bilateral sacral fracture from a fall in [place].  Current activity level is somewhat diminished.   Has Rx for Vagifem and vaginal estrace cream.  Not using both.  Vagifem not getting approval.  Used Estring in the post.  Not having sex.   Has vaginal tightness and decreased libido.  Off Effexor.  No significant change in hot flashes.   Stopped Finasteride for her hair and thinks it is not thinner.    Wants to try testosterone for decreased libido.   Also off Topamax.   Continues to travel a lot. Going to [place] and then [place].   PCP:  Josetta Huddle, MD  No LMP recorded. Patient has had a hysterectomy.           Sexually active: Yes.    The current method of family planning is status post hysterectomy.    Exercising: No.   Smoker:  no  Health Maintenance: Pap: 09-07-16 Neg, 2017 normal per patient History of abnormal Pap:  yes MMG: 02-12-17 Density B/Neg/BiRads1 Colonoscopy:    BMD:  2019 - Osteopenia.   TDaP: PCP Gardasil:   no HIV: Neg Hep C: Unsure Screening Labs - PCP.    reports that she has never smoked. She has never used smokeless tobacco. She reports that she does not drink alcohol or use drugs.  Past Medical History:  Diagnosis Date  . Abnormal Pap smear of cervix    Hx of cryotherapy to cervix in her 89s  . Abnormal uterine bleeding    history of fibroids  . Anemia   . Diabetes mellitus without complication ([place])   . DJD (degenerative joint disease)   . Fibroid   . Heart murmur    functional  . History of hypertension NO MEDS SINCE LAP GASTRIC BAND 2010 --  WT. LOSS  . Hypertension   . Hypothyroidism   . 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  STATES SHE CARRIES EPI PEN--BUT HAS NOT HAD ANY RECENT EPISODES.  STATES MANY OF THE MEDICATIONS SHE TAKES ARE TO HELP THIS PROBLEM -  INCLUDING  Ketotifen-mast cell inhibitor-not approved in [place].   . Migraine   . 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 Osteopenia   . Other fracture of sacrum, initial encounter for closed fracture ([place]) 06/2017  . PMR (polymyalgia rheumatica) ([facility])   . PONV (postoperative nausea and vomiting)   . Spondylosis     Past Surgical History:  Procedure Laterality Date  . ABDOMINAL HYSTERECTOMY  02/20/2011   Procedure: HYSTERECTOMY ABDOMINAL;  Surgeon: Bennetta Laos, MD;  Location: [facility];  Service: Gynecology;  Laterality: N/A;  . BREAST BIOPSY    . CERVICAL BIOPSY  W/ LOOP ELECTRODE EXCISION  1992  . CERVIX LESION DESTRUCTION    . [place]   X2  . CHOLECYSTECTOMY    . COLONOSCOPY    . DILATION AND CURETTAGE OF UTERUS     heavy menses  . HYSTEROSCOPY  06/1995   HYSTEROSCOPIC MYOMECTOMY  . HYSTEROSCOPY  06/1998   D&C, HYSTEROSCOPY  . KNEE SURGERY     X2 right knee  . LAPAROSCOPIC GASTRIC BANDING  08-03-2008   [facility] APS SYSTEM  . LESION REMOVAL N/A 09/02/2012   Procedure: EXCISION VAGINAL LESION;  Surgeon: Terrance Mass, MD;  Location:  [place];  Service: Gynecology;  Laterality: N/A;  cpt 57100  one hour  . PUBOVAGINAL SLING  02/20/2011   Procedure: Gaynelle Arabian;  Surgeon: Ailene Rud, MD;  Location: [facility];  Service: Urology;  Laterality: N/A;  . RECTOCELE REPAIR N/A 12/19/2016   Procedure: POSTERIOR REPAIR (RECTOCELE);  Surgeon: Nunzio Cobbs, MD;  Location: [facility];  Service: Gynecology;  Laterality: N/A;  1.25 hours  . SALPINGOOPHORECTOMY  02/20/2011   Procedure: SALPINGO OOPHERECTOMY;  Surgeon: Bennetta Laos, MD;  Location: [facility];  Service: Gynecology;  Laterality: Bilateral;  . UPPER GASTROINTESTINAL ENDOSCOPY  06/2011  . VENTRAL HERNIA REPAIR  05/24/2011   Procedure: LAPAROSCOPIC VENTRAL HERNIA;  Surgeon: Pedro Earls, MD;  Location[facility];  Service: General;  Laterality: N/A;     Current Outpatient Medications  Medication Sig Dispense Refill  . cetirizine (ZYRTEC) 10 MG tablet Take 10 mg by mouth daily.     . clonazePAM (KLONOPIN) 1 MG tablet Take 1 mg by mouth at bedtime as needed for anxiety.    Marland Kitchen EPINEPHrine (EPI-PEN) 0.3 mg/0.3 mL DEVI Inject into the muscle as needed.    Marland Kitchen estradiol (ESTRACE) 0.1 MG/GM vaginal cream Use a small amount around the introitus twice weekly at bed time. 42.5 g 1  . gabapentin (NEURONTIN) 100 MG capsule TAKE (2) CAPSULES BY MOUTH TWICE DAILY. 120 capsule 0  . hydrOXYzine (ATARAX/VISTARIL) 50 MG tablet Take 1 tablet by mouth 2 (two) times daily.    . hyoscyamine (LEVSIN SL) 0.125 MG SL tablet Place 1 tablet under the tongue as needed.    . insulin aspart (NOVOLOG) cartridge Inject into the skin 3 (three) times daily with meals. Patient has insulin pump    . montelukast (SINGULAIR) 10 MG tablet Take 10 mg by mouth at bedtime.     . ondansetron (ZOFRAN-ODT) 4 MG disintegrating tablet Take 1 tablet by mouth as needed.    Marland Kitchen PRESCRIPTION MEDICATION Take 1 tablet by mouth 2 (two) times daily. Ketotifen 3m tablet, [place]. For mast cell problem    . ranitidine (ZANTAC) 300 MG tablet Take 300 mg by mouth daily.    .Merril Abbe10 MCG TABS vaginal tablet INSERT 1 TABLET VAGINALLY AT BEDTIME FOR 2 WEEKS, AND THEN INSERT 1 VAGINALLY 2 TIMES A WEEK. 8 tablet 0  . zolmitriptan (ZOMIG) 5 MG tablet Take 5 mg by mouth as needed for migraine.    . clonazePAM (KLONOPIN) 2 MG tablet Take 1 tablet by mouth as needed.    .Marland Kitchenlosartan (COZAAR) 25 MG tablet Take 2 tablets by mouth daily.    .Marland KitchenSYNTHROID 150 MCG tablet Take 1 tablet by mouth daily.    .Marland KitchenVASCEPA 1 g CAPS Take 1 capsule by mouth 2 (two) times daily.     No current facility-administered medications for this visit.     Family History  Problem Relation Age of Onset  . Breast cancer Mother        671's . Hypertension Mother   . Thyroid disease Mother        hypothyroid   . Cancer Father        LUNG  . Thyroid disease Maternal Grandmother        hypothyroid    Review of Systems  Constitutional: Negative.   HENT: Negative.   Eyes: Negative.   Respiratory: Negative.   Cardiovascular: Negative.   Gastrointestinal: Negative.   Endocrine: Negative.  Genitourinary: Negative.   Musculoskeletal: Negative.   Skin: Negative.   Allergic/Immunologic: Negative.   Neurological: Negative.   Hematological: Negative.   Psychiatric/Behavioral: Negative.     Exam:   BP 104/62 (BP Location: Right Arm, Patient Position: Sitting, Cuff Size: Normal)   Pulse 84   Resp 18   Ht 5' 3.5" (1.613 m)   Wt 184 lb (83.5 kg)   BMI 32.08 kg/m     General appearance: alert, cooperative and appears stated age Head: Normocephalic, without obvious abnormality, atraumatic Neck: no adenopathy, supple, symmetrical, trachea midline and thyroid normal to inspection and palpation Lungs: clear to auscultation bilaterally Breasts: normal appearance, no masses or tenderness, No nipple retraction or dimpling, No nipple discharge or bleeding, No axillary or supraclavicular adenopathy Heart: regular rate and rhythm Abdomen: gastric surgery port site palpable in mid upper abdomen, glucose monitors on abdomen.  Abdomen is soft, non-tender; no masses, no organomegaly Extremities: extremities normal, atraumatic, no cyanosis or edema Skin: Skin color, texture, turgor normal. No rashes or lesions Lymph nodes: Cervical, supraclavicular, and axillary nodes normal. No abnormal inguinal nodes palpated Neurologic: Grossly normal  Pelvic: External genitalia:  no lesions              Urethra:  normal appearing urethra with no masses, tenderness or lesions              Bartholins and Skenes: normal                 Vagina: normal appearing vagina with normal color and discharge, no lesions.  Tight introital ring, otherwise normal caliber.              Cervix:  absent              Pap taken:  No. Bimanual Exam:  Uterus:   absent              Adnexa: no mass, fullness, tenderness              Rectal exam: Yes.  .  Confirms.              Anus:  normal sphincter tone, no lesions  Chaperone was present for exam.  Assessment:   Well woman visit with normal exam. Status post total abdominal hysterectomy for fibroids. Status post BSO.  Status post midurethral sling.  Status post rectocele repair. Tight introitus.  Bilateral sacral fracture 06/2017.  Hx of abnormal paps and cryo, LEEP, and conization of cervix.  Decreased libido.  Diabetes.  Plan: Mammogram screening yearly.  Recommended self breast awareness. Pap and HR HPV as above. Guidelines for Calcium, Vitamin D, regular exercise program including cardiovascular and weight bearing exercise. Will switch to vaginal estrogen cream.   Will order vaginal dilator kit.  If inadequate response to dilator kit, can consider hymenal revision.  Check tesoterone level.  Anticipate testosterone therapy.  Will get copy of BMD.  We talked about using compression hose, exercising legs, and hydrating with water while traveling in order to reduce risk of DVT.  Follow up annually and prn.  After visit summary provided.

## 2017-10-22 NOTE — Patient Instructions (Signed)

## 2017-10-24 ENCOUNTER — Ambulatory Visit: Payer: BLUE CROSS/BLUE SHIELD | Admitting: Psychology

## 2017-10-24 LAB — TESTOSTERONE, FREE, DIRECT

## 2017-10-29 ENCOUNTER — Encounter: Payer: Self-pay | Admitting: Obstetrics and Gynecology

## 2017-11-02 ENCOUNTER — Encounter: Payer: Self-pay | Admitting: Obstetrics and Gynecology

## 2017-11-02 ENCOUNTER — Ambulatory Visit (INDEPENDENT_AMBULATORY_CARE_PROVIDER_SITE_OTHER): Payer: Medicare Other | Admitting: Obstetrics and Gynecology

## 2017-11-02 VITALS — BP 116/80 | HR 62 | Temp 97.8°F | Resp 16 | Ht 64.0 in | Wt 185.2 lb

## 2017-11-02 DIAGNOSIS — N896 Tight hymenal ring: Secondary | ICD-10-CM

## 2017-11-02 DIAGNOSIS — R7989 Other specified abnormal findings of blood chemistry: Secondary | ICD-10-CM | POA: Diagnosis not present

## 2017-11-02 MED ORDER — NONFORMULARY OR COMPOUNDED ITEM: 1 refills | Status: DC

## 2017-11-02 NOTE — Progress Notes (Signed)
GYNECOLOGY  VISIT   HPI: 67 y.o.   Married  Caucasian  female   G2P2 with No LMP recorded. Patient has had a hysterectomy.   here for Follow up of testosterone level check and dilator therapy for tight introitus.   Has low libido and total testosterone < 2.5 and free testosterone <0.2.   Off Effexor and Finasteride.   GYNECOLOGIC HISTORY: No LMP recorded. Patient has had a hysterectomy. Contraception:   NA Menopausal hormone therapy:   NA Last mammogram:  02/12/17 - BI-RADS1 Last pap smear:   09/07/16 - neg.  2017 normal per patient.         OB History    Gravida  2   Para  2   Term      Preterm      AB      Living  2     SAB      TAB      Ectopic      Multiple      Live Births                 Patient Active Problem List   Diagnosis Date Noted  . Murmur 04/14/2017  . Postmenopausal HRT (hormone replacement therapy) 10/29/2014  . Hot flashes, menopausal 10/29/2014  . Fitting and adjustment of gastric lap band 01/14/2013  . Obese 01/14/2013  . H/O vitamin D deficiency 08/09/2012  . Unspecified hypothyroidism 08/09/2012  . Menopause 08/09/2012  . Postoperative seroma-in ventral hernia repair site 07/20/2011  . Lapband APS April 2010 07/20/2011  . Hypertension   . Mast cell disorder   . DEGENERATIVE JOINT DISEASE 02/19/2008  . SPONDYLOSIS UNSPEC SITE W/O MENTION MYELOPATHY 02/19/2008  . SINUS TARSI SYNDROME 02/19/2008  . UNEQUAL LEG LENGTH 02/19/2008    Past Medical History:  Diagnosis Date  . Abnormal Pap smear of cervix    Hx of cryotherapy to cervix in her 41s  . Abnormal uterine bleeding    history of fibroids  . Anemia   . Diabetes mellitus without complication ([place])   . DJD (degenerative joint disease)   . Fibroid   . Heart murmur    functional  . History of hypertension NO MEDS SINCE LAP GASTRIC BAND 2010 --  WT. LOSS  . Hypertension   . Hypothyroidism   . Low testosterone level in female 2019  . 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  STATES SHE CARRIES EPI PEN--BUT HAS NOT HAD ANY RECENT EPISODES.  STATES MANY OF THE MEDICATIONS SHE TAKES ARE TO HELP THIS PROBLEM - INCLUDING  Ketotifen-mast cell inhibitor-not approved in [place].   . Migraine   . 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 Osteopenia   . Other fracture of sacrum, initial encounter for closed fracture ([place]) 06/2017  . PMR (polymyalgia rheumatica) ([facility])   . PONV (postoperative nausea and vomiting)   . Spondylosis     Past Surgical History:  Procedure Laterality Date  . ABDOMINAL HYSTERECTOMY  02/20/2011   Procedure: HYSTERECTOMY ABDOMINAL;  Surgeon: Bennetta Laos, MD;  Location: [facility];  Service: Gynecology;  Laterality: N/A;  . BREAST BIOPSY    . CERVICAL BIOPSY  W/ LOOP ELECTRODE EXCISION  1992  . CERVIX LESION DESTRUCTION    . [place]   X2  . CHOLECYSTECTOMY    . COLONOSCOPY    .  DILATION AND CURETTAGE OF UTERUS     heavy menses  . HYSTEROSCOPY  06/1995   HYSTEROSCOPIC MYOMECTOMY  . HYSTEROSCOPY  06/1998   D&C, HYSTEROSCOPY  . KNEE SURGERY     X2 right knee  . LAPAROSCOPIC GASTRIC BANDING  08-03-2008   [facility] APS SYSTEM  . LESION REMOVAL N/A 09/02/2012   Procedure: EXCISION VAGINAL LESION;  Surgeon: Terrance Mass, MD;  Location: [facility];  Service: Gynecology;  Laterality: N/A;  cpt 57100  one hour  . PUBOVAGINAL SLING  02/20/2011   Procedure: Gaynelle Arabian;  Surgeon: Ailene Rud, MD;  Location: [facility];  Service: Urology;  Laterality: N/A;  . RECTOCELE REPAIR N/A 12/19/2016   Procedure: POSTERIOR REPAIR (RECTOCELE);  Surgeon: Nunzio Cobbs, MD;  Location: [facility];  Service: Gynecology;  Laterality: N/A;  1.25 hours  . SALPINGOOPHORECTOMY  02/20/2011   Procedure: SALPINGO OOPHERECTOMY;  Surgeon: Bennetta Laos, MD;  Location: [facility];  Service:  Gynecology;  Laterality: Bilateral;  . UPPER GASTROINTESTINAL ENDOSCOPY  06/2011  . VENTRAL HERNIA REPAIR  05/24/2011   Procedure: LAPAROSCOPIC VENTRAL HERNIA;  Surgeon: Pedro Earls, MD;  Location: [facility];  Service: General;  Laterality: N/A;    Current Outpatient Medications  Medication Sig Dispense Refill  . cetirizine (ZYRTEC) 10 MG tablet Take 10 mg by mouth daily.     . clonazePAM (KLONOPIN) 2 MG tablet Take 1 tablet by mouth as needed.    Marland Kitchen EPINEPHrine (EPI-PEN) 0.3 mg/0.3 mL DEVI Inject into the muscle as needed.    Marland Kitchen estradiol (ESTRACE) 0.1 MG/GM vaginal cream Use 1/2 gram per vaginal at hs twice weekly. 42.5 g 0  . gabapentin (NEURONTIN) 100 MG capsule TAKE (2) CAPSULES BY MOUTH TWICE DAILY. 120 capsule 0  . hydrOXYzine (ATARAX/VISTARIL) 50 MG tablet Take 1 tablet by mouth 2 (two) times daily.    . hyoscyamine (LEVSIN SL) 0.125 MG SL tablet Place 1 tablet under the tongue as needed.    . insulin aspart (NOVOLOG) cartridge Inject into the skin 3 (three) times daily with meals. Patient has insulin pump    . losartan (COZAAR) 25 MG tablet Take 2 tablets by mouth daily.    . montelukast (SINGULAIR) 10 MG tablet Take 10 mg by mouth at bedtime.     . ondansetron (ZOFRAN-ODT) 4 MG disintegrating tablet Take 1 tablet by mouth as needed.    Marland Kitchen PRESCRIPTION MEDICATION Take 1 tablet by mouth 2 (two) times daily. Ketotifen 83m tablet[place]. For mast cell problem    . ranitidine (ZANTAC) 300 MG tablet Take 300 mg by mouth daily.    .Marland KitchenSYNTHROID 150 MCG tablet Take 1 tablet by mouth daily.    .Marland KitchenVASCEPA 1 g CAPS Take 1 capsule by mouth 2 (two) times daily.    .Marland Kitchenzolmitriptan (ZOMIG) 5 MG tablet Take 5 mg by mouth as needed for migraine.     No current facility-administered medications for this visit.      ALLERGIES: Meperidine and Demerol  Family History  Problem Relation Age of Onset  . Breast cancer Mother        657's . Hypertension Mother   . Thyroid disease  Mother        hypothyroid  . Cancer Father        LUNG  . Thyroid disease Maternal Grandmother        hypothyroid    Social History   Socioeconomic History  .  Marital status: Married    Spouse name: Not on file  . Number of children: Not on file  . Years of education: Not on file  . Highest education level: Not on file  Occupational History  . Not on file  Social Needs  . Financial resource strain: Not on file  . Food insecurity:    Worry: Not on file    Inability: Not on file  . Transportation needs:    Medical: Not on file    Non-medical: Not on file  Tobacco Use  . Smoking status: Never Smoker  . Smokeless tobacco: Never Used  Substance and Sexual Activity  . Alcohol use: No    Alcohol/week: 0.0 oz    Comment: social  . Drug use: No  . Sexual activity: Yes    Partners: Male    Birth control/protection: Surgical    Comment: TAH/BSO  Lifestyle  . Physical activity:    Days per week: Not on file    Minutes per session: Not on file  . Stress: Not on file  Relationships  . Social connections:    Talks on phone: Not on file    Gets together: Not on file    Attends religious service: Not on file    Active member of club or organization: Not on file    Attends meetings of clubs or organizations: Not on file    Relationship status: Not on file  . Intimate partner violence:    Fear of current or ex partner: Not on file    Emotionally abused: Not on file    Physically abused: Not on file    Forced sexual activity: Not on file  Other Topics Concern  . Not on file  Social History Narrative  . Not on file    Review of Systems  Constitutional: Negative.   HENT: Negative.   Eyes: Negative.   Respiratory: Negative.   Cardiovascular: Negative.   Gastrointestinal: Negative.   Endocrine: Negative.   Genitourinary: Negative.   Musculoskeletal: Negative.   Skin: Negative.   Allergic/Immunologic: Negative.   Neurological: Negative.   Hematological: Negative.    Psychiatric/Behavioral: Negative.     PHYSICAL EXAMINATION:    BP 116/80   Pulse 62   Temp 97.8 F (36.6 C)   Resp 16   Ht 5' 4"  (1.626 m)   Wt 185 lb 3.2 oz (84 kg)   BMI 31.79 kg/m     General appearance: alert, cooperative and appears stated age   Able to tolerate #2 and #3 dilator.   Chaperone was present for exam.  ASSESSMENT  Status post total abdominal hysterectomy for fibroids. Status post BSO.  Status post midurethral sling. Status post rectocele repair. Tight introitus.  Bilateral sacral fracture 06/2017.  Hx of abnormal paps and cryo, LEEP, and conization of cervix. Decreased libido.  Low testosterone.  Diabetes.  PLAN  Dilator kit to patient.  Use dilators twice daily with water based lubricant.  Use vaginal estrogen cream for 2 weeks and then 2 - 3 times per week.  Recheck in 6 weeks.    An After Visit Summary was printed and given to the patient.  __15____ minutes face to face time of which over 50% was spent in counseling.

## 2017-11-05 ENCOUNTER — Other Ambulatory Visit: Payer: Self-pay | Admitting: Obstetrics and Gynecology

## 2017-11-05 NOTE — Telephone Encounter (Signed)
Medication refill request: Gabapentin 100 mg Last AEX:  10/22/17 Next AEX: 11/13/18 Last MMG (if hormonal medication request): 02/12/17  Bi-Rads Category 1 Neg  Refill authorized: please refill if appropriate.

## 2017-11-08 ENCOUNTER — Other Ambulatory Visit: Payer: Self-pay | Admitting: Orthopedic Surgery

## 2017-11-08 DIAGNOSIS — R102 Pelvic and perineal pain: Secondary | ICD-10-CM

## 2017-11-08 DIAGNOSIS — M84350D Stress fracture, pelvis, subsequent encounter for fracture with routine healing: Secondary | ICD-10-CM | POA: Diagnosis not present

## 2017-11-11 ENCOUNTER — Other Ambulatory Visit: Payer: Self-pay

## 2017-11-12 DIAGNOSIS — Z0289 Encounter for other administrative examinations: Secondary | ICD-10-CM

## 2017-11-13 ENCOUNTER — Ambulatory Visit
Admission: RE | Admit: 2017-11-13 | Discharge: 2017-11-13 | Disposition: A | Discharge status: Home / Self Care | Payer: Medicare Other | Source: Ambulatory Visit | Attending: Orthopedic Surgery | Admitting: Orthopedic Surgery

## 2017-11-13 DIAGNOSIS — M353 Polymyalgia rheumatica: Secondary | ICD-10-CM | POA: Diagnosis not present

## 2017-11-13 DIAGNOSIS — M545 Low back pain: Secondary | ICD-10-CM | POA: Diagnosis not present

## 2017-11-13 DIAGNOSIS — R102 Pelvic and perineal pain: Secondary | ICD-10-CM

## 2017-11-13 DIAGNOSIS — I10 Essential (primary) hypertension: Secondary | ICD-10-CM | POA: Diagnosis not present

## 2017-11-13 DIAGNOSIS — K5730 Diverticulosis of large intestine without perforation or abscess without bleeding: Secondary | ICD-10-CM | POA: Diagnosis not present

## 2017-11-13 DIAGNOSIS — M859 Disorder of bone density and structure, unspecified: Secondary | ICD-10-CM | POA: Diagnosis not present

## 2017-11-13 DIAGNOSIS — E038 Other specified hypothyroidism: Secondary | ICD-10-CM | POA: Diagnosis not present

## 2017-11-13 DIAGNOSIS — E559 Vitamin D deficiency, unspecified: Secondary | ICD-10-CM | POA: Diagnosis not present

## 2017-11-13 DIAGNOSIS — M533 Sacrococcygeal disorders, not elsewhere classified: Secondary | ICD-10-CM | POA: Diagnosis not present

## 2017-11-13 DIAGNOSIS — E1129 Type 2 diabetes mellitus with other diabetic kidney complication: Secondary | ICD-10-CM | POA: Diagnosis not present

## 2017-11-13 DIAGNOSIS — Z794 Long term (current) use of insulin: Secondary | ICD-10-CM | POA: Diagnosis not present

## 2017-11-13 DIAGNOSIS — R600 Localized edema: Secondary | ICD-10-CM | POA: Diagnosis not present

## 2017-11-13 DIAGNOSIS — N183 Chronic kidney disease, stage 3 (moderate): Secondary | ICD-10-CM | POA: Diagnosis not present

## 2017-11-13 DIAGNOSIS — D808 Other immunodeficiencies with predominantly antibody defects: Secondary | ICD-10-CM | POA: Diagnosis not present

## 2017-11-13 DIAGNOSIS — Z6831 Body mass index (BMI) 31.0-31.9, adult: Secondary | ICD-10-CM | POA: Diagnosis not present

## 2017-11-13 DIAGNOSIS — H43812 Vitreous degeneration, left eye: Secondary | ICD-10-CM | POA: Diagnosis not present

## 2017-11-14 DIAGNOSIS — N183 Chronic kidney disease, stage 3 (moderate): Secondary | ICD-10-CM | POA: Diagnosis not present

## 2017-11-14 DIAGNOSIS — E1129 Type 2 diabetes mellitus with other diabetic kidney complication: Secondary | ICD-10-CM | POA: Diagnosis not present

## 2017-11-14 DIAGNOSIS — Z4681 Encounter for fitting and adjustment of insulin pump: Secondary | ICD-10-CM | POA: Diagnosis not present

## 2017-11-14 DIAGNOSIS — Z794 Long term (current) use of insulin: Secondary | ICD-10-CM | POA: Diagnosis not present

## 2017-11-14 DIAGNOSIS — M533 Sacrococcygeal disorders, not elsewhere classified: Secondary | ICD-10-CM | POA: Diagnosis not present

## 2017-11-14 DIAGNOSIS — I10 Essential (primary) hypertension: Secondary | ICD-10-CM | POA: Diagnosis not present

## 2017-11-15 DIAGNOSIS — M84350D Stress fracture, pelvis, subsequent encounter for fracture with routine healing: Secondary | ICD-10-CM | POA: Diagnosis not present

## 2017-11-28 ENCOUNTER — Encounter: Payer: Self-pay | Admitting: Cardiothoracic Surgery

## 2017-11-28 DIAGNOSIS — Z794 Long term (current) use of insulin: Secondary | ICD-10-CM | POA: Diagnosis not present

## 2017-11-28 DIAGNOSIS — I10 Essential (primary) hypertension: Secondary | ICD-10-CM | POA: Diagnosis not present

## 2017-11-28 DIAGNOSIS — Z6830 Body mass index (BMI) 30.0-30.9, adult: Secondary | ICD-10-CM | POA: Diagnosis not present

## 2017-11-28 DIAGNOSIS — Z4681 Encounter for fitting and adjustment of insulin pump: Secondary | ICD-10-CM | POA: Diagnosis not present

## 2017-11-28 DIAGNOSIS — E1129 Type 2 diabetes mellitus with other diabetic kidney complication: Secondary | ICD-10-CM | POA: Diagnosis not present

## 2017-12-20 DIAGNOSIS — N183 Chronic kidney disease, stage 3 (moderate): Secondary | ICD-10-CM | POA: Diagnosis not present

## 2017-12-20 DIAGNOSIS — Z4681 Encounter for fitting and adjustment of insulin pump: Secondary | ICD-10-CM | POA: Diagnosis not present

## 2017-12-20 DIAGNOSIS — Z794 Long term (current) use of insulin: Secondary | ICD-10-CM | POA: Diagnosis not present

## 2017-12-20 DIAGNOSIS — E1129 Type 2 diabetes mellitus with other diabetic kidney complication: Secondary | ICD-10-CM | POA: Diagnosis not present

## 2017-12-20 DIAGNOSIS — I10 Essential (primary) hypertension: Secondary | ICD-10-CM | POA: Diagnosis not present

## 2017-12-21 ENCOUNTER — Other Ambulatory Visit (INDEPENDENT_AMBULATORY_CARE_PROVIDER_SITE_OTHER): Payer: Medicare Other

## 2017-12-21 ENCOUNTER — Other Ambulatory Visit: Payer: Self-pay | Admitting: Obstetrics and Gynecology

## 2017-12-21 DIAGNOSIS — R6882 Decreased libido: Secondary | ICD-10-CM

## 2017-12-24 ENCOUNTER — Ambulatory Visit (INDEPENDENT_AMBULATORY_CARE_PROVIDER_SITE_OTHER): Payer: Medicare Other | Admitting: Obstetrics and Gynecology

## 2017-12-24 ENCOUNTER — Encounter: Payer: Self-pay | Admitting: Obstetrics and Gynecology

## 2017-12-24 VITALS — BP 116/70 | HR 68 | Resp 16 | Ht 64.0 in | Wt 182.0 lb

## 2017-12-24 DIAGNOSIS — R7989 Other specified abnormal findings of blood chemistry: Secondary | ICD-10-CM

## 2017-12-24 LAB — TESTOSTERONE, FREE, DIRECT: TESTOSTERONE, TOTAL: 8.9 ng/dL (ref 7.0–40.0)

## 2017-12-24 NOTE — Progress Notes (Signed)
GYNECOLOGY  VISIT   HPI: 67 y.o.   Married  Caucasian  female   G2P2 with No LMP recorded (lmp unknown). Patient has had a hysterectomy.   here for discuss labs.  Using testosterone twice daily.  Free testosterone < 0.2. Total testosterone is pending.   Not noticing any change in libido or energy level.  Not using vaginal dilators due to traveling.   Not having very many hot flashes.  Is off Effexor but on Gabapentin.   GYNECOLOGIC HISTORY: No LMP recorded (lmp unknown). Patient has had a hysterectomy. Contraception:  n/a Menopausal hormone therapy:  none Last mammogram:  02/12/17 BIRADS 1 negative Last pap smear:   09/07/16 Negative        OB History    Gravida  2   Para  2   Term      Preterm      AB      Living  2     SAB      TAB      Ectopic      Multiple      Live Births                 Patient Active Problem List   Diagnosis Date Noted  . Murmur 04/14/2017  . Postmenopausal HRT (hormone replacement therapy) 10/29/2014  . Hot flashes, menopausal 10/29/2014  . Fitting and adjustment of gastric lap band 01/14/2013  . Obese 01/14/2013  . H/O vitamin D deficiency 08/09/2012  . Unspecified hypothyroidism 08/09/2012  . Menopause 08/09/2012  . Postoperative seroma-in ventral hernia repair site 07/20/2011  . Lapband APS April 2010 07/20/2011  . Hypertension   . Mast cell disorder   . DEGENERATIVE JOINT DISEASE 02/19/2008  . SPONDYLOSIS UNSPEC SITE W/O MENTION MYELOPATHY 02/19/2008  . SINUS TARSI SYNDROME 02/19/2008  . UNEQUAL LEG LENGTH 02/19/2008    Past Medical History:  Diagnosis Date  . Abnormal Pap smear of cervix    Hx of cryotherapy to cervix in her 73s  . Abnormal uterine bleeding    history of fibroids  . Anemia   . Diabetes mellitus without complication ([place])   . DJD (degenerative joint disease)   . Fibroid   . Heart murmur    functional  . History of hypertension NO MEDS SINCE LAP GASTRIC BAND 2010 --  WT. LOSS  .  Hypertension   . Hypothyroidism   . Low testosterone level in female 2019  . 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  STATES SHE CARRIES EPI PEN--BUT HAS NOT HAD ANY RECENT EPISODES.  STATES MANY OF THE MEDICATIONS SHE TAKES ARE TO HELP THIS PROBLEM - INCLUDING  Ketotifen-mast cell inhibitor-not approved in [place].   . Migraine   . 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 Osteopenia   . Other fracture of sacrum, initial encounter for closed fracture ([place]) 06/2017  . PMR (polymyalgia rheumatica) ([facility])   . PONV (postoperative nausea and vomiting)   . Spondylosis     Past Surgical History:  Procedure Laterality Date  . ABDOMINAL HYSTERECTOMY  02/20/2011   Procedure: HYSTERECTOMY ABDOMINAL;  Surgeon: Bennetta Laos, MD;  Location: [facility];  Service: Gynecology;  Laterality: N/A;  . BREAST BIOPSY    . CERVICAL BIOPSY  W/ LOOP ELECTRODE EXCISION  1992  . CERVIX LESION DESTRUCTION    . CESAREAN SECTION  1984,  1987   X2  . CHOLECYSTECTOMY    . COLONOSCOPY    . DILATION AND CURETTAGE OF UTERUS     heavy menses  . HYSTEROSCOPY  06/1995   HYSTEROSCOPIC MYOMECTOMY  . HYSTEROSCOPY  06/1998   D&C, HYSTEROSCOPY  . KNEE SURGERY     X2 right knee  . LAPAROSCOPIC GASTRIC BANDING  08-03-2008   [facility] APS SYSTEM  . LESION REMOVAL N/A 09/02/2012   Procedure: EXCISION VAGINAL LESION;  Surgeon: Terrance Mass, MD;  Location: [facility];  Service: Gynecology;  Laterality: N/A;  cpt 57100  one hour  . PUBOVAGINAL SLING  02/20/2011   Procedure: Gaynelle Arabian;  Surgeon: Ailene Rud, MD;  Location: [facility];  Service: Urology;  Laterality: N/A;  . RECTOCELE REPAIR N/A 12/19/2016   Procedure: POSTERIOR REPAIR (RECTOCELE);  Surgeon: Nunzio Cobbs, MD;  Location: [facility];  Service: Gynecology;  Laterality: N/A;  1.25 hours  . SALPINGOOPHORECTOMY   02/20/2011   Procedure: SALPINGO OOPHERECTOMY;  Surgeon: Bennetta Laos, MD;  Location: [facility];  Service: Gynecology;  Laterality: Bilateral;  . UPPER GASTROINTESTINAL ENDOSCOPY  06/2011  . VENTRAL HERNIA REPAIR  05/24/2011   Procedure: LAPAROSCOPIC VENTRAL HERNIA;  Surgeon: Pedro Earls, MD;  Location: [facility];  Service: General;  Laterality: N/A;    Current Outpatient Medications  Medication Sig Dispense Refill  . cetirizine (ZYRTEC) 10 MG tablet Take 10 mg by mouth daily.     . clonazePAM (KLONOPIN) 2 MG tablet Take 1 tablet by mouth as needed.    Marland Kitchen EPINEPHrine (EPI-PEN) 0.3 mg/0.3 mL DEVI Inject into the muscle as needed.    Marland Kitchen estradiol (ESTRACE) 0.1 MG/GM vaginal cream Use 1/2 gram per vaginal at hs twice weekly. 42.5 g 0  . gabapentin (NEURONTIN) 100 MG capsule TAKE (2) CAPSULES BY MOUTH TWICE DAILY. 120 capsule 3  . hydrOXYzine (ATARAX/VISTARIL) 50 MG tablet Take 1 tablet by mouth 2 (two) times daily.    . hyoscyamine (LEVSIN SL) 0.125 MG SL tablet Place 1 tablet under the tongue as needed.    . insulin aspart (NOVOLOG) cartridge Inject into the skin 3 (three) times daily with meals. Patient has insulin pump    . losartan (COZAAR) 25 MG tablet Take 2 tablets by mouth daily.    . montelukast (SINGULAIR) 10 MG tablet Take 10 mg by mouth at bedtime.     . NONFORMULARY OR COMPOUNDED ITEM Testosterone propionate 2% in white petrolatum, apply bid for 6 weeks and then daily as directed.  60 grams. 60 each 1  . ondansetron (ZOFRAN-ODT) 4 MG disintegrating tablet Take 1 tablet by mouth as needed.    Marland Kitchen PRESCRIPTION MEDICATION Take 1 tablet by mouth 2 (two) times daily. Ketotifen 5mg  tablet[place] For mast cell problem    . ranitidine (ZANTAC) 300 MG tablet Take 300 mg by mouth daily.    Marland Kitchen SYNTHROID 150 MCG tablet Take 1 tablet by mouth daily.    Marland Kitchen VASCEPA 1 g CAPS Take 1 capsule by mouth 2 (two) times daily.    Marland Kitchen zolmitriptan (ZOMIG) 5 MG tablet Take 5 mg by mouth  as needed for migraine.     No current facility-administered medications for this visit.      ALLERGIES: Meperidine and Demerol  Family History  Problem Relation Age of Onset  . Breast cancer Mother        58's  . Hypertension Mother   . Thyroid disease  Mother        hypothyroid  . Cancer Father        LUNG  . Thyroid disease Maternal Grandmother        hypothyroid    Social History   Socioeconomic History  . Marital status: Married    Spouse name: Not on file  . Number of children: Not on file  . Years of education: Not on file  . Highest education level: Not on file  Occupational History  . Not on file  Social Needs  . Financial resource strain: Not on file  . Food insecurity:    Worry: Not on file    Inability: Not on file  . Transportation needs:    Medical: Not on file    Non-medical: Not on file  Tobacco Use  . Smoking status: Never Smoker  . Smokeless tobacco: Never Used  Substance and Sexual Activity  . Alcohol use: No    Alcohol/week: 0.0 standard drinks    Comment: social  . Drug use: No  . Sexual activity: Yes    Partners: Male    Birth control/protection: Surgical    Comment: TAH/BSO  Lifestyle  . Physical activity:    Days per week: Not on file    Minutes per session: Not on file  . Stress: Not on file  Relationships  . Social connections:    Talks on phone: Not on file    Gets together: Not on file    Attends religious service: Not on file    Active member of club or organization: Not on file    Attends meetings of clubs or organizations: Not on file    Relationship status: Not on file  . Intimate partner violence:    Fear of current or ex partner: Not on file    Emotionally abused: Not on file    Physically abused: Not on file    Forced sexual activity: Not on file  Other Topics Concern  . Not on file  Social History Narrative  . Not on file    Review of Systems  All other systems reviewed and are negative.   PHYSICAL  EXAMINATION:    BP 116/70 (BP Location: Right Arm, Patient Position: Sitting, Cuff Size: Normal)   Pulse 68   Resp 16   Ht 5\' 4"  (1.626 m)   Wt 182 lb (82.6 kg)   LMP  (LMP Unknown)   BMI 31.24 kg/m     General appearance: alert, cooperative and appears stated age  ASSESSMENT  Decreased libido.  On testosterone replacement.  Tight introitus following posterior colporrhaphy.   PLAN  Increase dosage of testosterone and continue twice daily. We reviewed use of vaginal estrogen cream and the use of the dilators.  I explained how the introitus could be modified surgically if the dilators are not successful. RU in 2 months for a recheck.  Will recheck a testosterone level then.    An After Visit Summary was printed and given to the patient.  __15____ minutes face to face time of which over 50% was spent in counseling.

## 2018-01-08 DIAGNOSIS — M84350D Stress fracture, pelvis, subsequent encounter for fracture with routine healing: Secondary | ICD-10-CM | POA: Diagnosis not present

## 2018-01-09 ENCOUNTER — Encounter: Payer: Medicare Other | Admitting: Cardiothoracic Surgery

## 2018-01-15 DIAGNOSIS — Z885 Allergy status to narcotic agent status: Secondary | ICD-10-CM | POA: Diagnosis not present

## 2018-01-15 DIAGNOSIS — E538 Deficiency of other specified B group vitamins: Secondary | ICD-10-CM | POA: Diagnosis not present

## 2018-01-15 DIAGNOSIS — I129 Hypertensive chronic kidney disease with stage 1 through stage 4 chronic kidney disease, or unspecified chronic kidney disease: Secondary | ICD-10-CM | POA: Diagnosis not present

## 2018-01-15 DIAGNOSIS — Z794 Long term (current) use of insulin: Secondary | ICD-10-CM | POA: Diagnosis not present

## 2018-01-15 DIAGNOSIS — Z79899 Other long term (current) drug therapy: Secondary | ICD-10-CM | POA: Diagnosis not present

## 2018-01-15 DIAGNOSIS — Z9641 Presence of insulin pump (external) (internal): Secondary | ICD-10-CM | POA: Diagnosis not present

## 2018-01-15 DIAGNOSIS — E1122 Type 2 diabetes mellitus with diabetic chronic kidney disease: Secondary | ICD-10-CM | POA: Diagnosis not present

## 2018-01-15 DIAGNOSIS — L439 Lichen planus, unspecified: Secondary | ICD-10-CM | POA: Diagnosis not present

## 2018-01-15 DIAGNOSIS — N183 Chronic kidney disease, stage 3 (moderate): Secondary | ICD-10-CM | POA: Diagnosis not present

## 2018-01-15 DIAGNOSIS — R7989 Other specified abnormal findings of blood chemistry: Secondary | ICD-10-CM | POA: Diagnosis not present

## 2018-01-15 DIAGNOSIS — D631 Anemia in chronic kidney disease: Secondary | ICD-10-CM | POA: Diagnosis not present

## 2018-01-15 DIAGNOSIS — D519 Vitamin B12 deficiency anemia, unspecified: Secondary | ICD-10-CM | POA: Diagnosis not present

## 2018-01-17 ENCOUNTER — Other Ambulatory Visit: Payer: Self-pay | Admitting: Internal Medicine

## 2018-01-17 DIAGNOSIS — Z1231 Encounter for screening mammogram for malignant neoplasm of breast: Secondary | ICD-10-CM

## 2018-01-22 ENCOUNTER — Encounter: Payer: Self-pay | Admitting: *Deleted

## 2018-01-22 ENCOUNTER — Institutional Professional Consult (permissible substitution) (INDEPENDENT_AMBULATORY_CARE_PROVIDER_SITE_OTHER): Payer: Medicare Other | Admitting: Cardiothoracic Surgery

## 2018-01-22 ENCOUNTER — Other Ambulatory Visit: Payer: Self-pay

## 2018-01-22 ENCOUNTER — Encounter: Payer: Self-pay | Admitting: Cardiothoracic Surgery

## 2018-01-22 VITALS — BP 140/88 | HR 82 | Resp 18 | Ht 64.0 in | Wt 185.8 lb

## 2018-01-22 DIAGNOSIS — I712 Thoracic aortic aneurysm, without rupture, unspecified: Secondary | ICD-10-CM

## 2018-01-22 NOTE — Progress Notes (Signed)
PCP is Josetta Huddle, MD Referring Provider is Josetta Huddle, MD  Chief Complaint  Patient presents with  . Thoracic Aortic Aneurysm    New patient consultation, TAA, Chest CT 09/20/2017  . New Patient (Initial Visit)  Patient examined, images of outside CT scan of chest personally reviewed and counseled with patient.  HPI: 67 year old healthy hypertensive diabetic overweight female presents for evaluation of a recently diagnosed asymptomatic, mild, 4.0 cm fusiform ascending aortic aneurysm.  She apparently had an abnormal chest x-ray showing a tortuous aorta and a subsequent CT scan of the chest was performed May 23 in [place].  Patient's CT scan was notable for a coronary calcium score of 0.  There are no pulmonary nodules infiltrates or mediastinal adenopathy.  The ascending aorta measured 4.1 cm.  It was smooth-walled without ulceration or thickening.  Patient has hypertension and takes losartan. There is no family history of thoracic or abdominal aortic aneurysm disease. No family history of sudden death that could be related to a potential dissection. Patient is a non-smoker. She takes an antilipid agent for elevated triglyceride.   Past Medical History:  Diagnosis Date  . Abnormal Pap smear of cervix    Hx of cryotherapy to cervix in her 46s  . Abnormal uterine bleeding    history of fibroids  . Anemia   . Diabetes mellitus without complication ([place])   . DJD (degenerative joint disease)   . Fibroid   . Heart murmur    functional  . History of hypertension NO MEDS SINCE LAP GASTRIC BAND 2010 --  WT. LOSS  . Hypertension   . Hypothyroidism   . Low testosterone level in female 2019  . 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  STATES SHE CARRIES EPI PEN--BUT HAS NOT HAD ANY RECENT EPISODES.  STATES MANY OF THE MEDICATIONS SHE TAKES ARE TO HELP THIS PROBLEM - INCLUDING   Ketotifen-mast cell inhibitor-not approved in [place].   . Migraine   . 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 Osteopenia   . Other fracture of sacrum, initial encounter for closed fracture ([place]) 06/2017  . PMR (polymyalgia rheumatica) (HCC)   . PONV (postoperative nausea and vomiting)   . Spondylosis     Past Surgical History:  Procedure Laterality Date  . ABDOMINAL HYSTERECTOMY  02/20/2011   Procedure: HYSTERECTOMY ABDOMINAL;  Surgeon: Bennetta Laos, MD;  Location: [facility];  Service: Gynecology;  Laterality: N/A;  . BREAST BIOPSY    . CERVICAL BIOPSY  W/ LOOP ELECTRODE EXCISION  1992  . CERVIX LESION DESTRUCTION    . [place]   X2  . CHOLECYSTECTOMY    . COLONOSCOPY    . DILATION AND CURETTAGE OF UTERUS     heavy menses  . HYSTEROSCOPY  06/1995   HYSTEROSCOPIC MYOMECTOMY  . HYSTEROSCOPY  06/1998   D&C, HYSTEROSCOPY  . KNEE SURGERY     X2 right knee  . LAPAROSCOPIC GASTRIC BANDING  08-03-2008   [facility] APS SYSTEM  . LESION REMOVAL N/A 09/02/2012   Procedure: EXCISION VAGINAL LESION;  Surgeon: Terrance Mass, MD;  Location: [facility];  Service: Gynecology;  Laterality: N/A;  cpt 57100  one hour  . PUBOVAGINAL SLING  02/20/2011   Procedure: Gaynelle Arabian;  Surgeon: Ailene Rud, MD;  Location: [facility];  Service: Urology;  Laterality:  N/A;  . RECTOCELE REPAIR N/A 12/19/2016   Procedure: POSTERIOR REPAIR (RECTOCELE);  Surgeon: Nunzio Cobbs, MD;  Location: [facility];  Service: Gynecology;  Laterality: N/A;  1.25 hours  . SALPINGOOPHORECTOMY  02/20/2011   Procedure: SALPINGO OOPHERECTOMY;  Surgeon: Bennetta Laos, MD;  Location: [facility];  Service: Gynecology;  Laterality: Bilateral;  . UPPER GASTROINTESTINAL ENDOSCOPY  06/2011  . VENTRAL HERNIA REPAIR  05/24/2011   Procedure: LAPAROSCOPIC VENTRAL HERNIA;  Surgeon: Pedro Earls, MD;  Location: [facility];  Service: General;  Laterality: N/A;    Family  History  Problem Relation Age of Onset  . Breast cancer Mother        67's  . Hypertension Mother   . Thyroid disease Mother        hypothyroid  . Cancer Father        LUNG  . Thyroid disease Maternal Grandmother        hypothyroid    Social History Social History   Tobacco Use  . Smoking status: Never Smoker  . Smokeless tobacco: Never Used  Substance Use Topics  . Alcohol use: No    Alcohol/week: 0.0 standard drinks    Comment: social  . Drug use: No    Current Outpatient Medications  Medication Sig Dispense Refill  . cetirizine (ZYRTEC) 10 MG tablet Take 10 mg by mouth daily.     . clonazePAM (KLONOPIN) 2 MG tablet Take 1 tablet by mouth as needed.    . cyanocobalamin 2000 MCG tablet Take 2,500 mcg by mouth daily.    Marland Kitchen EPINEPHrine (EPI-PEN) 0.3 mg/0.3 mL DEVI Inject into the muscle as needed.    Marland Kitchen estradiol (ESTRACE) 0.1 MG/GM vaginal cream Use 1/2 gram per vaginal at hs twice weekly. 42.5 g 0  . gabapentin (NEURONTIN) 100 MG capsule TAKE (2) CAPSULES BY MOUTH TWICE DAILY. 120 capsule 3  . hydrOXYzine (ATARAX/VISTARIL) 50 MG tablet Take 1 tablet by mouth 2 (two) times daily.    . hyoscyamine (LEVSIN SL) 0.125 MG SL tablet Place 1 tablet under the tongue as needed.    . insulin aspart (NOVOLOG) cartridge Inject into the skin 3 (three) times daily with meals. Patient has insulin pump    . losartan (COZAAR) 25 MG tablet Take 2 tablets by mouth daily.    . montelukast (SINGULAIR) 10 MG tablet Take 10 mg by mouth at bedtime.     . NONFORMULARY OR COMPOUNDED ITEM Testosterone propionate 2% in white petrolatum, apply bid for 6 weeks and then daily as directed.  60 grams. 60 each 1  . ondansetron (ZOFRAN-ODT) 4 MG disintegrating tablet Take 1 tablet by mouth as needed.    Marland Kitchen PRESCRIPTION MEDICATION Take 1 tablet by mouth 2 (two) times daily. Ketotifen 5mg  tablet, [place] the [place]. For mast cell problem    . ranitidine (ZANTAC) 300 MG tablet Take 300 mg by mouth daily.     . Semaglutide, 1 MG/DOSE, (OZEMPIC, 1 MG/DOSE,) 2 MG/1.5ML SOPN     . SYNTHROID 150 MCG tablet Take 1 tablet by mouth daily.    Marland Kitchen UNABLE TO FIND 2 capsules 2 (two) times daily. Med Name: AlgaeCal Plus    . VASCEPA 1 g CAPS Take 1 capsule by mouth 2 (two) times daily.    . vitamin C (ASCORBIC ACID) 500 MG tablet Take 500 mg by mouth daily.    . Vitamin D, Ergocalciferol, (DRISDOL) 50000 units CAPS capsule Take 50,000 Units by mouth every 7 (seven)  days.    . zolmitriptan (ZOMIG) 5 MG tablet Take 5 mg by mouth as needed for migraine.    . predniSONE (DELTASONE) 10 MG tablet      No current facility-administered medications for this visit.     Allergies  Allergen Reactions  . Meperidine Other (See Comments)  . Demerol Nausea Only    Review of Systems   Recently sustained a nondisplaced fracture of the sacrum and is currently recovering from that now Patient noted to have a soft cardiac flow murmur by her cardiologist which did not warrant echocardiogram. Has her blood pressure checked at least once every 2 to 3 months. No history of syncope, leg edema, palpitations  BP 140/88 (BP Location: Right Arm, Patient Position: Sitting, Cuff Size: Normal)   Pulse 82   Resp 18   Ht 5\' 4"  (1.626 m)   Wt 185 lb 12.8 oz (84.3 kg)   LMP  (LMP Unknown)   SpO2 95% Comment: RA  BMI 31.89 kg/m  Physical Exam      Exam    General- alert and comfortable    Neck- no JVD, no cervical adenopathy palpable, no carotid bruit   Lungs- clear without rales, wheezes   Cor- regular rate and rhythm, no murmur , gallop   Abdomen- soft, non-tender   Extremities - warm, non-tender, minimal edema   Neuro- oriented, appropriate, no focal weakness   Diagnostic Tests: CT scan images from 2019 compared with CT scan of chest performed [facility] at [facility].  The aortic diameter has not significantly changed and remains at approximately 4.1 cm.  Impression: Very minimal dilatation of the ascending thoracic  aneurysm which has been stable over more than a decade.  Very low risk for aortic dissection.  We discussed the best long-term approach to this situation he has blood pressure control, frequent blood pressure measurements and goal of keeping systolic blood pressure less than 140.  She also understands she should not undergo heavy weight lifting.  There is also evidence that she should avoid the fluoroquinolone antibiotic group especially Levaquin which can weaken the aortic wall.  All this information was reviewed with the patient.  I have recommended to the patient that annual CT scans to follow this aorta would be of no benefit and would expose her to more radiation.  I would recommend a repeat CT scan in approximately 3 years.  We will relay this to her primary physician.  With her blood pressure controlled and a coronary calcium score of 0 she should be expecting good cardiac health for several years.  Plan: Return as needed.  No thoracic surgery issues at this time. Repeat chest CT scan 2022.  Len Childs, MD [facility] [phone]

## 2018-01-30 DIAGNOSIS — M316 Other giant cell arteritis: Secondary | ICD-10-CM | POA: Diagnosis not present

## 2018-01-30 DIAGNOSIS — M7062 Trochanteric bursitis, left hip: Secondary | ICD-10-CM | POA: Diagnosis not present

## 2018-01-30 DIAGNOSIS — M2550 Pain in unspecified joint: Secondary | ICD-10-CM | POA: Diagnosis not present

## 2018-01-30 DIAGNOSIS — M353 Polymyalgia rheumatica: Secondary | ICD-10-CM | POA: Diagnosis not present

## 2018-01-30 DIAGNOSIS — M7061 Trochanteric bursitis, right hip: Secondary | ICD-10-CM | POA: Diagnosis not present

## 2018-01-30 DIAGNOSIS — Z7952 Long term (current) use of systemic steroids: Secondary | ICD-10-CM | POA: Diagnosis not present

## 2018-02-08 ENCOUNTER — Ambulatory Visit: Payer: Self-pay | Admitting: Surgery

## 2018-02-14 ENCOUNTER — Ambulatory Visit: Payer: Self-pay

## 2018-02-15 ENCOUNTER — Encounter (HOSPITAL_BASED_OUTPATIENT_CLINIC_OR_DEPARTMENT_OTHER): Payer: Self-pay | Admitting: *Deleted

## 2018-02-15 ENCOUNTER — Other Ambulatory Visit: Payer: Self-pay

## 2018-02-15 NOTE — Progress Notes (Signed)
Pt has insulin pump, diabetic educator Harvel Ricks made aware, okay to leave on for surgical case

## 2018-02-18 ENCOUNTER — Other Ambulatory Visit: Payer: Medicare Other

## 2018-02-18 ENCOUNTER — Other Ambulatory Visit: Payer: Self-pay | Admitting: Obstetrics and Gynecology

## 2018-02-18 ENCOUNTER — Telehealth: Payer: Self-pay | Admitting: Obstetrics and Gynecology

## 2018-02-18 DIAGNOSIS — R6882 Decreased libido: Secondary | ICD-10-CM

## 2018-02-18 DIAGNOSIS — R7989 Other specified abnormal findings of blood chemistry: Secondary | ICD-10-CM

## 2018-02-18 NOTE — Telephone Encounter (Signed)
Patient called and cancelled her appointment for labs today. She will call back to reschedule at a later date.

## 2018-02-18 NOTE — Telephone Encounter (Signed)
Thank you for the update!

## 2018-02-20 ENCOUNTER — Encounter (HOSPITAL_BASED_OUTPATIENT_CLINIC_OR_DEPARTMENT_OTHER)
Admission: RE | Admit: 2018-02-20 | Discharge: 2018-02-20 | Disposition: A | Discharge status: Home / Self Care | Payer: Medicare Other | Source: Ambulatory Visit | Attending: Surgery | Admitting: Surgery

## 2018-02-20 DIAGNOSIS — Z885 Allergy status to narcotic agent status: Secondary | ICD-10-CM | POA: Diagnosis not present

## 2018-02-20 DIAGNOSIS — N182 Chronic kidney disease, stage 2 (mild): Secondary | ICD-10-CM | POA: Diagnosis not present

## 2018-02-20 DIAGNOSIS — R220 Localized swelling, mass and lump, head: Secondary | ICD-10-CM

## 2018-02-20 DIAGNOSIS — Z01818 Encounter for other preprocedural examination: Secondary | ICD-10-CM | POA: Insufficient documentation

## 2018-02-20 DIAGNOSIS — E039 Hypothyroidism, unspecified: Secondary | ICD-10-CM | POA: Diagnosis not present

## 2018-02-20 DIAGNOSIS — D4701 Cutaneous mastocytosis: Secondary | ICD-10-CM | POA: Diagnosis not present

## 2018-02-20 DIAGNOSIS — Z79899 Other long term (current) drug therapy: Secondary | ICD-10-CM | POA: Diagnosis not present

## 2018-02-20 DIAGNOSIS — Z6830 Body mass index (BMI) 30.0-30.9, adult: Secondary | ICD-10-CM | POA: Diagnosis not present

## 2018-02-20 DIAGNOSIS — Z90710 Acquired absence of both cervix and uterus: Secondary | ICD-10-CM | POA: Diagnosis not present

## 2018-02-20 DIAGNOSIS — M353 Polymyalgia rheumatica: Secondary | ICD-10-CM | POA: Diagnosis not present

## 2018-02-20 DIAGNOSIS — E669 Obesity, unspecified: Secondary | ICD-10-CM | POA: Diagnosis not present

## 2018-02-20 DIAGNOSIS — M8580 Other specified disorders of bone density and structure, unspecified site: Secondary | ICD-10-CM | POA: Diagnosis not present

## 2018-02-20 DIAGNOSIS — Z538 Procedure and treatment not carried out for other reasons: Secondary | ICD-10-CM | POA: Diagnosis not present

## 2018-02-20 DIAGNOSIS — Z888 Allergy status to other drugs, medicaments and biological substances status: Secondary | ICD-10-CM | POA: Diagnosis not present

## 2018-02-20 DIAGNOSIS — D1739 Benign lipomatous neoplasm of skin and subcutaneous tissue of other sites: Secondary | ICD-10-CM | POA: Diagnosis not present

## 2018-02-20 DIAGNOSIS — Z9884 Bariatric surgery status: Secondary | ICD-10-CM | POA: Diagnosis not present

## 2018-02-20 DIAGNOSIS — E1122 Type 2 diabetes mellitus with diabetic chronic kidney disease: Secondary | ICD-10-CM | POA: Diagnosis not present

## 2018-02-20 DIAGNOSIS — Z794 Long term (current) use of insulin: Secondary | ICD-10-CM | POA: Diagnosis not present

## 2018-02-20 DIAGNOSIS — M1990 Unspecified osteoarthritis, unspecified site: Secondary | ICD-10-CM | POA: Diagnosis not present

## 2018-02-20 DIAGNOSIS — R011 Cardiac murmur, unspecified: Secondary | ICD-10-CM | POA: Diagnosis not present

## 2018-02-20 LAB — BASIC METABOLIC PANEL
ANION GAP: 9 (ref 5–15)
BUN: 18 mg/dL (ref 8–23)
CALCIUM: 9.2 mg/dL (ref 8.9–10.3)
CHLORIDE: 108 mmol/L (ref 98–111)
CO2: 25 mmol/L (ref 22–32)
CREATININE: 1.28 mg/dL — AB (ref 0.44–1.00)
GFR calc non Af Amer: 43 mL/min — ABNORMAL LOW (ref >60)
GFR, EST AFRICAN AMERICAN: 49 mL/min — AB (ref >60)
GLUCOSE: 86 mg/dL (ref 70–99)
Potassium: 3.4 mmol/L — ABNORMAL LOW (ref 3.5–5.1)
Sodium: 142 mmol/L (ref 135–145)

## 2018-02-20 NOTE — H&P (Signed)
Chief Complaint:  Mass behind the left ear  History of Present Illness:  Gabrielle Lynch is an 67 y.o. female who has had a soft mass ~2 cm in diameter behind her left ear for a couple of years.  She had been treated with steroids for polymyalgia rheumatica.  She would like this removed.    Past Medical History:  Diagnosis Date  . Abnormal Pap smear of cervix    Hx of cryotherapy to cervix in her 47s  . Abnormal uterine bleeding    history of fibroids  . Anemia   . Chronic kidney disease    stage 2  . Diabetes mellitus without complication ([place])    type 2 diagnosed 3 years ago  . DJD (degenerative joint disease)   . Fibroid   . Heart murmur    functional  . History of hypertension NO MEDS SINCE LAP GASTRIC BAND 2010 --  WT. LOSS  . Hypertension   . Hypothyroidism   . Low testosterone level in female 2019  . 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  STATES SHE CARRIES EPI PEN--BUT HAS NOT HAD ANY RECENT EPISODES.  STATES MANY OF THE MEDICATIONS SHE TAKES ARE TO HELP THIS PROBLEM - INCLUDING  Ketotifen-mast cell inhibitor-not approved in [place].   . Migraine   . 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 Osteopenia   . Other fracture of sacrum, initial encounter for closed fracture ([place]) 06/2017  . PMR (polymyalgia rheumatica) (H[facility])   . PONV (postoperative nausea and vomiting)   . Spondylosis     Past Surgical History:  Procedure Laterality Date  . ABDOMINAL HYSTERECTOMY  02/20/2011   Procedure: HYSTERECTOMY ABDOMINAL;  Surgeon: Bennetta Laos, MD;  Location: [facility];  Service: Gynecology;  Laterality: N/A;  . BREAST BIOPSY    . CERVICAL BIOPSY  W/ LOOP ELECTRODE EXCISION  1992  . CERVIX LESION DESTRUCTION    . [place]   X2  . CHOLECYSTECTOMY    . COLONOSCOPY    . DILATION AND CURETTAGE OF UTERUS     heavy menses  . HYSTEROSCOPY   06/1995   HYSTEROSCOPIC MYOMECTOMY  . HYSTEROSCOPY  06/1998   D&C, HYSTEROSCOPY  . KNEE SURGERY     X2 right knee  . LAPAROSCOPIC GASTRIC BANDING  08-03-2008   [facility] APS SYSTEM  . LESION REMOVAL N/A 09/02/2012   Procedure: EXCISION VAGINAL LESION;  Surgeon: Terrance Mass, MD;  Location: [facility];  Service: Gynecology;  Laterality: N/A;  cpt 57100  one hour  . PUBOVAGINAL SLING  02/20/2011   Procedure: Gaynelle Arabian;  Surgeon: Ailene Rud, MD;  Location: [facility];  Service: Urology;  Laterality: N/A;  . RECTOCELE REPAIR N/A 12/19/2016   Procedure: POSTERIOR REPAIR (RECTOCELE);  Surgeon: Nunzio Cobbs, MD;  Location: [facility];  Service: Gynecology;  Laterality: N/A;  1.25 hours  . SALPINGOOPHORECTOMY  02/20/2011   Procedure: SALPINGO OOPHERECTOMY;  Surgeon: Bennetta Laos, MD;  Location: [facility];  Service: Gynecology;  Laterality: Bilateral;  . UPPER GASTROINTESTINAL ENDOSCOPY  06/2011  . VENTRAL HERNIA REPAIR  05/24/2011   Procedure: LAPAROSCOPIC VENTRAL HERNIA;  Surgeon: Pedro Earls, MD;  Location: [facility];  Service: General;  Laterality: N/A;    No current facility-administered medications for this encounter.    Current Outpatient Medications  Medication Sig Dispense Refill  . cetirizine (ZYRTEC) 10 MG tablet Take 10 mg by mouth daily.     . clonazePAM (KLONOPIN) 2 MG tablet Take 1 tablet by mouth as needed.    . cyanocobalamin 2000 MCG tablet Take 2,500 mcg by mouth daily.    Marland Kitchen estradiol (ESTRACE) 0.1 MG/GM vaginal cream Use 1/2 gram per vaginal at hs twice weekly. 42.5 g 0  . famotidine (PEPCID) 10 MG tablet Take 10 mg by mouth 2 (two) times daily.    Marland Kitchen gabapentin (NEURONTIN) 100 MG capsule TAKE (2) CAPSULES BY MOUTH TWICE DAILY. 120 capsule 3  . hydrOXYzine (ATARAX/VISTARIL) 50 MG tablet Take 1 tablet by mouth 2 (two) times daily.    . hyoscyamine (LEVSIN SL) 0.125 MG SL tablet Place 1 tablet under the tongue as needed.    .  insulin aspart (NOVOLOG) cartridge Inject into the skin 3 (three) times daily with meals. Patient has insulin pump    . KETOTIFEN FUMARATE OP Apply to eye.    . losartan (COZAAR) 25 MG tablet Take 2 tablets by mouth daily.    . montelukast (SINGULAIR) 10 MG tablet Take 10 mg by mouth at bedtime.     . NONFORMULARY OR COMPOUNDED ITEM Testosterone propionate 2% in white petrolatum, apply bid for 6 weeks and then daily as directed.  60 grams. 60 each 1  . predniSONE (DELTASONE) 10 MG tablet     . PRESCRIPTION MEDICATION Take 1 tablet by mouth 2 (two) times daily. Ketotifen 49m tablet, [place]. For mast cell problem    . ranitidine (ZANTAC) 300 MG tablet Take 300 mg by mouth daily.    . Semaglutide, 1 MG/DOSE, (OZEMPIC, 1 MG/DOSE,) 2 MG/1.5ML SOPN     . SYNTHROID 150 MCG tablet Take 1 tablet by mouth daily.    .Marland Kitchentopiramate (TOPAMAX) 50 MG tablet Take 50 mg by mouth 2 (two) times daily.    .Marland KitchenUNABLE TO FIND 2 capsules 2 (two) times daily. Med Name: AlgaeCal Plus    . valsartan (DIOVAN) 80 MG tablet Take 80 mg by mouth daily.    .Marland KitchenVASCEPA 1 g CAPS Take 1 capsule by mouth 2 (two) times daily.    . vitamin C (ASCORBIC ACID) 500 MG tablet Take 500 mg by mouth daily.    . Vitamin D, Ergocalciferol, (DRISDOL) 50000 units CAPS capsule Take 50,000 Units by mouth every 7 (seven) days.    .Marland KitchenEPINEPHrine (EPI-PEN) 0.3 mg/0.3 mL DEVI Inject into the muscle as needed.    . ondansetron (ZOFRAN-ODT) 4 MG disintegrating tablet Take 1 tablet by mouth as needed.    . zolmitriptan (ZOMIG) 5 MG tablet Take 5 mg by mouth as needed for migraine.     Meperidine and Demerol Family History  Problem Relation Age of Onset  . Breast cancer Mother        677's . Hypertension Mother   . Thyroid disease Mother        hypothyroid  . Cancer Father        LUNG  . Thyroid disease Maternal Grandmother        hypothyroid   Social History:   reports that she has never smoked. She has never used smokeless  tobacco. She reports that she does not drink alcohol or use drugs.   REVIEW OF SYSTEMS : Negative except for see problem list  Physical Exam:   Height 5' 4" (1.626 m), weight 83.5 kg. Body mass index is  31.58 kg/m.  Gen:  WDWN WF NAD  Neurological: Alert and oriented to person, place, and time. Motor and sensory function is grossly intact  Head: Normocephalic and atraumatic. 2 cm soft rubbery mass behind the left ear.   Eyes: Conjunctivae are normal. Pupils are equal, round, and reactive to light. No scleral icterus.  Neck: Normal range of motion. Neck supple. No tracheal deviation or thyromegaly present.  Cardiovascular:  SR without murmurs or gallops.  No carotid bruits Breast:  Not examined Respiratory: Effort normal.  No respiratory distress. No chest wall tenderness. Breath sounds normal.  No wheezes, rales or rhonchi.  Abdomen:  lapband port in the right upper quadrant GU:  Not examined Musculoskeletal: Normal range of motion. Extremities are nontender. No cyanosis, edema or clubbing noted Lymphadenopathy: No cervical, preauricular, postauricular or axillary adenopathy is present Skin: Skin is warm and dry. No rash noted. No diaphoresis. No erythema. No pallor. Pscyh: Normal mood and affect. Behavior is normal. Judgment and thought content normal.   LABORATORY RESULTS: Results for orders placed or performed during the hospital encounter of 02/22/18 (from the past 48 hour(s))  Basic metabolic panel     Status: Abnormal   Collection Time: 02/20/18 12:00 PM  Result Value Ref Range   Sodium 142 135 - 145 mmol/L   Potassium 3.4 (L) 3.5 - 5.1 mmol/L   Chloride 108 98 - 111 mmol/L   CO2 25 22 - 32 mmol/L   Glucose, Bld 86 70 - 99 mg/dL   BUN 18 8 - 23 mg/dL   Creatinine, Ser 1.28 (H) 0.44 - 1.00 mg/dL   Calcium 9.2 8.9 - 10.3 mg/dL   GFR calc non Af Amer 43 (L) >60 mL/min   GFR calc Af Amer 49 (L) >60 mL/min    Comment: (NOTE) The eGFR has been calculated using the CKD EPI  equation. This calculation has not been validated in all clinical situations. eGFR's persistently <60 mL/min signify possible Chronic Kidney Disease.    Anion gap 9 5 - 15    Comment: Performed at [facility] [address], [place], [place] [address]     RADIOLOGY RESULTS: No results found.  Problem List: Patient Active Problem List   Diagnosis Date Noted  . Murmur 04/14/2017  . Postmenopausal HRT (hormone replacement therapy) 10/29/2014  . Hot flashes, menopausal 10/29/2014  . Fitting and adjustment of gastric lap band 01/14/2013  . Obese 01/14/2013  . H/O vitamin D deficiency 08/09/2012  . Unspecified hypothyroidism 08/09/2012  . Menopause 08/09/2012  . Postoperative seroma-in ventral hernia repair site 07/20/2011  . Lapband APS April 2010 07/20/2011  . Hypertension   . Mast cell disorder   . DEGENERATIVE JOINT DISEASE 02/19/2008  . SPONDYLOSIS UNSPEC SITE W/O MENTION MYELOPATHY 02/19/2008  . SINUS TARSI SYNDROME 02/19/2008  . UNEQUAL LEG LENGTH 02/19/2008    Assessment & Plan: Lipomatous mass behind the left ear.      Matt B. Hassell Done, MD, [facility] [phone] beeper [phone]  02/20/2018 1:29 PM

## 2018-02-20 NOTE — Progress Notes (Signed)
Patient had BMET drawn today at [facility] and was given pre surgery ensure drink along with instructions on when to drink it.  Patient verbalized understanding of instructions and had no questions.

## 2018-02-21 DIAGNOSIS — M461 Sacroiliitis, not elsewhere classified: Secondary | ICD-10-CM | POA: Diagnosis not present

## 2018-02-21 DIAGNOSIS — M7062 Trochanteric bursitis, left hip: Secondary | ICD-10-CM | POA: Diagnosis not present

## 2018-02-21 NOTE — Progress Notes (Signed)
Dr. Eligha Bridegroom notified of Potassium result of 3.4 on 02/20/18. Chart reviewed by Dr. Eligha Bridegroom. No new orders at this time. Ok to proceed with surgery.

## 2018-02-22 ENCOUNTER — Ambulatory Visit (HOSPITAL_BASED_OUTPATIENT_CLINIC_OR_DEPARTMENT_OTHER): Payer: Medicare Other | Admitting: Anesthesiology

## 2018-02-22 ENCOUNTER — Encounter (HOSPITAL_BASED_OUTPATIENT_CLINIC_OR_DEPARTMENT_OTHER): Payer: Self-pay | Admitting: Anesthesiology

## 2018-02-22 ENCOUNTER — Ambulatory Visit (HOSPITAL_BASED_OUTPATIENT_CLINIC_OR_DEPARTMENT_OTHER)
Admission: RE | Admit: 2018-02-22 | Discharge: 2018-02-22 | Disposition: A | Discharge status: Home / Self Care | Payer: Medicare Other | Source: Ambulatory Visit | Attending: Surgery | Admitting: Surgery

## 2018-02-22 ENCOUNTER — Other Ambulatory Visit: Payer: Self-pay

## 2018-02-22 ENCOUNTER — Encounter (HOSPITAL_BASED_OUTPATIENT_CLINIC_OR_DEPARTMENT_OTHER)
Admission: RE | Disposition: A | Discharge status: Home / Self Care | Payer: Self-pay | Source: Ambulatory Visit | Attending: Surgery

## 2018-02-22 DIAGNOSIS — N182 Chronic kidney disease, stage 2 (mild): Secondary | ICD-10-CM | POA: Insufficient documentation

## 2018-02-22 DIAGNOSIS — Z885 Allergy status to narcotic agent status: Secondary | ICD-10-CM | POA: Insufficient documentation

## 2018-02-22 DIAGNOSIS — Z538 Procedure and treatment not carried out for other reasons: Secondary | ICD-10-CM | POA: Diagnosis not present

## 2018-02-22 DIAGNOSIS — Z90710 Acquired absence of both cervix and uterus: Secondary | ICD-10-CM | POA: Insufficient documentation

## 2018-02-22 DIAGNOSIS — Z794 Long term (current) use of insulin: Secondary | ICD-10-CM | POA: Insufficient documentation

## 2018-02-22 DIAGNOSIS — Z888 Allergy status to other drugs, medicaments and biological substances status: Secondary | ICD-10-CM | POA: Insufficient documentation

## 2018-02-22 DIAGNOSIS — M1990 Unspecified osteoarthritis, unspecified site: Secondary | ICD-10-CM | POA: Diagnosis not present

## 2018-02-22 DIAGNOSIS — E669 Obesity, unspecified: Secondary | ICD-10-CM | POA: Insufficient documentation

## 2018-02-22 DIAGNOSIS — E1122 Type 2 diabetes mellitus with diabetic chronic kidney disease: Secondary | ICD-10-CM | POA: Insufficient documentation

## 2018-02-22 DIAGNOSIS — D4701 Cutaneous mastocytosis: Secondary | ICD-10-CM | POA: Insufficient documentation

## 2018-02-22 DIAGNOSIS — M353 Polymyalgia rheumatica: Secondary | ICD-10-CM | POA: Insufficient documentation

## 2018-02-22 DIAGNOSIS — Z6830 Body mass index (BMI) 30.0-30.9, adult: Secondary | ICD-10-CM | POA: Insufficient documentation

## 2018-02-22 DIAGNOSIS — Z9884 Bariatric surgery status: Secondary | ICD-10-CM | POA: Insufficient documentation

## 2018-02-22 DIAGNOSIS — D1739 Benign lipomatous neoplasm of skin and subcutaneous tissue of other sites: Secondary | ICD-10-CM | POA: Diagnosis not present

## 2018-02-22 DIAGNOSIS — R590 Localized enlarged lymph nodes: Secondary | ICD-10-CM | POA: Diagnosis not present

## 2018-02-22 DIAGNOSIS — R011 Cardiac murmur, unspecified: Secondary | ICD-10-CM | POA: Insufficient documentation

## 2018-02-22 DIAGNOSIS — Z79899 Other long term (current) drug therapy: Secondary | ICD-10-CM | POA: Insufficient documentation

## 2018-02-22 DIAGNOSIS — M8580 Other specified disorders of bone density and structure, unspecified site: Secondary | ICD-10-CM | POA: Insufficient documentation

## 2018-02-22 DIAGNOSIS — E039 Hypothyroidism, unspecified: Secondary | ICD-10-CM | POA: Insufficient documentation

## 2018-02-22 HISTORY — DX: Chronic kidney disease, unspecified: N18.9

## 2018-02-22 HISTORY — PX: WOUND EXPLORATION: SHX6188

## 2018-02-22 LAB — GLUCOSE, CAPILLARY
GLUCOSE-CAPILLARY: 267 mg/dL — AB (ref 70–99)
GLUCOSE-CAPILLARY: 78 mg/dL (ref 70–99)

## 2018-02-22 SURGERY — WOUND EXPLORATION
Anesthesia: Monitor Anesthesia Care | Site: Head | Laterality: Left

## 2018-02-22 MED ORDER — SCOPOLAMINE 1 MG/3DAYS TD PT72: 1.0000 | MEDICATED_PATCH | Freq: Once | TRANSDERMAL | Status: DC | PRN

## 2018-02-22 MED ORDER — LIDOCAINE HCL (PF) 1 % IJ SOLN
INTRAMUSCULAR | Status: AC
  Filled 2018-02-22: qty 30

## 2018-02-22 MED ORDER — MIDAZOLAM HCL 2 MG/2ML IJ SOLN
INTRAMUSCULAR | Status: AC
  Filled 2018-02-22: qty 2

## 2018-02-22 MED ORDER — ACETAMINOPHEN 325 MG PO TABS: 325.0000 mg | ORAL_TABLET | ORAL | Status: DC | PRN

## 2018-02-22 MED ORDER — ACETAMINOPHEN 160 MG/5ML PO SOLN: 325.0000 mg | ORAL | Status: DC | PRN

## 2018-02-22 MED ORDER — CEFAZOLIN SODIUM-DEXTROSE 2-4 GM/100ML-% IV SOLN
2.0000 g | INTRAVENOUS | Status: AC
  Administered 2018-02-22: 2 g via INTRAVENOUS

## 2018-02-22 MED ORDER — SCOPOLAMINE 1 MG/3DAYS TD PT72
MEDICATED_PATCH | TRANSDERMAL | Status: AC
  Filled 2018-02-22: qty 1

## 2018-02-22 MED ORDER — FENTANYL CITRATE (PF) 100 MCG/2ML IJ SOLN
INTRAMUSCULAR | Status: AC
  Filled 2018-02-22: qty 2

## 2018-02-22 MED ORDER — ONDANSETRON HCL 4 MG/2ML IJ SOLN
INTRAMUSCULAR | Status: DC | PRN
  Administered 2018-02-22: 4 mg via INTRAVENOUS

## 2018-02-22 MED ORDER — EPHEDRINE SULFATE 50 MG/ML IJ SOLN
INTRAMUSCULAR | Status: DC | PRN
  Administered 2018-02-22 (×3): 10 mg via INTRAVENOUS

## 2018-02-22 MED ORDER — OXYCODONE HCL 5 MG/5ML PO SOLN: 5.0000 mg | Freq: Once | ORAL | Status: DC | PRN

## 2018-02-22 MED ORDER — HEPARIN SODIUM (PORCINE) 5000 UNIT/ML IJ SOLN
INTRAMUSCULAR | Status: AC
  Filled 2018-02-22: qty 1

## 2018-02-22 MED ORDER — BUPIVACAINE HCL (PF) 0.5 % IJ SOLN
INTRAMUSCULAR | Status: DC | PRN
  Administered 2018-02-22: 3 mL

## 2018-02-22 MED ORDER — CHLORHEXIDINE GLUCONATE CLOTH 2 % EX PADS: 6.0000 | MEDICATED_PAD | Freq: Once | CUTANEOUS | Status: DC

## 2018-02-22 MED ORDER — BUPIVACAINE HCL (PF) 0.5 % IJ SOLN
INTRAMUSCULAR | Status: AC
  Filled 2018-02-22: qty 30

## 2018-02-22 MED ORDER — CEFAZOLIN SODIUM-DEXTROSE 2-4 GM/100ML-% IV SOLN
INTRAVENOUS | Status: AC
  Filled 2018-02-22: qty 100

## 2018-02-22 MED ORDER — SCOPOLAMINE 1 MG/3DAYS TD PT72
MEDICATED_PATCH | TRANSDERMAL | Status: DC | PRN
  Administered 2018-02-22: 1 via TRANSDERMAL

## 2018-02-22 MED ORDER — MEPERIDINE HCL 25 MG/ML IJ SOLN: 6.2500 mg | INTRAMUSCULAR | Status: DC | PRN

## 2018-02-22 MED ORDER — PHENYLEPHRINE HCL 10 MG/ML IJ SOLN
INTRAMUSCULAR | Status: DC | PRN
  Administered 2018-02-22: 80 ug via INTRAVENOUS

## 2018-02-22 MED ORDER — LIDOCAINE HCL (CARDIAC) PF 100 MG/5ML IV SOSY
PREFILLED_SYRINGE | INTRAVENOUS | Status: DC | PRN
  Administered 2018-02-22: 30 mg via INTRAVENOUS

## 2018-02-22 MED ORDER — PROPOFOL 500 MG/50ML IV EMUL
INTRAVENOUS | Status: DC | PRN
  Administered 2018-02-22: 25 ug/kg/min via INTRAVENOUS

## 2018-02-22 MED ORDER — SODIUM BICARBONATE 4 % IV SOLN
INTRAVENOUS | Status: AC
  Filled 2018-02-22: qty 5

## 2018-02-22 MED ORDER — MIDAZOLAM HCL 5 MG/5ML IJ SOLN
INTRAMUSCULAR | Status: DC | PRN
  Administered 2018-02-22: 2 mg via INTRAVENOUS

## 2018-02-22 MED ORDER — OXYCODONE HCL 5 MG PO TABS: 5.0000 mg | ORAL_TABLET | Freq: Once | ORAL | Status: DC | PRN

## 2018-02-22 MED ORDER — SODIUM BICARBONATE 4 % IV SOLN
INTRAVENOUS | Status: DC | PRN
  Administered 2018-02-22: 1 mL via INTRAVENOUS

## 2018-02-22 MED ORDER — FENTANYL CITRATE (PF) 100 MCG/2ML IJ SOLN: 25.0000 ug | INTRAMUSCULAR | Status: DC | PRN

## 2018-02-22 MED ORDER — LIDOCAINE-EPINEPHRINE (PF) 1 %-1:200000 IJ SOLN
INTRAMUSCULAR | Status: AC
  Filled 2018-02-22: qty 30

## 2018-02-22 MED ORDER — LACTATED RINGERS IV SOLN
INTRAVENOUS | Status: DC
  Administered 2018-02-22 (×2): via INTRAVENOUS

## 2018-02-22 MED ORDER — HEPARIN SODIUM (PORCINE) 5000 UNIT/ML IJ SOLN
5000.0000 [IU] | Freq: Once | INTRAMUSCULAR | Status: AC
  Administered 2018-02-22: 5000 [IU] via SUBCUTANEOUS

## 2018-02-22 MED ORDER — PROPOFOL 10 MG/ML IV BOLUS
INTRAVENOUS | Status: DC | PRN
  Administered 2018-02-22: 160 mg via INTRAVENOUS

## 2018-02-22 MED ORDER — DEXAMETHASONE SODIUM PHOSPHATE 4 MG/ML IJ SOLN
INTRAMUSCULAR | Status: DC | PRN
  Administered 2018-02-22: 10 mg via INTRAVENOUS

## 2018-02-22 MED ORDER — HYDROCODONE-ACETAMINOPHEN 5-325 MG PO TABS: 1.0000 | ORAL_TABLET | Freq: Four times a day (QID) | ORAL | 0 refills | Status: DC | PRN

## 2018-02-22 MED ORDER — FENTANYL CITRATE (PF) 100 MCG/2ML IJ SOLN
INTRAMUSCULAR | Status: DC | PRN
  Administered 2018-02-22 (×2): 50 ug via INTRAVENOUS

## 2018-02-22 MED ORDER — PROPOFOL 10 MG/ML IV BOLUS
INTRAVENOUS | Status: AC
  Filled 2018-02-22: qty 20

## 2018-02-22 MED ORDER — PROPOFOL 500 MG/50ML IV EMUL
INTRAVENOUS | Status: AC
  Filled 2018-02-22: qty 50

## 2018-02-22 MED ORDER — FENTANYL CITRATE (PF) 100 MCG/2ML IJ SOLN: 50.0000 ug | INTRAMUSCULAR | Status: DC | PRN

## 2018-02-22 MED ORDER — ONDANSETRON HCL 4 MG/2ML IJ SOLN: 4.0000 mg | Freq: Once | INTRAMUSCULAR | Status: DC | PRN

## 2018-02-22 MED ORDER — MIDAZOLAM HCL 2 MG/2ML IJ SOLN: 1.0000 mg | INTRAMUSCULAR | Status: DC | PRN

## 2018-02-22 SURGICAL SUPPLY — 55 items
ADH SKN CLS APL DERMABOND .7 (GAUZE/BANDAGES/DRESSINGS) ×2
APL SKNCLS STERI-STRIP NONHPOA (GAUZE/BANDAGES/DRESSINGS)
BENZOIN TINCTURE PRP APPL 2/3 (GAUZE/BANDAGES/DRESSINGS) IMPLANT
BLADE CLIPPER SURG (BLADE) IMPLANT
BLADE SURG 15 STRL LF DISP TIS (BLADE) ×2 IMPLANT
BLADE SURG 15 STRL SS (BLADE) ×4
CANISTER SUCT 1200ML W/VALVE (MISCELLANEOUS) IMPLANT
CLEANER CAUTERY TIP 5X5 PAD (MISCELLANEOUS) ×1 IMPLANT
CLOSURE WOUND 1/2 X4 (GAUZE/BANDAGES/DRESSINGS)
COVER BACK TABLE 60X90IN (DRAPES) ×4 IMPLANT
COVER MAYO STAND STRL (DRAPES) ×4 IMPLANT
COVER WAND RF STERILE (DRAPES) IMPLANT
DECANTER SPIKE VIAL GLASS SM (MISCELLANEOUS) ×4 IMPLANT
DERMABOND ADVANCED (GAUZE/BANDAGES/DRESSINGS) ×2
DERMABOND ADVANCED .7 DNX12 (GAUZE/BANDAGES/DRESSINGS) ×1 IMPLANT
DRAPE LAPAROTOMY 100X72 PEDS (DRAPES) IMPLANT
DRAPE U-SHAPE 76X120 STRL (DRAPES) ×3 IMPLANT
ELECT REM PT RETURN 9FT ADLT (ELECTROSURGICAL) ×4
ELECTRODE REM PT RTRN 9FT ADLT (ELECTROSURGICAL) ×2 IMPLANT
GAUZE SPONGE 4X4 12PLY STRL LF (GAUZE/BANDAGES/DRESSINGS) ×4 IMPLANT
GLOVE BIO SURGEON STRL SZ 6.5 (GLOVE) ×2 IMPLANT
GLOVE BIO SURGEON STRL SZ8 (GLOVE) ×4 IMPLANT
GLOVE BIO SURGEONS STRL SZ 6.5 (GLOVE) ×1
GLOVE BIOGEL PI IND STRL 7.0 (GLOVE) ×4 IMPLANT
GLOVE BIOGEL PI INDICATOR 7.0 (GLOVE) ×4
GOWN STRL REUS W/ TWL LRG LVL3 (GOWN DISPOSABLE) ×2 IMPLANT
GOWN STRL REUS W/ TWL XL LVL3 (GOWN DISPOSABLE) ×2 IMPLANT
GOWN STRL REUS W/TWL LRG LVL3 (GOWN DISPOSABLE) ×4
GOWN STRL REUS W/TWL XL LVL3 (GOWN DISPOSABLE) ×4
NDL HYPO 25X1 1.5 SAFETY (NEEDLE) ×1 IMPLANT
NDL PRECISIONGLIDE 27X1.5 (NEEDLE) IMPLANT
NEEDLE HYPO 25X1 1.5 SAFETY (NEEDLE) IMPLANT
NEEDLE PRECISIONGLIDE 27X1.5 (NEEDLE) ×4 IMPLANT
NS IRRIG 1000ML POUR BTL (IV SOLUTION) ×3 IMPLANT
PACK BASIN DAY SURGERY FS (CUSTOM PROCEDURE TRAY) ×4 IMPLANT
PAD CLEANER CAUTERY TIP 5X5 (MISCELLANEOUS)
PENCIL BUTTON HOLSTER BLD 10FT (ELECTRODE) ×4 IMPLANT
SLEEVE SCD COMPRESS KNEE MED (MISCELLANEOUS) ×4 IMPLANT
STRIP CLOSURE SKIN 1/2X4 (GAUZE/BANDAGES/DRESSINGS) IMPLANT
SUT ETHILON 3 0 FSL (SUTURE) IMPLANT
SUT ETHILON 5 0 PS 2 18 (SUTURE) IMPLANT
SUT MNCRL AB 4-0 PS2 18 (SUTURE) ×6 IMPLANT
SUT VIC AB 3-0 SH 27 (SUTURE)
SUT VIC AB 3-0 SH 27X BRD (SUTURE) IMPLANT
SUT VIC AB 4-0 SH 18 (SUTURE) ×1 IMPLANT
SUT VIC AB 5-0 PS2 18 (SUTURE) IMPLANT
SUT VICRYL 3-0 CR8 SH (SUTURE) IMPLANT
SYR BULB 3OZ (MISCELLANEOUS) ×4 IMPLANT
SYR CONTROL 10ML LL (SYRINGE) ×4 IMPLANT
TOWEL GREEN STERILE FF (TOWEL DISPOSABLE) ×7 IMPLANT
TRAY DSU PREP LF (CUSTOM PROCEDURE TRAY) ×4 IMPLANT
TUBE CONNECTING 20'X1/4 (TUBING)
TUBE CONNECTING 20X1/4 (TUBING) IMPLANT
UNDERPAD 30X30 (UNDERPADS AND DIAPERS) IMPLANT
YANKAUER SUCT BULB TIP NO VENT (SUCTIONS) IMPLANT

## 2018-02-22 NOTE — Discharge Instructions (Signed)
May shower and shampoo in the morning.   This mass is deeper and much larger than on imaging back in 2017.  It may be a lymph node and removal and biopsy would entail more and deeper dissection.  I would suggest that the imaging be repeated and weigh up risks and benefits of removal.       Post Anesthesia Home Care Instructions  Activity: Get plenty of rest for the remainder of the day. A responsible individual must stay with you for 24 hours following the procedure.  For the next 24 hours, DO NOT: -Drive a car -Paediatric nurse -Drink alcoholic beverages -Take any medication unless instructed by your physician -Make any legal decisions or sign important papers.  Meals: Start with liquid foods such as gelatin or soup. Progress to regular foods as tolerated. Avoid greasy, spicy, heavy foods. If nausea and/or vomiting occur, drink only clear liquids until the nausea and/or vomiting subsides. Call your physician if vomiting continues.  Special Instructions/Symptoms: Your throat may feel dry or sore from the anesthesia or the breathing tube placed in your throat during surgery. If this causes discomfort, gargle with warm salt water. The discomfort should disappear within 24 hours.  If you had a scopolamine patch placed behind your ear for the management of post- operative nausea and/or vomiting:  1. The medication in the patch is effective for 72 hours, after which it should be removed.  Wrap patch in a tissue and discard in the trash. Wash hands thoroughly with soap and water. 2. You may remove the patch earlier than 72 hours if you experience unpleasant side effects which may include dry mouth, dizziness or visual disturbances. 3. Avoid touching the patch. Wash your hands with soap and water after contact with the patch.

## 2018-02-22 NOTE — Op Note (Signed)
Gabrielle Lynch  [birth year] 22 February 2018    PCP:  Josetta Huddle, MD   Surgeon: Kaylyn Lim, MD, FACS  Asst:  none  Anes:  General by LMA  Preop Dx: Mass behind left ear-lipoma Postop Dx: Probable enlarging lymph node  Procedure: Exploration of mass Location Surgery: CDS 5 Complications: none  EBL:   minimal cc  Drains: none  Description of Procedure:  The patient was taken to OR 5 .  After anesthesia was administered and the patient was prepped  with Technicare and a timeout was performed.  I had scrubbed the area with Technicare and premarked the area.  The mass has grown to be ~3 cm and was more deep.  The area was infiltrated with Marcaine and Neut.   I created a small incision at the edge of the hairline and dissected with small hemostat down to the mass.  This was much deeper and appeared to potentially be displacing posterior auricular nerve branch and it was anterior much deeper.  This did not appear to be a lipoma but a deep node that has increased in size and may be related to her PMR.  Incisional biopsy could complicate this case which I felt needed imaging and discussion with her rheumatologist to weigh up risks and benefits or removal.  The incision was then closed with 4-0 monocryl subcuticularly and Dermabond.    The patient tolerated the procedure well and was taken to the PACU in stable condition.     Matt B. Hassell Done, [place], [facility], [place]

## 2018-02-22 NOTE — Transfer of Care (Signed)
Immediate Anesthesia Transfer of Care Note  Patient: Gabrielle Lynch  Procedure(s) Performed: EXPLORATION OF MASS  BEHIND LEFT EAR (Left Head)  Patient Location: PACU  Anesthesia Type:General  Level of Consciousness: awake and patient cooperative  Airway & Oxygen Therapy: Patient Spontanous Breathing and Patient connected to face mask oxygen  Post-op Assessment: Report given to RN and Post -op Vital signs reviewed and stable  Post vital signs: Reviewed and stable  Last Vitals:  Vitals Value Taken Time  BP    Temp    Pulse 88 02/22/2018 11:14 AM  Resp 16 02/22/2018 11:14 AM  SpO2 100 % 02/22/2018 11:14 AM  Vitals shown include unvalidated device data.  Last Pain:  Vitals:   02/22/18 0835  TempSrc: Oral  PainSc: 0-No pain         Complications: No apparent anesthesia complications

## 2018-02-22 NOTE — Interval H&P Note (Signed)
History and Physical Interval Note:  02/22/2018 9:53 AM  Gabrielle Lynch  has presented today for surgery, with the diagnosis of lipoma of scalp  The various methods of treatment have been discussed with the patient and family. After consideration of risks, benefits and other options for treatment, the patient has consented to  Procedure(s): EXCISION OF LIPOMA BEHIND LEFT EAR (Left) as a surgical intervention .  The patient's history has been reviewed, patient examined, no change in status, stable for surgery.  I have reviewed the patient's chart and labs.  Questions were answered to the patient's satisfaction.     Pedro Earls

## 2018-02-22 NOTE — Anesthesia Preprocedure Evaluation (Addendum)
Anesthesia Evaluation  Patient identified by MRN, date of birth, ID band Patient awake    History of Anesthesia Complications (+) PONV and history of anesthetic complications  Airway Mallampati: II  TM Distance: >3 FB Neck ROM: Full    Dental no notable dental hx. 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  STATES SHE CARRIES EPI PEN--BUT HAS NOT HAD ANY RECENT EPISODES.  STATES MANY OF THE MEDICATIONS SHE TAKES ARE TO HELP THIS PROBLEM - INCLUDING  Ketotifen-mast cell inhibitor-not approved in [place].   Reproductive/Obstetrics                           Anesthesia Physical  Anesthesia Plan  ASA: III  Anesthesia Plan: General   Post-op Pain Management:    Induction: Intravenous  PONV Risk Score and Plan: 4 or greater and Ondansetron, Diphenhydramine, Treatment may vary due to age or medical condition and Dexamethasone  Airway Management Planned: Oral ETT and LMA  Additional Equipment:   Intra-op Plan:   Post-operative Plan: Extubation in OR  Informed Consent: I have reviewed the patients History and Physical, chart, labs and discussed the procedure including the risks, benefits and alternatives for the proposed anesthesia with the patient or authorized representative who has indicated his/her understanding and acceptance.   Dental advisory given  Plan Discussed with: CRNA, Anesthesiologist and  Surgeon  Anesthesia Plan Comments: ( )      Anesthesia Quick Evaluation

## 2018-02-22 NOTE — Anesthesia Procedure Notes (Signed)
Procedure Name: LMA Insertion Date/Time: 02/22/2018 10:16 AM Performed by: Marrianne Mood, CRNA Pre-anesthesia Checklist: Patient identified, Emergency Drugs available, Suction available, Patient being monitored and Timeout performed Patient Re-evaluated:Patient Re-evaluated prior to induction Oxygen Delivery Method: Circle system utilized Preoxygenation: Pre-oxygenation with 100% oxygen Induction Type: IV induction Ventilation: Mask ventilation without difficulty LMA: LMA inserted LMA Size: 4.0 Number of attempts: 1 Airway Equipment and Method: Bite block Placement Confirmation: positive ETCO2 Tube secured with: Tape Dental Injury: Teeth and Oropharynx as per pre-operative assessment

## 2018-02-25 ENCOUNTER — Ambulatory Visit: Payer: Medicare Other | Admitting: Obstetrics and Gynecology

## 2018-02-25 ENCOUNTER — Encounter (HOSPITAL_BASED_OUTPATIENT_CLINIC_OR_DEPARTMENT_OTHER): Payer: Self-pay | Admitting: Surgery

## 2018-02-25 NOTE — Anesthesia Postprocedure Evaluation (Signed)
Anesthesia Post Note  Patient: Gabrielle Lynch  Procedure(s) Performed: EXPLORATION OF MASS  BEHIND LEFT EAR (Left Head)     Patient location during evaluation: PACU Anesthesia Type: MAC Level of consciousness: awake and alert Pain management: pain level controlled Vital Signs Assessment: post-procedure vital signs reviewed and stable Respiratory status: spontaneous breathing, nonlabored ventilation, respiratory function stable and patient connected to nasal cannula oxygen Cardiovascular status: stable and blood pressure returned to baseline Postop Assessment: no apparent nausea or vomiting Anesthetic complications: no    Last Vitals:  Vitals:   02/22/18 1200 02/22/18 1230  BP: 129/61 131/77  Pulse: 78 77  Resp: 16 16  Temp:  (!) 36.2 C  SpO2: 96% 96%    Last Pain:  Vitals:   02/22/18 1230  TempSrc:   PainSc: 0-No pain                 Donye Dauenhauer

## 2018-02-25 NOTE — Anesthesia Postprocedure Evaluation (Signed)
Anesthesia Post Note  Patient: Gabrielle Lynch  Procedure(s) Performed: EXPLORATION OF MASS  BEHIND LEFT EAR (Left Head)     Patient location during evaluation: PACU Anesthesia Type: General Level of consciousness: awake and alert Pain management: pain level controlled Vital Signs Assessment: post-procedure vital signs reviewed and stable Respiratory status: spontaneous breathing, nonlabored ventilation, respiratory function stable and patient connected to nasal cannula oxygen Cardiovascular status: blood pressure returned to baseline and stable Postop Assessment: no apparent nausea or vomiting Anesthetic complications: no    Last Vitals:  Vitals:   02/22/18 1200 02/22/18 1230  BP: 129/61 131/77  Pulse: 78 77  Resp: 16 16  Temp:  (!) 36.2 C  SpO2: 96% 96%    Last Pain:  Vitals:   02/22/18 1230  TempSrc:   PainSc: 0-No pain                 Favian Kittleson

## 2018-02-25 NOTE — Addendum Note (Signed)
Addendum  created 02/25/18 1229 by Janeece Riggers, MD   Sign clinical note

## 2018-03-04 DIAGNOSIS — R220 Localized swelling, mass and lump, head: Secondary | ICD-10-CM | POA: Diagnosis not present

## 2018-03-04 DIAGNOSIS — D179 Benign lipomatous neoplasm, unspecified: Secondary | ICD-10-CM | POA: Diagnosis not present

## 2018-03-04 DIAGNOSIS — Q391 Atresia of esophagus with tracheo-esophageal fistula: Secondary | ICD-10-CM | POA: Diagnosis not present

## 2018-03-05 ENCOUNTER — Other Ambulatory Visit: Payer: Self-pay | Admitting: Obstetrics and Gynecology

## 2018-03-05 NOTE — Telephone Encounter (Signed)
Medication refill request: gabapentin 100mg   Last AEX:  10/22/17 Next AEX:  11/13/18 Last MMG (if hormonal medication request):  02/12/17 Birads 1 neg. Patient has a MMG scheduled for 03/06/18  Refill authorized: #[id number] with 0 RF please advise

## 2018-03-06 ENCOUNTER — Ambulatory Visit
Admission: RE | Admit: 2018-03-06 | Discharge: 2018-03-06 | Disposition: A | Discharge status: Home / Self Care | Payer: Medicare Other | Source: Ambulatory Visit | Attending: Internal Medicine | Admitting: Internal Medicine

## 2018-03-06 DIAGNOSIS — Z1231 Encounter for screening mammogram for malignant neoplasm of breast: Secondary | ICD-10-CM | POA: Diagnosis not present

## 2018-03-12 ENCOUNTER — Other Ambulatory Visit: Payer: Medicare Other

## 2018-03-12 ENCOUNTER — Telehealth: Payer: Self-pay | Admitting: Obstetrics and Gynecology

## 2018-03-12 NOTE — Telephone Encounter (Signed)
Thank you for the update!

## 2018-03-12 NOTE — Telephone Encounter (Signed)
Patient cancelled her 2 month recheck and will call back to reschedule.

## 2018-03-13 ENCOUNTER — Other Ambulatory Visit: Payer: Medicare Other

## 2018-03-13 DIAGNOSIS — Z23 Encounter for immunization: Secondary | ICD-10-CM | POA: Diagnosis not present

## 2018-03-18 DIAGNOSIS — M353 Polymyalgia rheumatica: Secondary | ICD-10-CM | POA: Diagnosis not present

## 2018-03-18 DIAGNOSIS — H43812 Vitreous degeneration, left eye: Secondary | ICD-10-CM | POA: Diagnosis not present

## 2018-03-18 DIAGNOSIS — I10 Essential (primary) hypertension: Secondary | ICD-10-CM | POA: Diagnosis not present

## 2018-03-18 DIAGNOSIS — E1129 Type 2 diabetes mellitus with other diabetic kidney complication: Secondary | ICD-10-CM | POA: Diagnosis not present

## 2018-03-18 DIAGNOSIS — Z6831 Body mass index (BMI) 31.0-31.9, adult: Secondary | ICD-10-CM | POA: Diagnosis not present

## 2018-03-18 DIAGNOSIS — D808 Other immunodeficiencies with predominantly antibody defects: Secondary | ICD-10-CM | POA: Diagnosis not present

## 2018-03-18 DIAGNOSIS — E038 Other specified hypothyroidism: Secondary | ICD-10-CM | POA: Diagnosis not present

## 2018-03-18 DIAGNOSIS — Z9884 Bariatric surgery status: Secondary | ICD-10-CM | POA: Diagnosis not present

## 2018-03-18 DIAGNOSIS — N183 Chronic kidney disease, stage 3 (moderate): Secondary | ICD-10-CM | POA: Diagnosis not present

## 2018-03-18 DIAGNOSIS — E7849 Other hyperlipidemia: Secondary | ICD-10-CM | POA: Diagnosis not present

## 2018-03-18 DIAGNOSIS — Z794 Long term (current) use of insulin: Secondary | ICD-10-CM | POA: Diagnosis not present

## 2018-03-18 DIAGNOSIS — M859 Disorder of bone density and structure, unspecified: Secondary | ICD-10-CM | POA: Diagnosis not present

## 2018-03-20 ENCOUNTER — Ambulatory Visit: Payer: Medicare Other | Admitting: Obstetrics and Gynecology

## 2018-03-21 DIAGNOSIS — M7062 Trochanteric bursitis, left hip: Secondary | ICD-10-CM | POA: Diagnosis not present

## 2018-03-21 DIAGNOSIS — M461 Sacroiliitis, not elsewhere classified: Secondary | ICD-10-CM | POA: Diagnosis not present

## 2018-04-01 ENCOUNTER — Ambulatory Visit
Admission: RE | Admit: 2018-04-01 | Discharge: 2018-04-01 | Disposition: A | Discharge status: Home / Self Care | Payer: Medicare Other | Source: Ambulatory Visit | Attending: Internal Medicine | Admitting: Internal Medicine

## 2018-04-01 ENCOUNTER — Other Ambulatory Visit: Payer: Self-pay | Admitting: Internal Medicine

## 2018-04-01 DIAGNOSIS — R0789 Other chest pain: Secondary | ICD-10-CM

## 2018-04-01 DIAGNOSIS — W19XXXA Unspecified fall, initial encounter: Secondary | ICD-10-CM | POA: Diagnosis not present

## 2018-04-01 DIAGNOSIS — S299XXA Unspecified injury of thorax, initial encounter: Secondary | ICD-10-CM | POA: Diagnosis not present

## 2018-04-02 DIAGNOSIS — D170 Benign lipomatous neoplasm of skin and subcutaneous tissue of head, face and neck: Secondary | ICD-10-CM | POA: Diagnosis not present

## 2018-04-02 DIAGNOSIS — D179 Benign lipomatous neoplasm, unspecified: Secondary | ICD-10-CM | POA: Diagnosis not present

## 2018-04-02 DIAGNOSIS — E119 Type 2 diabetes mellitus without complications: Secondary | ICD-10-CM | POA: Diagnosis not present

## 2018-04-05 ENCOUNTER — Other Ambulatory Visit: Payer: Self-pay | Admitting: *Deleted

## 2018-04-05 MED ORDER — GABAPENTIN 100 MG PO CAPS: ORAL_CAPSULE | ORAL | 5 refills | Status: DC

## 2018-04-05 NOTE — Telephone Encounter (Signed)
Medication refill request: gabapentin Last AEX:  10-22-17 BS Next AEX: 11-13-18  Last MMG (if hormonal medication request): 03-06-18 density C/BIRADS 1 negative  Refill authorized: 03-05-18 #120, 0RF. Today, please advise.

## 2018-04-08 DIAGNOSIS — D649 Anemia, unspecified: Secondary | ICD-10-CM | POA: Diagnosis not present

## 2018-04-08 DIAGNOSIS — E538 Deficiency of other specified B group vitamins: Secondary | ICD-10-CM | POA: Diagnosis not present

## 2018-04-08 DIAGNOSIS — R768 Other specified abnormal immunological findings in serum: Secondary | ICD-10-CM | POA: Diagnosis not present

## 2018-04-08 DIAGNOSIS — N189 Chronic kidney disease, unspecified: Secondary | ICD-10-CM | POA: Diagnosis not present

## 2018-04-09 DIAGNOSIS — Z23 Encounter for immunization: Secondary | ICD-10-CM | POA: Diagnosis not present

## 2018-04-09 DIAGNOSIS — R0789 Other chest pain: Secondary | ICD-10-CM | POA: Diagnosis not present

## 2018-04-09 DIAGNOSIS — N183 Chronic kidney disease, stage 3 (moderate): Secondary | ICD-10-CM | POA: Diagnosis not present

## 2018-04-09 DIAGNOSIS — E7800 Pure hypercholesterolemia, unspecified: Secondary | ICD-10-CM | POA: Diagnosis not present

## 2018-04-09 DIAGNOSIS — E119 Type 2 diabetes mellitus without complications: Secondary | ICD-10-CM | POA: Diagnosis not present

## 2018-04-09 DIAGNOSIS — E559 Vitamin D deficiency, unspecified: Secondary | ICD-10-CM | POA: Diagnosis not present

## 2018-04-09 DIAGNOSIS — Z1389 Encounter for screening for other disorder: Secondary | ICD-10-CM | POA: Diagnosis not present

## 2018-04-09 DIAGNOSIS — E538 Deficiency of other specified B group vitamins: Secondary | ICD-10-CM | POA: Diagnosis not present

## 2018-04-09 DIAGNOSIS — I77810 Thoracic aortic ectasia: Secondary | ICD-10-CM | POA: Diagnosis not present

## 2018-04-09 DIAGNOSIS — M353 Polymyalgia rheumatica: Secondary | ICD-10-CM | POA: Diagnosis not present

## 2018-04-09 DIAGNOSIS — Z0000 Encounter for general adult medical examination without abnormal findings: Secondary | ICD-10-CM | POA: Diagnosis not present

## 2018-04-09 DIAGNOSIS — E039 Hypothyroidism, unspecified: Secondary | ICD-10-CM | POA: Diagnosis not present

## 2018-04-09 DIAGNOSIS — F5109 Other insomnia not due to a substance or known physiological condition: Secondary | ICD-10-CM | POA: Diagnosis not present

## 2018-04-09 DIAGNOSIS — G43109 Migraine with aura, not intractable, without status migrainosus: Secondary | ICD-10-CM | POA: Diagnosis not present

## 2018-04-09 DIAGNOSIS — Q822 Mastocytosis: Secondary | ICD-10-CM | POA: Diagnosis not present

## 2018-04-16 ENCOUNTER — Ambulatory Visit (INDEPENDENT_AMBULATORY_CARE_PROVIDER_SITE_OTHER): Payer: Medicare Other | Admitting: Psychology

## 2018-04-16 DIAGNOSIS — F3489 Other specified persistent mood disorders: Secondary | ICD-10-CM

## 2018-04-16 DIAGNOSIS — M7061 Trochanteric bursitis, right hip: Secondary | ICD-10-CM | POA: Diagnosis not present

## 2018-04-22 DIAGNOSIS — M7061 Trochanteric bursitis, right hip: Secondary | ICD-10-CM | POA: Diagnosis not present

## 2018-04-23 DIAGNOSIS — M353 Polymyalgia rheumatica: Secondary | ICD-10-CM | POA: Diagnosis not present

## 2018-04-23 DIAGNOSIS — M8580 Other specified disorders of bone density and structure, unspecified site: Secondary | ICD-10-CM | POA: Diagnosis not present

## 2018-04-23 DIAGNOSIS — Z7952 Long term (current) use of systemic steroids: Secondary | ICD-10-CM | POA: Diagnosis not present

## 2018-04-23 DIAGNOSIS — M316 Other giant cell arteritis: Secondary | ICD-10-CM | POA: Diagnosis not present

## 2018-04-23 DIAGNOSIS — M7071 Other bursitis of hip, right hip: Secondary | ICD-10-CM | POA: Diagnosis not present

## 2018-04-23 DIAGNOSIS — M2550 Pain in unspecified joint: Secondary | ICD-10-CM | POA: Diagnosis not present

## 2018-05-06 ENCOUNTER — Ambulatory Visit: Payer: BLUE CROSS/BLUE SHIELD | Admitting: Psychology

## 2018-05-07 DIAGNOSIS — M353 Polymyalgia rheumatica: Secondary | ICD-10-CM | POA: Diagnosis not present

## 2018-05-07 DIAGNOSIS — N183 Chronic kidney disease, stage 3 (moderate): Secondary | ICD-10-CM | POA: Diagnosis not present

## 2018-05-07 DIAGNOSIS — E038 Other specified hypothyroidism: Secondary | ICD-10-CM | POA: Diagnosis not present

## 2018-05-07 DIAGNOSIS — I10 Essential (primary) hypertension: Secondary | ICD-10-CM | POA: Diagnosis not present

## 2018-05-07 DIAGNOSIS — E039 Hypothyroidism, unspecified: Secondary | ICD-10-CM | POA: Diagnosis not present

## 2018-05-07 DIAGNOSIS — Z794 Long term (current) use of insulin: Secondary | ICD-10-CM | POA: Diagnosis not present

## 2018-05-07 DIAGNOSIS — E1129 Type 2 diabetes mellitus with other diabetic kidney complication: Secondary | ICD-10-CM | POA: Diagnosis not present

## 2018-05-07 DIAGNOSIS — Z4681 Encounter for fitting and adjustment of insulin pump: Secondary | ICD-10-CM | POA: Diagnosis not present

## 2018-05-08 ENCOUNTER — Telehealth: Payer: Self-pay | Admitting: Obstetrics and Gynecology

## 2018-05-08 DIAGNOSIS — E559 Vitamin D deficiency, unspecified: Secondary | ICD-10-CM | POA: Diagnosis not present

## 2018-05-08 DIAGNOSIS — M1611 Unilateral primary osteoarthritis, right hip: Secondary | ICD-10-CM | POA: Diagnosis not present

## 2018-05-08 DIAGNOSIS — M7061 Trochanteric bursitis, right hip: Secondary | ICD-10-CM | POA: Diagnosis not present

## 2018-05-08 DIAGNOSIS — Z48817 Encounter for surgical aftercare following surgery on the skin and subcutaneous tissue: Secondary | ICD-10-CM | POA: Diagnosis not present

## 2018-05-08 DIAGNOSIS — M810 Age-related osteoporosis without current pathological fracture: Secondary | ICD-10-CM | POA: Diagnosis not present

## 2018-05-08 NOTE — Telephone Encounter (Signed)
Please reach out to patient to reschedule her lab visit.  She came up in my reminder box in Epic.  She needs to have her testosterone level rechecked if she is still using this medication.  If she is not, then she does not need to lab visit.

## 2018-05-08 NOTE — Telephone Encounter (Signed)
Message left to return call to Triage Nurse at [phone].    

## 2018-05-09 DIAGNOSIS — M7061 Trochanteric bursitis, right hip: Secondary | ICD-10-CM | POA: Diagnosis not present

## 2018-05-13 DIAGNOSIS — K148 Other diseases of tongue: Secondary | ICD-10-CM | POA: Diagnosis not present

## 2018-05-14 NOTE — Telephone Encounter (Signed)
Spoke with patient. Patient is no longer using testosterone cream. Advised she does not need labs as she has discontinued the cream. Patient has an aex scheduled for 11/13/2018 with Dr.Silva. Encounter closed.

## 2018-06-03 ENCOUNTER — Ambulatory Visit (INDEPENDENT_AMBULATORY_CARE_PROVIDER_SITE_OTHER): Payer: Medicare Other | Admitting: Psychology

## 2018-06-03 DIAGNOSIS — F3489 Other specified persistent mood disorders: Secondary | ICD-10-CM

## 2018-06-05 DIAGNOSIS — E1129 Type 2 diabetes mellitus with other diabetic kidney complication: Secondary | ICD-10-CM | POA: Diagnosis not present

## 2018-06-05 DIAGNOSIS — I10 Essential (primary) hypertension: Secondary | ICD-10-CM | POA: Diagnosis not present

## 2018-06-05 DIAGNOSIS — M25552 Pain in left hip: Secondary | ICD-10-CM | POA: Diagnosis not present

## 2018-06-05 DIAGNOSIS — Z794 Long term (current) use of insulin: Secondary | ICD-10-CM | POA: Diagnosis not present

## 2018-06-05 DIAGNOSIS — M6281 Muscle weakness (generalized): Secondary | ICD-10-CM | POA: Diagnosis not present

## 2018-06-05 DIAGNOSIS — M353 Polymyalgia rheumatica: Secondary | ICD-10-CM | POA: Diagnosis not present

## 2018-06-05 DIAGNOSIS — Z4681 Encounter for fitting and adjustment of insulin pump: Secondary | ICD-10-CM | POA: Diagnosis not present

## 2018-06-05 DIAGNOSIS — M7061 Trochanteric bursitis, right hip: Secondary | ICD-10-CM | POA: Diagnosis not present

## 2018-06-05 DIAGNOSIS — M25551 Pain in right hip: Secondary | ICD-10-CM | POA: Diagnosis not present

## 2018-06-05 DIAGNOSIS — N183 Chronic kidney disease, stage 3 (moderate): Secondary | ICD-10-CM | POA: Diagnosis not present

## 2018-06-11 DIAGNOSIS — M25551 Pain in right hip: Secondary | ICD-10-CM | POA: Diagnosis not present

## 2018-06-11 DIAGNOSIS — M25552 Pain in left hip: Secondary | ICD-10-CM | POA: Diagnosis not present

## 2018-06-11 DIAGNOSIS — M353 Polymyalgia rheumatica: Secondary | ICD-10-CM | POA: Diagnosis not present

## 2018-06-11 DIAGNOSIS — M7061 Trochanteric bursitis, right hip: Secondary | ICD-10-CM | POA: Diagnosis not present

## 2018-06-11 DIAGNOSIS — M6281 Muscle weakness (generalized): Secondary | ICD-10-CM | POA: Diagnosis not present

## 2018-06-13 DIAGNOSIS — M25552 Pain in left hip: Secondary | ICD-10-CM | POA: Diagnosis not present

## 2018-06-13 DIAGNOSIS — M7061 Trochanteric bursitis, right hip: Secondary | ICD-10-CM | POA: Diagnosis not present

## 2018-06-13 DIAGNOSIS — M25551 Pain in right hip: Secondary | ICD-10-CM | POA: Diagnosis not present

## 2018-06-13 DIAGNOSIS — M6281 Muscle weakness (generalized): Secondary | ICD-10-CM | POA: Diagnosis not present

## 2018-06-24 ENCOUNTER — Other Ambulatory Visit: Payer: Self-pay | Admitting: Orthopedic Surgery

## 2018-06-24 DIAGNOSIS — M8430XA Stress fracture, unspecified site, initial encounter for fracture: Secondary | ICD-10-CM

## 2018-06-24 DIAGNOSIS — M545 Low back pain, unspecified: Secondary | ICD-10-CM

## 2018-06-24 DIAGNOSIS — M25551 Pain in right hip: Secondary | ICD-10-CM | POA: Diagnosis not present

## 2018-06-26 DIAGNOSIS — M6281 Muscle weakness (generalized): Secondary | ICD-10-CM | POA: Diagnosis not present

## 2018-06-26 DIAGNOSIS — M25552 Pain in left hip: Secondary | ICD-10-CM | POA: Diagnosis not present

## 2018-06-26 DIAGNOSIS — M25551 Pain in right hip: Secondary | ICD-10-CM | POA: Diagnosis not present

## 2018-06-26 DIAGNOSIS — M7061 Trochanteric bursitis, right hip: Secondary | ICD-10-CM | POA: Diagnosis not present

## 2018-07-01 DIAGNOSIS — M25551 Pain in right hip: Secondary | ICD-10-CM | POA: Diagnosis not present

## 2018-07-01 DIAGNOSIS — M25552 Pain in left hip: Secondary | ICD-10-CM | POA: Diagnosis not present

## 2018-07-01 DIAGNOSIS — M7061 Trochanteric bursitis, right hip: Secondary | ICD-10-CM | POA: Diagnosis not present

## 2018-07-01 DIAGNOSIS — M6281 Muscle weakness (generalized): Secondary | ICD-10-CM | POA: Diagnosis not present

## 2018-07-02 ENCOUNTER — Ambulatory Visit
Admission: RE | Admit: 2018-07-02 | Discharge: 2018-07-02 | Disposition: A | Discharge status: Home / Self Care | Payer: Medicare Other | Source: Ambulatory Visit | Attending: Orthopedic Surgery | Admitting: Orthopedic Surgery

## 2018-07-02 DIAGNOSIS — M4726 Other spondylosis with radiculopathy, lumbar region: Secondary | ICD-10-CM | POA: Diagnosis not present

## 2018-07-02 DIAGNOSIS — M48061 Spinal stenosis, lumbar region without neurogenic claudication: Secondary | ICD-10-CM | POA: Diagnosis not present

## 2018-07-02 DIAGNOSIS — S73191A Other sprain of right hip, initial encounter: Secondary | ICD-10-CM | POA: Diagnosis not present

## 2018-07-02 DIAGNOSIS — M5116 Intervertebral disc disorders with radiculopathy, lumbar region: Secondary | ICD-10-CM | POA: Diagnosis not present

## 2018-07-02 DIAGNOSIS — M545 Low back pain, unspecified: Secondary | ICD-10-CM

## 2018-07-02 DIAGNOSIS — M8430XA Stress fracture, unspecified site, initial encounter for fracture: Secondary | ICD-10-CM

## 2018-07-02 DIAGNOSIS — J209 Acute bronchitis, unspecified: Secondary | ICD-10-CM | POA: Diagnosis not present

## 2018-07-10 DIAGNOSIS — M5416 Radiculopathy, lumbar region: Secondary | ICD-10-CM | POA: Diagnosis not present

## 2018-07-17 DIAGNOSIS — Z6828 Body mass index (BMI) 28.0-28.9, adult: Secondary | ICD-10-CM | POA: Diagnosis not present

## 2018-07-17 DIAGNOSIS — M2550 Pain in unspecified joint: Secondary | ICD-10-CM | POA: Diagnosis not present

## 2018-07-17 DIAGNOSIS — E663 Overweight: Secondary | ICD-10-CM | POA: Diagnosis not present

## 2018-07-17 DIAGNOSIS — Z7952 Long term (current) use of systemic steroids: Secondary | ICD-10-CM | POA: Diagnosis not present

## 2018-07-17 DIAGNOSIS — M316 Other giant cell arteritis: Secondary | ICD-10-CM | POA: Diagnosis not present

## 2018-07-17 DIAGNOSIS — M7062 Trochanteric bursitis, left hip: Secondary | ICD-10-CM | POA: Diagnosis not present

## 2018-07-17 DIAGNOSIS — M7061 Trochanteric bursitis, right hip: Secondary | ICD-10-CM | POA: Diagnosis not present

## 2018-07-17 DIAGNOSIS — M353 Polymyalgia rheumatica: Secondary | ICD-10-CM | POA: Diagnosis not present

## 2018-07-18 DIAGNOSIS — M1611 Unilateral primary osteoarthritis, right hip: Secondary | ICD-10-CM | POA: Diagnosis not present

## 2018-07-25 DIAGNOSIS — M5416 Radiculopathy, lumbar region: Secondary | ICD-10-CM | POA: Diagnosis not present

## 2018-08-15 DIAGNOSIS — M5416 Radiculopathy, lumbar region: Secondary | ICD-10-CM | POA: Diagnosis not present

## 2018-08-15 DIAGNOSIS — M545 Low back pain: Secondary | ICD-10-CM | POA: Diagnosis not present

## 2018-08-22 DIAGNOSIS — M5416 Radiculopathy, lumbar region: Secondary | ICD-10-CM | POA: Diagnosis not present

## 2018-08-22 DIAGNOSIS — M545 Low back pain: Secondary | ICD-10-CM | POA: Diagnosis not present

## 2018-09-02 DIAGNOSIS — Z9884 Bariatric surgery status: Secondary | ICD-10-CM | POA: Diagnosis not present

## 2018-09-02 DIAGNOSIS — N183 Chronic kidney disease, stage 3 (moderate): Secondary | ICD-10-CM | POA: Diagnosis not present

## 2018-09-02 DIAGNOSIS — M353 Polymyalgia rheumatica: Secondary | ICD-10-CM | POA: Diagnosis not present

## 2018-09-02 DIAGNOSIS — E039 Hypothyroidism, unspecified: Secondary | ICD-10-CM | POA: Diagnosis not present

## 2018-09-02 DIAGNOSIS — H43812 Vitreous degeneration, left eye: Secondary | ICD-10-CM | POA: Diagnosis not present

## 2018-09-02 DIAGNOSIS — D808 Other immunodeficiencies with predominantly antibody defects: Secondary | ICD-10-CM | POA: Diagnosis not present

## 2018-09-02 DIAGNOSIS — D649 Anemia, unspecified: Secondary | ICD-10-CM | POA: Diagnosis not present

## 2018-09-02 DIAGNOSIS — I712 Thoracic aortic aneurysm, without rupture: Secondary | ICD-10-CM | POA: Diagnosis not present

## 2018-09-02 DIAGNOSIS — E1129 Type 2 diabetes mellitus with other diabetic kidney complication: Secondary | ICD-10-CM | POA: Diagnosis not present

## 2018-09-02 DIAGNOSIS — M8580 Other specified disorders of bone density and structure, unspecified site: Secondary | ICD-10-CM | POA: Diagnosis not present

## 2018-09-02 DIAGNOSIS — Z1331 Encounter for screening for depression: Secondary | ICD-10-CM | POA: Diagnosis not present

## 2018-09-02 DIAGNOSIS — I129 Hypertensive chronic kidney disease with stage 1 through stage 4 chronic kidney disease, or unspecified chronic kidney disease: Secondary | ICD-10-CM | POA: Diagnosis not present

## 2018-09-11 DIAGNOSIS — M353 Polymyalgia rheumatica: Secondary | ICD-10-CM | POA: Diagnosis not present

## 2018-09-11 DIAGNOSIS — I129 Hypertensive chronic kidney disease with stage 1 through stage 4 chronic kidney disease, or unspecified chronic kidney disease: Secondary | ICD-10-CM | POA: Diagnosis not present

## 2018-09-11 DIAGNOSIS — E1129 Type 2 diabetes mellitus with other diabetic kidney complication: Secondary | ICD-10-CM | POA: Diagnosis not present

## 2018-09-16 DIAGNOSIS — M545 Low back pain: Secondary | ICD-10-CM | POA: Diagnosis not present

## 2018-09-20 DIAGNOSIS — D649 Anemia, unspecified: Secondary | ICD-10-CM | POA: Diagnosis not present

## 2018-09-20 DIAGNOSIS — I129 Hypertensive chronic kidney disease with stage 1 through stage 4 chronic kidney disease, or unspecified chronic kidney disease: Secondary | ICD-10-CM | POA: Diagnosis not present

## 2018-09-20 DIAGNOSIS — M859 Disorder of bone density and structure, unspecified: Secondary | ICD-10-CM | POA: Diagnosis not present

## 2018-09-20 DIAGNOSIS — D6489 Other specified anemias: Secondary | ICD-10-CM | POA: Diagnosis not present

## 2018-09-20 DIAGNOSIS — E1129 Type 2 diabetes mellitus with other diabetic kidney complication: Secondary | ICD-10-CM | POA: Diagnosis not present

## 2018-09-20 DIAGNOSIS — E7849 Other hyperlipidemia: Secondary | ICD-10-CM | POA: Diagnosis not present

## 2018-09-24 DIAGNOSIS — M2550 Pain in unspecified joint: Secondary | ICD-10-CM | POA: Diagnosis not present

## 2018-09-24 DIAGNOSIS — M353 Polymyalgia rheumatica: Secondary | ICD-10-CM | POA: Diagnosis not present

## 2018-09-24 DIAGNOSIS — R5383 Other fatigue: Secondary | ICD-10-CM | POA: Diagnosis not present

## 2018-09-24 DIAGNOSIS — R202 Paresthesia of skin: Secondary | ICD-10-CM | POA: Diagnosis not present

## 2018-09-24 DIAGNOSIS — M7062 Trochanteric bursitis, left hip: Secondary | ICD-10-CM | POA: Diagnosis not present

## 2018-09-24 DIAGNOSIS — M7061 Trochanteric bursitis, right hip: Secondary | ICD-10-CM | POA: Diagnosis not present

## 2018-09-24 DIAGNOSIS — Z7952 Long term (current) use of systemic steroids: Secondary | ICD-10-CM | POA: Diagnosis not present

## 2018-09-24 DIAGNOSIS — M316 Other giant cell arteritis: Secondary | ICD-10-CM | POA: Diagnosis not present

## 2018-09-24 DIAGNOSIS — Z6828 Body mass index (BMI) 28.0-28.9, adult: Secondary | ICD-10-CM | POA: Diagnosis not present

## 2018-10-09 DIAGNOSIS — M353 Polymyalgia rheumatica: Secondary | ICD-10-CM | POA: Diagnosis not present

## 2018-10-09 DIAGNOSIS — E538 Deficiency of other specified B group vitamins: Secondary | ICD-10-CM | POA: Diagnosis not present

## 2018-10-09 DIAGNOSIS — I129 Hypertensive chronic kidney disease with stage 1 through stage 4 chronic kidney disease, or unspecified chronic kidney disease: Secondary | ICD-10-CM | POA: Diagnosis not present

## 2018-10-09 DIAGNOSIS — N183 Chronic kidney disease, stage 3 (moderate): Secondary | ICD-10-CM | POA: Diagnosis not present

## 2018-10-09 DIAGNOSIS — R768 Other specified abnormal immunological findings in serum: Secondary | ICD-10-CM | POA: Diagnosis not present

## 2018-10-09 DIAGNOSIS — Z794 Long term (current) use of insulin: Secondary | ICD-10-CM | POA: Diagnosis not present

## 2018-10-09 DIAGNOSIS — Z4681 Encounter for fitting and adjustment of insulin pump: Secondary | ICD-10-CM | POA: Diagnosis not present

## 2018-10-09 DIAGNOSIS — E1129 Type 2 diabetes mellitus with other diabetic kidney complication: Secondary | ICD-10-CM | POA: Diagnosis not present

## 2018-10-14 DIAGNOSIS — G4719 Other hypersomnia: Secondary | ICD-10-CM | POA: Diagnosis not present

## 2018-10-16 DIAGNOSIS — G4733 Obstructive sleep apnea (adult) (pediatric): Secondary | ICD-10-CM | POA: Diagnosis not present

## 2018-10-17 DIAGNOSIS — G4733 Obstructive sleep apnea (adult) (pediatric): Secondary | ICD-10-CM | POA: Diagnosis not present

## 2018-11-07 DIAGNOSIS — M5416 Radiculopathy, lumbar region: Secondary | ICD-10-CM | POA: Diagnosis not present

## 2018-11-07 DIAGNOSIS — M545 Low back pain: Secondary | ICD-10-CM | POA: Diagnosis not present

## 2018-11-13 ENCOUNTER — Ambulatory Visit: Payer: Medicare Other | Admitting: Obstetrics and Gynecology

## 2018-11-14 DIAGNOSIS — M545 Low back pain: Secondary | ICD-10-CM | POA: Diagnosis not present

## 2018-11-14 DIAGNOSIS — M6281 Muscle weakness (generalized): Secondary | ICD-10-CM | POA: Diagnosis not present

## 2018-11-14 DIAGNOSIS — M47896 Other spondylosis, lumbar region: Secondary | ICD-10-CM | POA: Diagnosis not present

## 2018-11-25 DIAGNOSIS — M316 Other giant cell arteritis: Secondary | ICD-10-CM | POA: Diagnosis not present

## 2018-11-25 DIAGNOSIS — M2550 Pain in unspecified joint: Secondary | ICD-10-CM | POA: Diagnosis not present

## 2018-11-25 DIAGNOSIS — E7800 Pure hypercholesterolemia, unspecified: Secondary | ICD-10-CM | POA: Diagnosis not present

## 2018-11-25 DIAGNOSIS — R5383 Other fatigue: Secondary | ICD-10-CM | POA: Diagnosis not present

## 2018-11-25 DIAGNOSIS — M353 Polymyalgia rheumatica: Secondary | ICD-10-CM | POA: Diagnosis not present

## 2018-12-10 DIAGNOSIS — I129 Hypertensive chronic kidney disease with stage 1 through stage 4 chronic kidney disease, or unspecified chronic kidney disease: Secondary | ICD-10-CM | POA: Diagnosis not present

## 2018-12-10 DIAGNOSIS — E1129 Type 2 diabetes mellitus with other diabetic kidney complication: Secondary | ICD-10-CM | POA: Diagnosis not present

## 2018-12-10 DIAGNOSIS — N183 Chronic kidney disease, stage 3 (moderate): Secondary | ICD-10-CM | POA: Diagnosis not present

## 2018-12-10 DIAGNOSIS — E7849 Other hyperlipidemia: Secondary | ICD-10-CM | POA: Diagnosis not present

## 2018-12-10 DIAGNOSIS — K582 Mixed irritable bowel syndrome: Secondary | ICD-10-CM | POA: Diagnosis not present

## 2018-12-12 DIAGNOSIS — R29898 Other symptoms and signs involving the musculoskeletal system: Secondary | ICD-10-CM | POA: Diagnosis not present

## 2018-12-23 DIAGNOSIS — E039 Hypothyroidism, unspecified: Secondary | ICD-10-CM | POA: Diagnosis not present

## 2018-12-23 DIAGNOSIS — E119 Type 2 diabetes mellitus without complications: Secondary | ICD-10-CM | POA: Diagnosis not present

## 2018-12-23 DIAGNOSIS — E7800 Pure hypercholesterolemia, unspecified: Secondary | ICD-10-CM | POA: Diagnosis not present

## 2018-12-23 DIAGNOSIS — D649 Anemia, unspecified: Secondary | ICD-10-CM | POA: Diagnosis not present

## 2018-12-23 DIAGNOSIS — I10 Essential (primary) hypertension: Secondary | ICD-10-CM | POA: Diagnosis not present

## 2018-12-23 DIAGNOSIS — N183 Chronic kidney disease, stage 3 (moderate): Secondary | ICD-10-CM | POA: Diagnosis not present

## 2018-12-24 DIAGNOSIS — I129 Hypertensive chronic kidney disease with stage 1 through stage 4 chronic kidney disease, or unspecified chronic kidney disease: Secondary | ICD-10-CM | POA: Diagnosis not present

## 2018-12-24 DIAGNOSIS — E538 Deficiency of other specified B group vitamins: Secondary | ICD-10-CM | POA: Diagnosis not present

## 2018-12-24 DIAGNOSIS — M353 Polymyalgia rheumatica: Secondary | ICD-10-CM | POA: Diagnosis not present

## 2018-12-24 DIAGNOSIS — Z7984 Long term (current) use of oral hypoglycemic drugs: Secondary | ICD-10-CM | POA: Diagnosis not present

## 2018-12-24 DIAGNOSIS — E1122 Type 2 diabetes mellitus with diabetic chronic kidney disease: Secondary | ICD-10-CM | POA: Diagnosis not present

## 2018-12-24 DIAGNOSIS — D631 Anemia in chronic kidney disease: Secondary | ICD-10-CM | POA: Diagnosis not present

## 2018-12-24 DIAGNOSIS — D519 Vitamin B12 deficiency anemia, unspecified: Secondary | ICD-10-CM | POA: Diagnosis not present

## 2018-12-24 DIAGNOSIS — R768 Other specified abnormal immunological findings in serum: Secondary | ICD-10-CM | POA: Diagnosis not present

## 2018-12-24 DIAGNOSIS — N183 Chronic kidney disease, stage 3 (moderate): Secondary | ICD-10-CM | POA: Diagnosis not present

## 2018-12-24 DIAGNOSIS — Z79899 Other long term (current) drug therapy: Secondary | ICD-10-CM | POA: Diagnosis not present

## 2018-12-25 DIAGNOSIS — D2272 Melanocytic nevi of left lower limb, including hip: Secondary | ICD-10-CM | POA: Diagnosis not present

## 2018-12-25 DIAGNOSIS — D225 Melanocytic nevi of trunk: Secondary | ICD-10-CM | POA: Diagnosis not present

## 2018-12-25 DIAGNOSIS — D2372 Other benign neoplasm of skin of left lower limb, including hip: Secondary | ICD-10-CM | POA: Diagnosis not present

## 2018-12-25 DIAGNOSIS — D485 Neoplasm of uncertain behavior of skin: Secondary | ICD-10-CM | POA: Diagnosis not present

## 2018-12-25 DIAGNOSIS — L821 Other seborrheic keratosis: Secondary | ICD-10-CM | POA: Diagnosis not present

## 2018-12-25 DIAGNOSIS — D2271 Melanocytic nevi of right lower limb, including hip: Secondary | ICD-10-CM | POA: Diagnosis not present

## 2018-12-25 DIAGNOSIS — D1801 Hemangioma of skin and subcutaneous tissue: Secondary | ICD-10-CM | POA: Diagnosis not present

## 2018-12-26 DIAGNOSIS — Z23 Encounter for immunization: Secondary | ICD-10-CM | POA: Diagnosis not present

## 2018-12-28 IMAGING — DX DG RIBS W/ CHEST 3+V BILAT
5 series · 5 of 5 positions shown · non-contrast
Comparison: Chest x-rays dated 08/18/2014 and 06/24/2008.

CLINICAL DATA: Injury to the anterior chest/ribs bilaterally after
fall 03/28/18 - left worse than the right, HTN, DM (patient taking
report to office now)

EXAM:
BILATERAL RIBS AND CHEST - 4+ VIEW

[dg ribs bilateral w/chest (1 of 5)]
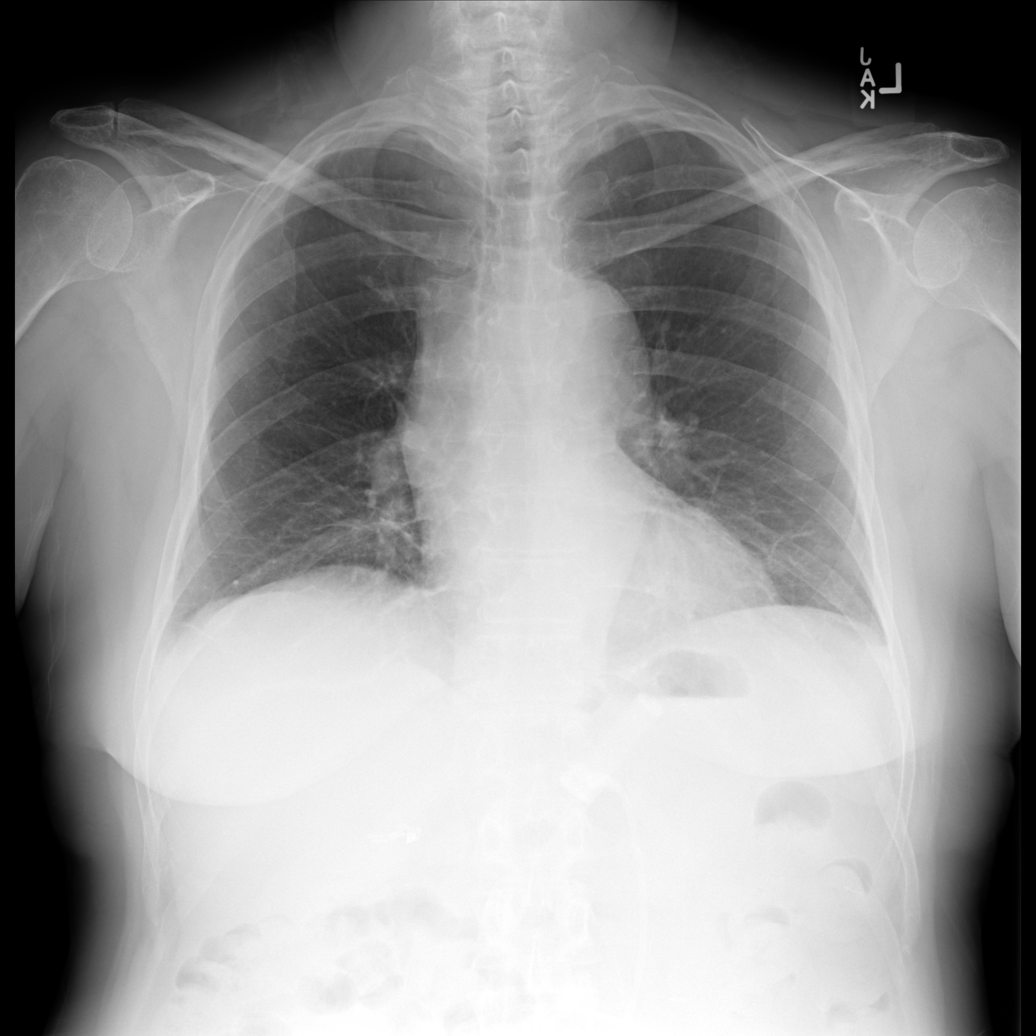

[dg ribs bilateral w/chest (2 of 5)]
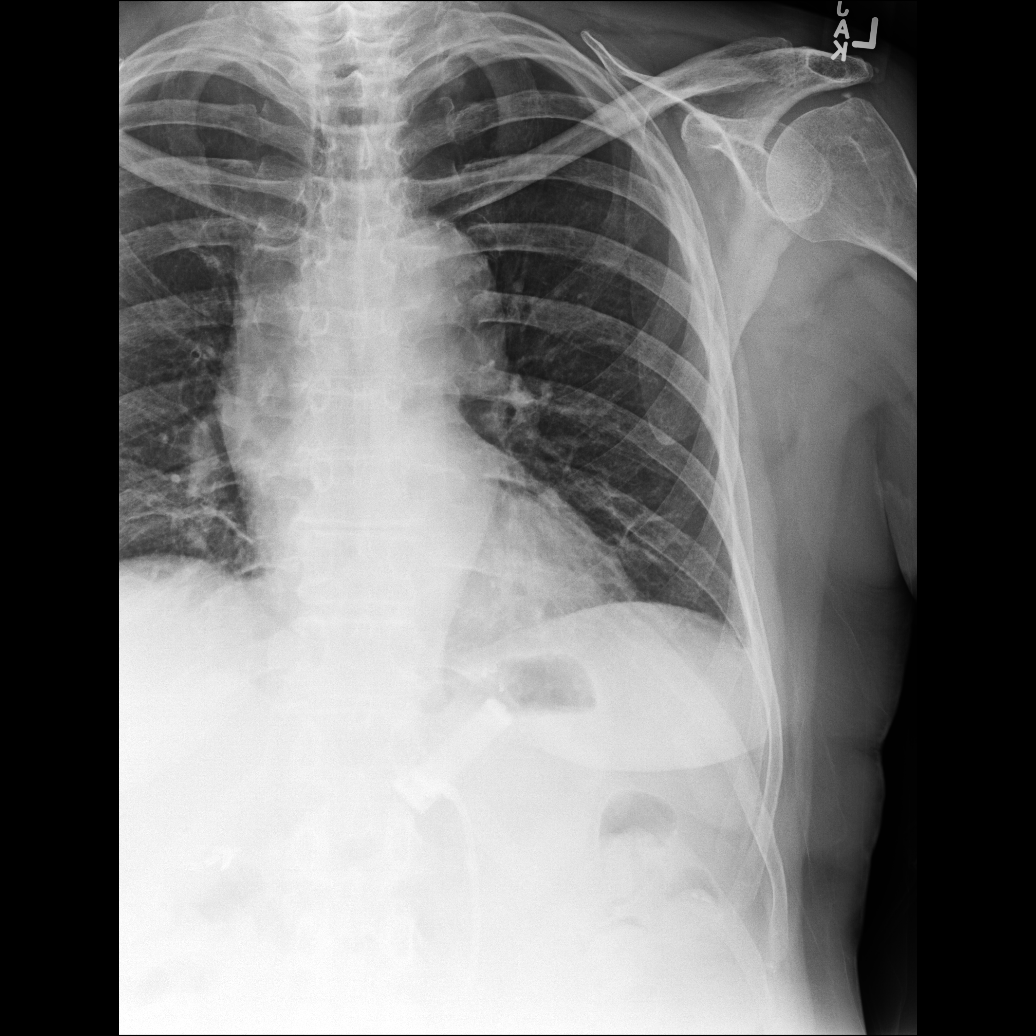

[dg ribs bilateral w/chest (3 of 5)]
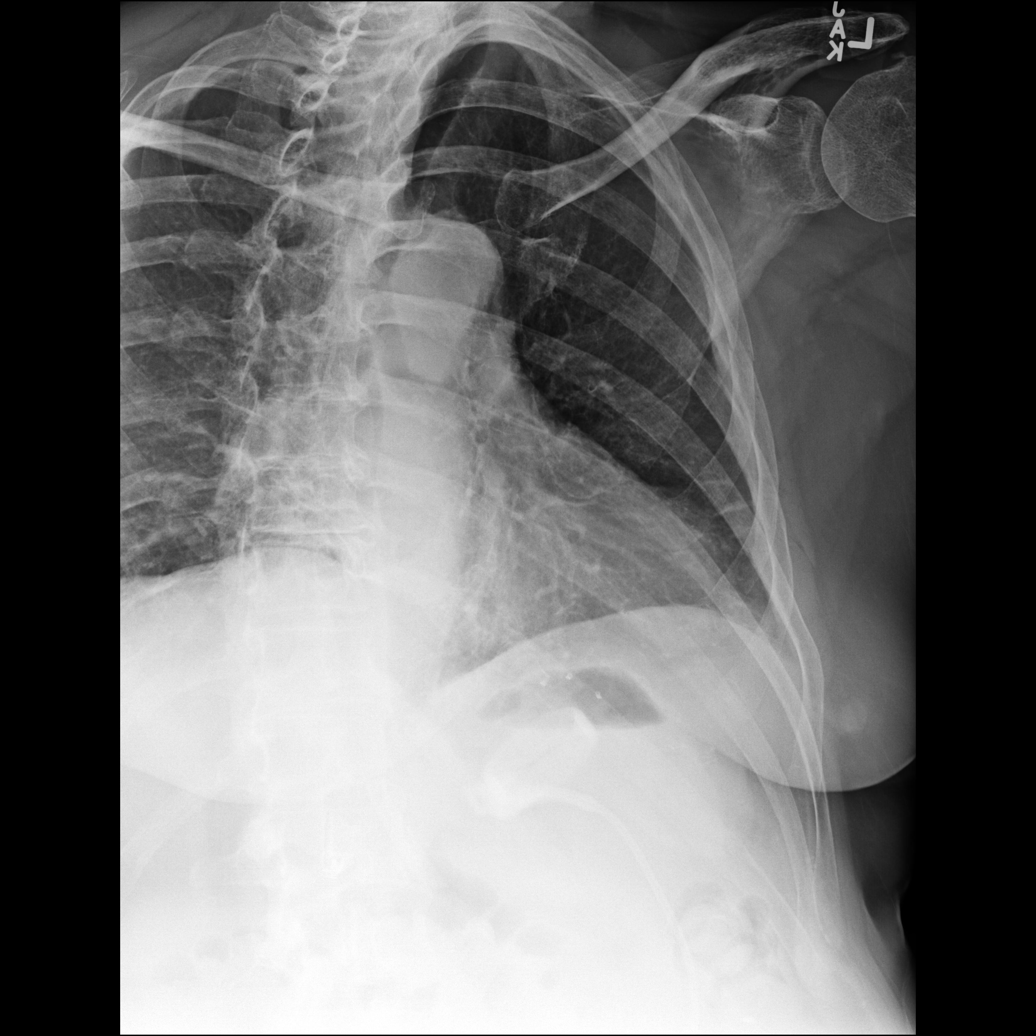

[dg ribs bilateral w/chest (4 of 5)]
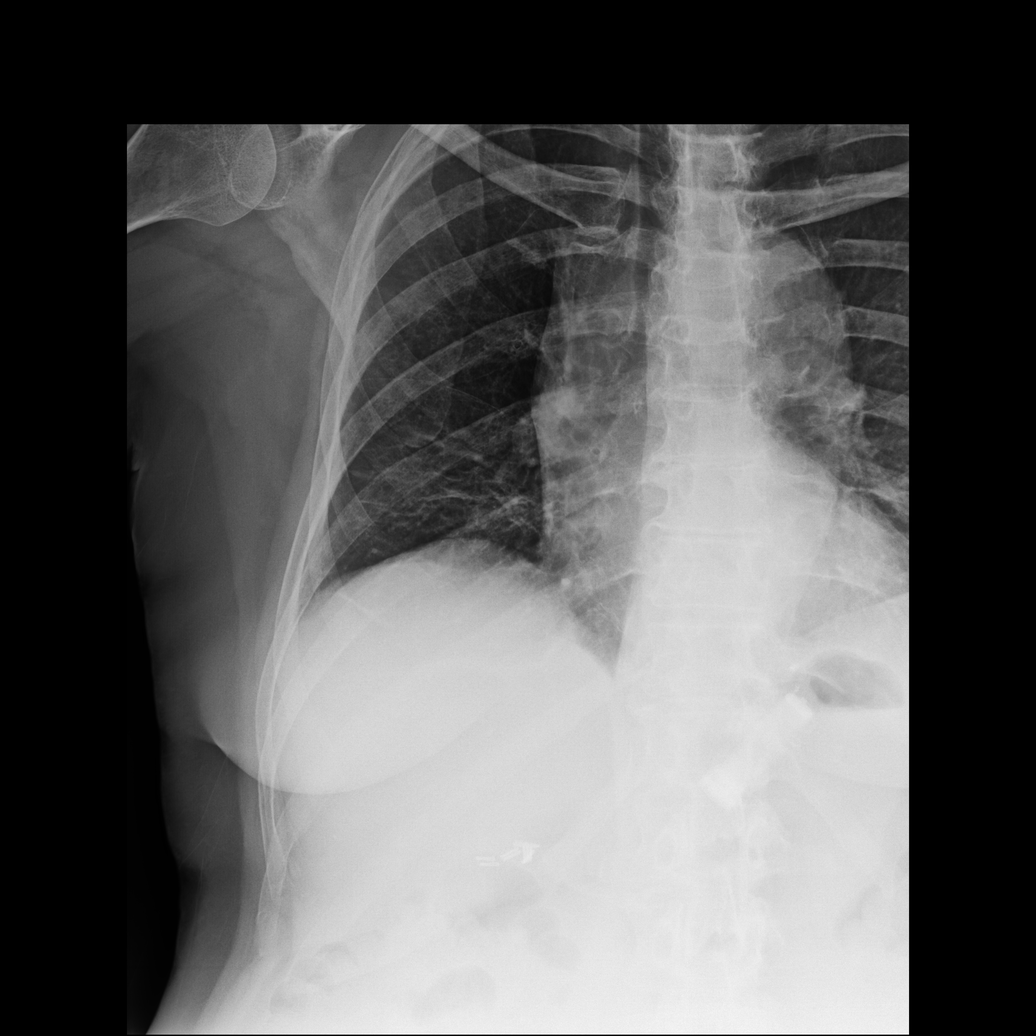

[dg ribs bilateral w/chest (5 of 5)]
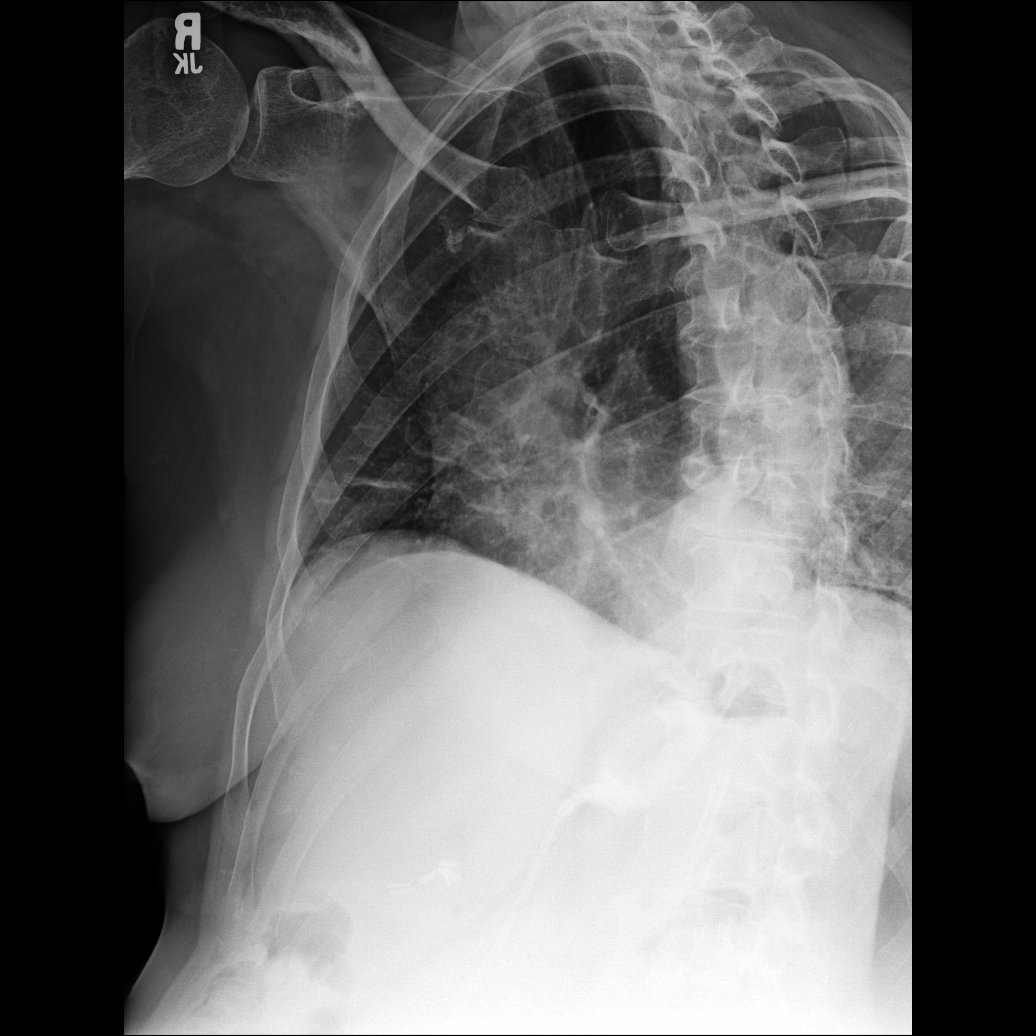

[5 of 5 positions shown; findings below may reference images not displayed]

FINDINGS: Heart size is within normal limits. Lungs are clear. No pleural
effusion or pneumothorax seen. Nondisplaced fracture of an upper
lateral LEFT rib, most likely the fourth rib. No additional rib
fractures identified bilaterally.
IMPRESSION: 1. Slightly displaced fracture of an upper lateral LEFT rib, most
likely the LEFT fourth rib.
2. No RIGHT-sided rib fracture or displacement seen.
3. Lungs are clear and there is no evidence of acute cardiopulmonary
abnormality. No pleural effusion or pneumothorax seen.

## 2018-12-31 DIAGNOSIS — G5603 Carpal tunnel syndrome, bilateral upper limbs: Secondary | ICD-10-CM | POA: Diagnosis not present

## 2018-12-31 DIAGNOSIS — M84375A Stress fracture, left foot, initial encounter for fracture: Secondary | ICD-10-CM | POA: Diagnosis not present

## 2018-12-31 DIAGNOSIS — M65312 Trigger thumb, left thumb: Secondary | ICD-10-CM | POA: Diagnosis not present

## 2019-01-01 ENCOUNTER — Other Ambulatory Visit: Payer: Self-pay | Admitting: Internal Medicine

## 2019-01-01 DIAGNOSIS — Z1231 Encounter for screening mammogram for malignant neoplasm of breast: Secondary | ICD-10-CM

## 2019-01-03 DIAGNOSIS — M79672 Pain in left foot: Secondary | ICD-10-CM | POA: Diagnosis not present

## 2019-01-09 DIAGNOSIS — G4733 Obstructive sleep apnea (adult) (pediatric): Secondary | ICD-10-CM | POA: Diagnosis not present

## 2019-01-13 DIAGNOSIS — G5603 Carpal tunnel syndrome, bilateral upper limbs: Secondary | ICD-10-CM | POA: Diagnosis not present

## 2019-01-17 DIAGNOSIS — M79672 Pain in left foot: Secondary | ICD-10-CM | POA: Diagnosis not present

## 2019-01-20 ENCOUNTER — Other Ambulatory Visit: Payer: Self-pay

## 2019-01-20 ENCOUNTER — Ambulatory Visit (INDEPENDENT_AMBULATORY_CARE_PROVIDER_SITE_OTHER): Payer: Medicare Other | Admitting: Obstetrics and Gynecology

## 2019-01-20 ENCOUNTER — Encounter: Payer: Self-pay | Admitting: Obstetrics and Gynecology

## 2019-01-20 VITALS — BP 102/60 | HR 76 | Temp 97.0°F | Resp 12 | Ht 63.5 in | Wt 146.0 lb

## 2019-01-20 DIAGNOSIS — Z01419 Encounter for gynecological examination (general) (routine) without abnormal findings: Secondary | ICD-10-CM

## 2019-01-20 DIAGNOSIS — E039 Hypothyroidism, unspecified: Secondary | ICD-10-CM | POA: Diagnosis not present

## 2019-01-20 DIAGNOSIS — R6882 Decreased libido: Secondary | ICD-10-CM | POA: Diagnosis not present

## 2019-01-20 DIAGNOSIS — D808 Other immunodeficiencies with predominantly antibody defects: Secondary | ICD-10-CM | POA: Diagnosis not present

## 2019-01-20 DIAGNOSIS — E785 Hyperlipidemia, unspecified: Secondary | ICD-10-CM | POA: Diagnosis not present

## 2019-01-20 DIAGNOSIS — Z124 Encounter for screening for malignant neoplasm of cervix: Secondary | ICD-10-CM | POA: Diagnosis not present

## 2019-01-20 DIAGNOSIS — Z9884 Bariatric surgery status: Secondary | ICD-10-CM | POA: Diagnosis not present

## 2019-01-20 DIAGNOSIS — D649 Anemia, unspecified: Secondary | ICD-10-CM | POA: Diagnosis not present

## 2019-01-20 DIAGNOSIS — E1129 Type 2 diabetes mellitus with other diabetic kidney complication: Secondary | ICD-10-CM | POA: Diagnosis not present

## 2019-01-20 DIAGNOSIS — M533 Sacrococcygeal disorders, not elsewhere classified: Secondary | ICD-10-CM | POA: Diagnosis not present

## 2019-01-20 DIAGNOSIS — M353 Polymyalgia rheumatica: Secondary | ICD-10-CM | POA: Diagnosis not present

## 2019-01-20 DIAGNOSIS — N183 Chronic kidney disease, stage 3 (moderate): Secondary | ICD-10-CM | POA: Diagnosis not present

## 2019-01-20 MED ORDER — ESTRADIOL 2 MG VA RING: 2.0000 mg | VAGINAL_RING | VAGINAL | 3 refills | Status: DC

## 2019-01-20 NOTE — Progress Notes (Signed)
68 y.o. G2P2 Married Caucasian female here for annual exam.    Not using vaginal estrogen and testosterone.  Not sexually active. She would like to test her testosterone level.  She stopped Effexor, which she used to treat hot flashes.  Used gabapentin in past, and she felt loopy.   Stopped steroids and has lot weight.   She is having a bone implant and after that will do Prolia injections through her PCP.  States good bladder and bowel control.  PCP: Josetta Huddle, MD   No LMP recorded (lmp unknown). Patient has had a hysterectomy.           Sexually active: yes.  Not active right now.  The current method of family planning is status post hysterectomy.    Exercising: No.  The patient does not participate in regular exercise at present. Smoker:  no  Health Maintenance: Pap:  09-07-16 Neg History of abnormal Pap:  yes MMG:  03/06/18 BIRADS 1 negative/density b -- scheduled 03/10/19 Colonoscopy:  Per patient in the past 2 years -- done with Dr. Earlean Shawl BMD:   2019  Result  Osteopenia TDaP:  UTD per patient  Gardasil:   n/a HIV and Hep C: 02/20/11 Negative Screening Labs:  PCP Flu vaccine completed.    reports that she has never smoked. She has never used smokeless tobacco. She reports that she does not drink alcohol or use drugs.  Past Medical History:  Diagnosis Date  . Abnormal Pap smear of cervix    Hx of cryotherapy to cervix in her 4s  . Abnormal uterine bleeding    history of fibroids  . Anemia   . Chronic kidney disease    stage 2  . Diabetes mellitus without complication ([place])    type 2 diagnosed 3 years ago  . DJD (degenerative joint disease)   . Fibroid   . Heart murmur    functional  . History of hypertension NO MEDS SINCE LAP GASTRIC BAND 2010 --  WT. LOSS  . Hypertension   . Hypothyroidism   . Low testosterone level in female 2019  . Mast cell disorder diag 1989   PT STATES SHE HAS "INAPPROPIATE MAST CELL ACTIVATION SYNDROME" --WILL USUALLY HAVE N&V  AND THEN NO BLOOD PRESSURE.  STATES SHE CARRIES EPI PEN--BUT HAS NOT HAD ANY RECENT EPISODES.  STATES MANY OF THE MEDICATIONS SHE TAKES ARE TO HELP THIS PROBLEM - INCLUDING  Ketotifen-mast cell inhibitor-not approved in [place].   . Migraine   . 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 Osteopenia   . Other fracture of sacrum, initial encounter for closed fracture ([place]) 06/2017  . PMR (polymyalgia rheumatica) ([facility])   . PONV (postoperative nausea and vomiting)   . Spondylosis     Past Surgical History:  Procedure Laterality Date  . ABDOMINAL HYSTERECTOMY  02/20/2011   Procedure: HYSTERECTOMY ABDOMINAL;  Surgeon: Bennetta Laos, MD;  Location: [facility];  Service: Gynecology;  Laterality: N/A;  . BREAST BIOPSY    . CERVICAL BIOPSY  W/ LOOP ELECTRODE EXCISION  1992  . CERVIX LESION DESTRUCTION    . [place]   X2  . CHOLECYSTECTOMY    . COLONOSCOPY    . DILATION AND CURETTAGE OF UTERUS     heavy menses  . HYSTEROSCOPY  06/1995   HYSTEROSCOPIC MYOMECTOMY  . HYSTEROSCOPY  06/1998   D&C, HYSTEROSCOPY  . KNEE SURGERY     X2 right knee  . LAPAROSCOPIC  GASTRIC BANDING  08-03-2008   [facility] APS SYSTEM  . LESION REMOVAL N/A 09/02/2012   Procedure: EXCISION VAGINAL LESION;  Surgeon: Terrance Mass, MD;  Location: [facility];  Service: Gynecology;  Laterality: N/A;  cpt 57100  one hour  . PUBOVAGINAL SLING  02/20/2011   Procedure: Gaynelle Arabian;  Surgeon: Ailene Rud, MD;  Location: [facility];  Service: Urology;  Laterality: N/A;  . RECTOCELE REPAIR N/A 12/19/2016   Procedure: POSTERIOR REPAIR (RECTOCELE);  Surgeon: Nunzio Cobbs, MD;  Location: [facility];  Service: Gynecology;  Laterality: N/A;  1.25 hours  . SALPINGOOPHORECTOMY  02/20/2011   Procedure: SALPINGO OOPHERECTOMY;  Surgeon: Bennetta Laos, MD;  Location: [facility];  Service: Gynecology;  Laterality: Bilateral;  . UPPER GASTROINTESTINAL ENDOSCOPY  06/2011  .  VENTRAL HERNIA REPAIR  05/24/2011   Procedure: LAPAROSCOPIC VENTRAL HERNIA;  Surgeon: Pedro Earls, MD;  Location: [facility];  Service: General;  Laterality: N/A;  . WOUND EXPLORATION Left 02/22/2018   Procedure: EXPLORATION OF MASS  BEHIND LEFT EAR;  Surgeon: Johnathan Hausen, MD;  Location: [facility];  Service: General;  Laterality: Left;    Current Outpatient Medications  Medication Sig Dispense Refill  . cetirizine (ZYRTEC) 10 MG tablet Take 10 mg by mouth daily.     . clonazePAM (KLONOPIN) 2 MG tablet Take 1 tablet by mouth as needed.    Marland Kitchen EPINEPHrine (EPI-PEN) 0.3 mg/0.3 mL DEVI Inject into the muscle as needed.    . famotidine (PEPCID) 10 MG tablet Take 10 mg by mouth 2 (two) times daily.    . hydrOXYzine (ATARAX/VISTARIL) 50 MG tablet Take 1 tablet by mouth 2 (two) times daily.    . hyoscyamine (LEVSIN SL) 0.125 MG SL tablet Place 1 tablet under the tongue as needed.    Marland Kitchen levothyroxine (SYNTHROID) 125 MCG tablet Take 125 mcg by mouth daily before breakfast. Take 1 tablet 6 times weekly    . losartan (COZAAR) 25 MG tablet Take 2 tablets by mouth daily.    . montelukast (SINGULAIR) 10 MG tablet Take 10 mg by mouth at bedtime.     . ondansetron (ZOFRAN-ODT) 4 MG disintegrating tablet Take 1 tablet by mouth as needed.    Marland Kitchen PRESCRIPTION MEDICATION Take 1 tablet by mouth 2 (two) times daily. Ketotifen 5mg  tablet[place]. For mast cell problem    . Rosuvastatin Calcium 20 MG CPSP     . Semaglutide, 1 MG/DOSE, (OZEMPIC, 1 MG/DOSE,) 2 MG/1.5ML SOPN     . topiramate (TOPAMAX) 50 MG tablet Take 50 mg by mouth 2 (two) times daily.    Marland Kitchen UNABLE TO FIND 2 capsules 2 (two) times daily. Med Name: AlgaeCal Plus    . VASCEPA 1 g CAPS Take 1 capsule by mouth 2 (two) times daily.    Marland Kitchen zolmitriptan (ZOMIG) 5 MG tablet Take 5 mg by mouth as needed for migraine.     No current facility-administered medications for this visit.     Family History  Problem Relation  Age of Onset  . Breast cancer Mother        41's  . Hypertension Mother   . Thyroid disease Mother        hypothyroid  . Cancer Father        LUNG  . Thyroid disease Maternal Grandmother        hypothyroid    Review of Systems  Constitutional: Negative.   HENT: Negative.  Eyes: Negative.   Respiratory: Negative.   Cardiovascular: Negative.   Gastrointestinal: Negative.   Endocrine: Negative.   Genitourinary: Negative.   Musculoskeletal: Negative.   Skin: Negative.   Allergic/Immunologic: Negative.   Neurological: Negative.   Hematological: Negative.   Psychiatric/Behavioral: Negative.     Exam:   BP 102/60 (BP Location: Right Arm, Patient Position: Sitting, Cuff Size: Normal)   Pulse 76   Temp (!) 97 F (36.1 C) (Temporal)   Resp 12   Ht 5' 3.5" (1.613 m)   Wt 146 lb (66.2 kg)   LMP  (LMP Unknown)   BMI 25.46 kg/m     General appearance: alert, cooperative and appears stated age Head: normocephalic, without obvious abnormality, atraumatic Neck: no adenopathy, supple, symmetrical, trachea midline and thyroid normal to inspection and palpation Lungs: clear to auscultation bilaterally Breasts: normal appearance, no masses or tenderness, No nipple retraction or dimpling, No nipple discharge or bleeding, No axillary adenopathy Heart: regular rate and rhythm Abdomen: soft, non-tender; no masses, no organomegaly Extremities: extremities normal, atraumatic, no cyanosis or edema Skin: skin color, texture, turgor normal. No rashes or lesions Lymph nodes: cervical, supraclavicular, and axillary nodes normal. Neurologic: grossly normal  Pelvic: External genitalia:  no lesions              No abnormal inguinal nodes palpated.              Urethra:  normal appearing urethra with no masses, tenderness or lesions              Bartholins and Skenes: normal                 Vagina: normal appearing vagina with pale pink color and discharge, no lesions              Cervix:   absent              Pap taken: No. Bimanual Exam:  Uterus:  Absent.               Adnexa: no mass, fullness, tenderness              Rectal exam: Yes.  .  Confirms.              Anus:  normal sphincter tone, no lesions  Chaperone was present for exam.  Assessment:   Well woman visit with normal exam. Status post total abdominal hysterectomy for fibroids. Status post BSO.  Status post midurethral sling. Status post rectocele repair. Tight introitus.  Bilateral sacral fracture 06/2017.  Hx of abnormal paps and cryo, LEEP, and conization of cervix. Decreased libido.  Diabetes.  Improved off steroids.  Weight loss.  Osteopenia.  Will start Prolia.   Plan: Mammogram screening discussed. Self breast awareness reviewed. Pap and HR HPV as above. Guidelines for Calcium, Vitamin D, regular exercise program including cardiovascular and weight bearing exercise. Will check testosterone level. She would be open to testosterone tx and wellbutrin.  Follow up annually and prn.   After visit summary provided.

## 2019-01-20 NOTE — Patient Instructions (Signed)

## 2019-01-23 DIAGNOSIS — D649 Anemia, unspecified: Secondary | ICD-10-CM | POA: Diagnosis not present

## 2019-01-23 DIAGNOSIS — N183 Chronic kidney disease, stage 3 (moderate): Secondary | ICD-10-CM | POA: Diagnosis not present

## 2019-01-23 DIAGNOSIS — E039 Hypothyroidism, unspecified: Secondary | ICD-10-CM | POA: Diagnosis not present

## 2019-01-23 DIAGNOSIS — E119 Type 2 diabetes mellitus without complications: Secondary | ICD-10-CM | POA: Diagnosis not present

## 2019-01-23 DIAGNOSIS — E7800 Pure hypercholesterolemia, unspecified: Secondary | ICD-10-CM | POA: Diagnosis not present

## 2019-01-23 DIAGNOSIS — I10 Essential (primary) hypertension: Secondary | ICD-10-CM | POA: Diagnosis not present

## 2019-01-26 LAB — TESTOSTERONE, FREE, DIRECT
Testosterone, Free: <0.2 pg/mL (ref 0.0–4.2)
Testosterone, Total, LC/MS: <2.5 ng/dL — ABNORMAL LOW (ref 7.0–40.0)

## 2019-01-28 ENCOUNTER — Encounter: Payer: Self-pay | Admitting: Obstetrics and Gynecology

## 2019-01-28 ENCOUNTER — Telehealth: Payer: Self-pay | Admitting: *Deleted

## 2019-01-28 ENCOUNTER — Telehealth: Payer: Self-pay | Admitting: Obstetrics and Gynecology

## 2019-01-28 NOTE — Telephone Encounter (Signed)
Carder, Angie Fava N routed conversation to [facility] Triage Pool 29 minutes ago (1:48 PM)    Carder, Hayley N 29 minutes ago (1:48 PM)     Patient is calling regarding testosterone results. Patient stated that Dr. Quincy Simmonds informed her to contact her if she was interested in being treated

## 2019-01-28 NOTE — Telephone Encounter (Signed)
Patient is calling regarding testosterone results. Patient stated that Dr. Quincy Simmonds informed her to contact her if she was interested in being treated.

## 2019-01-28 NOTE — Telephone Encounter (Signed)
Left message to call Sharee Pimple, RN at [facility] [phone].   01/20/19 Testosterone <2.5   Rx will need to go to compounding pharmacy, please confirm where she wants Rx to be sent.

## 2019-01-28 NOTE — Telephone Encounter (Signed)
Encounter closed in error, see second encounter dated 01/28/19.

## 2019-01-29 MED ORDER — BUPROPION HCL ER (XL) 150 MG PO TB24: 150.0000 mg | ORAL_TABLET | Freq: Every day | ORAL | 2 refills | Status: DC

## 2019-01-29 NOTE — Telephone Encounter (Signed)
Ok for Wellbutrin XL 150 mg daily.  Dispense:  30, RF 2.   I will see her on 02/03/19 to discuss alternative to the Estring and discuss potential surgery.

## 2019-01-29 NOTE — Telephone Encounter (Signed)
Patient left voicemail during lunch returning call to [place].

## 2019-01-29 NOTE — Telephone Encounter (Signed)
Prescription for wellbutrin #30, 2RF sent to confirmed pharmacy on file.  Detailed message left per DPR advising patient prescription sent to pharmacy on file.    Will close encounter.

## 2019-01-29 NOTE — Telephone Encounter (Signed)
Returned call to patient. Patient states she had discussed a prescription for wellbutrin with Dr. Quincy Simmonds. Interested in trying that option now. [facility]. Patient also states she was unable to insert Estring. Asking for alternatives. Also has questions and wanted to discuss with Dr. Quincy Simmonds the surgery to help with the vaginal tightness. OV scheduled for 02-03-2019 at 1530 to discuss surgical options. Patient agreeable to date and time of appointment.   Routing to provider to review and advise on prescription for wellbutin.

## 2019-02-03 ENCOUNTER — Ambulatory Visit: Payer: Medicare Other | Admitting: Obstetrics and Gynecology

## 2019-02-03 ENCOUNTER — Other Ambulatory Visit: Payer: Self-pay

## 2019-02-03 NOTE — Progress Notes (Deleted)
GYNECOLOGY  VISIT   HPI: 68 y.o.   Married  Caucasian  female   G2P2 with No LMP recorded (lmp unknown). Patient has had a hysterectomy.   here to discuss surgical options  GYNECOLOGIC HISTORY: No LMP recorded (lmp unknown). Patient has had a hysterectomy. Contraception:  Hysterectomy Menopausal hormone therapy:  *** Last mammogram: 03/06/18 BIRADS 1 negative/density b -- scheduled 03/10/19 Last pap smear:  09-07-16 Neg        OB History    Gravida  2   Para  2   Term      Preterm      AB      Living  2     SAB      TAB      Ectopic      Multiple      Live Births                 Patient Active Problem List   Diagnosis Date Noted  . Murmur 04/14/2017  . Postmenopausal HRT (hormone replacement therapy) 10/29/2014  . Hot flashes, menopausal 10/29/2014  . Fitting and adjustment of gastric lap band 01/14/2013  . Obese 01/14/2013  . H/O vitamin D deficiency 08/09/2012  . Unspecified hypothyroidism 08/09/2012  . Menopause 08/09/2012  . Postoperative seroma-in ventral hernia repair site 07/20/2011  . Lapband APS April 2010 07/20/2011  . Hypertension   . Mast cell disorder   . DEGENERATIVE JOINT DISEASE 02/19/2008  . SPONDYLOSIS UNSPEC SITE W/O MENTION MYELOPATHY 02/19/2008  . SINUS TARSI SYNDROME 02/19/2008  . UNEQUAL LEG LENGTH 02/19/2008    Past Medical History:  Diagnosis Date  . Abnormal Pap smear of cervix    Hx of cryotherapy to cervix in her 55s  . Abnormal uterine bleeding    history of fibroids  . Anemia   . Chronic kidney disease    stage 2  . Diabetes mellitus without complication ([place])    type 2 diagnosed 3 years ago  . DJD (degenerative joint disease)   . Fibroid   . Heart murmur    functional  . History of hypertension NO MEDS SINCE LAP GASTRIC BAND 2010 --  WT. LOSS  . Hypertension   . Hypothyroidism   . Low testosterone level in female 2019  . Low testosterone level in female   . 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  STATES SHE CARRIES EPI PEN--BUT HAS NOT HAD ANY RECENT EPISODES.  STATES MANY OF THE MEDICATIONS SHE TAKES ARE TO HELP THIS PROBLEM - INCLUDING  Ketotifen-mast cell inhibitor-not approved in [place].   . Migraine   . 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 Osteopenia   . Other fracture of sacrum, initial encounter for closed fracture ([place]) 06/2017  . PMR (polymyalgia rheumatica) ([facility])   . PONV (postoperative nausea and vomiting)   . Spondylosis     Past Surgical History:  Procedure Laterality Date  . ABDOMINAL HYSTERECTOMY  02/20/2011   Procedure: HYSTERECTOMY ABDOMINAL;  Surgeon: Bennetta Laos, MD;  Location: [facility];  Service: Gynecology;  Laterality: N/A;  . BREAST BIOPSY    . CERVICAL BIOPSY  W/ LOOP ELECTRODE EXCISION  1992  . CERVIX LESION DESTRUCTION    . [place]   X2  . CHOLECYSTECTOMY    . COLONOSCOPY    . DILATION AND CURETTAGE OF  UTERUS     heavy menses  . HYSTEROSCOPY  06/1995   HYSTEROSCOPIC MYOMECTOMY  . HYSTEROSCOPY  06/1998   D&C, HYSTEROSCOPY  . KNEE SURGERY     X2 right knee  . LAPAROSCOPIC GASTRIC BANDING  08-03-2008   [facility] APS SYSTEM  . LESION REMOVAL N/A 09/02/2012   Procedure: EXCISION VAGINAL LESION;  Surgeon: Terrance Mass, MD;  Location: [facility];  Service: Gynecology;  Laterality: N/A;  cpt 57100  one hour  . PUBOVAGINAL SLING  02/20/2011   Procedure: Gaynelle Arabian;  Surgeon: Ailene Rud, MD;  Location: [facility];  Service: Urology;  Laterality: N/A;  . RECTOCELE REPAIR N/A 12/19/2016   Procedure: POSTERIOR REPAIR (RECTOCELE);  Surgeon: Nunzio Cobbs, MD;  Location: [facility];  Service: Gynecology;  Laterality: N/A;  1.25 hours  . SALPINGOOPHORECTOMY  02/20/2011   Procedure: SALPINGO OOPHERECTOMY;  Surgeon: Bennetta Laos, MD;  Location: [facility];  Service: Gynecology;   Laterality: Bilateral;  . UPPER GASTROINTESTINAL ENDOSCOPY  06/2011  . VENTRAL HERNIA REPAIR  05/24/2011   Procedure: LAPAROSCOPIC VENTRAL HERNIA;  Surgeon: Pedro Earls, MD;  Location: [facility];  Service: General;  Laterality: N/A;  . WOUND EXPLORATION Left 02/22/2018   Procedure: EXPLORATION OF MASS  BEHIND LEFT EAR;  Surgeon: Johnathan Hausen, MD;  Location: [facility];  Service: General;  Laterality: Left;    Current Outpatient Medications  Medication Sig Dispense Refill  . buPROPion (WELLBUTRIN XL) 150 MG 24 hr tablet Take 1 tablet (150 mg total) by mouth daily. 30 tablet 2  . cetirizine (ZYRTEC) 10 MG tablet Take 10 mg by mouth daily.     . clonazePAM (KLONOPIN) 2 MG tablet Take 1 tablet by mouth as needed.    Marland Kitchen EPINEPHrine (EPI-PEN) 0.3 mg/0.3 mL DEVI Inject into the muscle as needed.    Marland Kitchen estradiol (ESTRING) 2 MG vaginal ring Place 2 mg vaginally every 3 (three) months. Insert a new ring into vagina every 3 months 1 each 3  . famotidine (PEPCID) 10 MG tablet Take 10 mg by mouth 2 (two) times daily.    . hydrOXYzine (ATARAX/VISTARIL) 50 MG tablet Take 1 tablet by mouth 2 (two) times daily.    . hyoscyamine (LEVSIN SL) 0.125 MG SL tablet Place 1 tablet under the tongue as needed.    Marland Kitchen levothyroxine (SYNTHROID) 125 MCG tablet Take 125 mcg by mouth daily before breakfast. Take 1 tablet 6 times weekly    . losartan (COZAAR) 25 MG tablet Take 2 tablets by mouth daily.    . montelukast (SINGULAIR) 10 MG tablet Take 10 mg by mouth at bedtime.     . ondansetron (ZOFRAN-ODT) 4 MG disintegrating tablet Take 1 tablet by mouth as needed.    Marland Kitchen PRESCRIPTION MEDICATION Take 1 tablet by mouth 2 (two) times daily. Ketotifen 5mg  tablet[place]. For mast cell problem    . Rosuvastatin Calcium 20 MG CPSP     . Semaglutide, 1 MG/DOSE, (OZEMPIC, 1 MG/DOSE,) 2 MG/1.5ML SOPN     . topiramate (TOPAMAX) 50 MG tablet Take 50 mg by mouth 2 (two) times daily.    Marland Kitchen UNABLE TO  FIND 2 capsules 2 (two) times daily. Med Name: AlgaeCal Plus    . VASCEPA 1 g CAPS Take 1 capsule by mouth 2 (two) times daily.    Marland Kitchen zolmitriptan (ZOMIG) 5 MG tablet Take 5 mg by mouth as needed for migraine.  No current facility-administered medications for this visit.      ALLERGIES: Demerol  Family History  Problem Relation Age of Onset  . Breast cancer Mother        73's  . Hypertension Mother   . Thyroid disease Mother        hypothyroid  . Cancer Father        LUNG  . Thyroid disease Maternal Grandmother        hypothyroid    Social History   Socioeconomic History  . Marital status: Married    Spouse name: Not on file  . Number of children: Not on file  . Years of education: Not on file  . Highest education level: Not on file  Occupational History  . Not on file  Social Needs  . Financial resource strain: Not on file  . Food insecurity    Worry: Not on file    Inability: Not on file  . Transportation needs    Medical: Not on file    Non-medical: Not on file  Tobacco Use  . Smoking status: Never Smoker  . Smokeless tobacco: Never Used  Substance and Sexual Activity  . Alcohol use: No    Alcohol/week: 0.0 standard drinks    Comment: social  . Drug use: No  . Sexual activity: Yes    Partners: Male    Birth control/protection: Surgical    Comment: TAH/BSO  Lifestyle  . Physical activity    Days per week: Not on file    Minutes per session: Not on file  . Stress: Not on file  Relationships  . Social Herbalist on phone: Not on file    Gets together: Not on file    Attends religious service: Not on file    Active member of club or organization: Not on file    Attends meetings of clubs or organizations: Not on file    Relationship status: Not on file  . Intimate partner violence    Fear of current or ex partner: Not on file    Emotionally abused: Not on file    Physically abused: Not on file    Forced sexual activity: Not on file   Other Topics Concern  . Not on file  Social History Narrative   Lives with husband    Review of Systems  PHYSICAL EXAMINATION:    LMP  (LMP Unknown)     General appearance: alert, cooperative and appears stated age Head: Normocephalic, without obvious abnormality, atraumatic Neck: no adenopathy, supple, symmetrical, trachea midline and thyroid normal to inspection and palpation Lungs: clear to auscultation bilaterally Breasts: normal appearance, no masses or tenderness, No nipple retraction or dimpling, No nipple discharge or bleeding, No axillary or supraclavicular adenopathy Heart: regular rate and rhythm Abdomen: soft, non-tender, no masses,  no organomegaly Extremities: extremities normal, atraumatic, no cyanosis or edema Skin: Skin color, texture, turgor normal. No rashes or lesions Lymph nodes: Cervical, supraclavicular, and axillary nodes normal. No abnormal inguinal nodes palpated Neurologic: Grossly normal  Pelvic: External genitalia:  no lesions              Urethra:  normal appearing urethra with no masses, tenderness or lesions              Bartholins and Skenes: normal                 Vagina: normal appearing vagina with normal color and discharge, no lesions  Cervix: no lesions                Bimanual Exam:  Uterus:  normal size, contour, position, consistency, mobility, non-tender              Adnexa: no mass, fullness, tenderness              Rectal exam: {yes no:[id number]}.  Confirms.              Anus:  normal sphincter tone, no lesions  Chaperone was present for exam.  ASSESSMENT     PLAN     An After Visit Summary was printed and given to the patient.  ______ minutes face to face time of which over 50% was spent in counseling.

## 2019-02-04 ENCOUNTER — Encounter: Payer: Self-pay | Admitting: Obstetrics and Gynecology

## 2019-02-04 ENCOUNTER — Ambulatory Visit (INDEPENDENT_AMBULATORY_CARE_PROVIDER_SITE_OTHER): Payer: Medicare Other | Admitting: Obstetrics and Gynecology

## 2019-02-04 VITALS — BP 124/66 | HR 70 | Temp 97.3°F | Ht 63.75 in | Wt 148.8 lb

## 2019-02-04 DIAGNOSIS — N952 Postmenopausal atrophic vaginitis: Secondary | ICD-10-CM | POA: Diagnosis not present

## 2019-02-04 DIAGNOSIS — R6882 Decreased libido: Secondary | ICD-10-CM | POA: Diagnosis not present

## 2019-02-04 DIAGNOSIS — N941 Unspecified dyspareunia: Secondary | ICD-10-CM

## 2019-02-04 MED ORDER — ESTRADIOL 0.1 MG/GM VA CREA: TOPICAL_CREAM | VAGINAL | 1 refills | Status: DC

## 2019-02-04 NOTE — Progress Notes (Signed)
GYNECOLOGY  VISIT   HPI: 68 y.o.   Married  Caucasian  female   G2P2 with No LMP recorded (lmp unknown). Patient has had a hysterectomy.   here to discuss surgery options and options to Estring.   The Estring would not go in far enough.    She had a rectocele repair and now has a tight introitus.  Unable to have intercourse.  Has done dilator therapy. She is considering surgery.  Her testosterone total measures <2.5 and her free testosterone measured <0.2.   She has just started with Wellbutrin for decreased sexual desire.   GYNECOLOGIC HISTORY: No LMP recorded (lmp unknown). Patient has had a hysterectomy. Contraception:  Hysterectomy Menopausal hormone therapy:  NA  Last mammogram: 03/06/18 BIRADS 1 negative/density b -- scheduled 03/10/19 Last pap smear: 09-07-16 Neg        OB History    Gravida  2   Para  2   Term      Preterm      AB      Living  2     SAB      TAB      Ectopic      Multiple      Live Births                 Patient Active Problem List   Diagnosis Date Noted  . Murmur 04/14/2017  . Postmenopausal HRT (hormone replacement therapy) 10/29/2014  . Hot flashes, menopausal 10/29/2014  . Fitting and adjustment of gastric lap band 01/14/2013  . Obese 01/14/2013  . H/O vitamin D deficiency 08/09/2012  . Unspecified hypothyroidism 08/09/2012  . Menopause 08/09/2012  . Postoperative seroma-in ventral hernia repair site 07/20/2011  . Lapband APS April 2010 07/20/2011  . Hypertension   . Mast cell disorder   . DEGENERATIVE JOINT DISEASE 02/19/2008  . SPONDYLOSIS UNSPEC SITE W/O MENTION MYELOPATHY 02/19/2008  . SINUS TARSI SYNDROME 02/19/2008  . UNEQUAL LEG LENGTH 02/19/2008    Past Medical History:  Diagnosis Date  . Abnormal Pap smear of cervix    Hx of cryotherapy to cervix in her 37s  . Abnormal uterine bleeding    history of fibroids  . Anemia   . Chronic kidney disease    stage 2  . Diabetes mellitus without complication  ([place])    type 2 diagnosed 3 years ago  . DJD (degenerative joint disease)   . Fibroid   . Heart murmur    functional  . History of hypertension NO MEDS SINCE LAP GASTRIC BAND 2010 --  WT. LOSS  . Hypertension   . Hypothyroidism   . Low testosterone level in female 2019  . Low testosterone level in female   . 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  STATES SHE CARRIES EPI PEN--BUT HAS NOT HAD ANY RECENT EPISODES.  STATES MANY OF THE MEDICATIONS SHE TAKES ARE TO HELP THIS PROBLEM - INCLUDING  Ketotifen-mast cell inhibitor-not approved in [place].   . Migraine   . 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 Osteopenia   . Other fracture of sacrum, initial encounter for closed fracture ([place]) 06/2017  . PMR (polymyalgia rheumatica) ([facility])   . PONV (postoperative nausea and vomiting)   . Spondylosis     Past Surgical History:  Procedure Laterality Date  . ABDOMINAL HYSTERECTOMY  02/20/2011   Procedure: HYSTERECTOMY  ABDOMINAL;  Surgeon: Bennetta Laos, MD;  Location: [facility];  Service: Gynecology;  Laterality: N/A;  . BREAST BIOPSY    . CERVICAL BIOPSY  W/ LOOP ELECTRODE EXCISION  1992  . CERVIX LESION DESTRUCTION    . [place]   X2  . CHOLECYSTECTOMY    . COLONOSCOPY    . DILATION AND CURETTAGE OF UTERUS     heavy menses  . HYSTEROSCOPY  06/1995   HYSTEROSCOPIC MYOMECTOMY  . HYSTEROSCOPY  06/1998   D&C, HYSTEROSCOPY  . KNEE SURGERY     X2 right knee  . LAPAROSCOPIC GASTRIC BANDING  08-03-2008   [facility] APS SYSTEM  . LESION REMOVAL N/A 09/02/2012   Procedure: EXCISION VAGINAL LESION;  Surgeon: Terrance Mass, MD;  Location: [facility];  Service: Gynecology;  Laterality: N/A;  cpt 57100  one hour  . PUBOVAGINAL SLING  02/20/2011   Procedure: Gaynelle Arabian;  Surgeon: Ailene Rud, MD;  Location: [facility];  Service:  Urology;  Laterality: N/A;  . RECTOCELE REPAIR N/A 12/19/2016   Procedure: POSTERIOR REPAIR (RECTOCELE);  Surgeon: Nunzio Cobbs, MD;  Location: [facility];  Service: Gynecology;  Laterality: N/A;  1.25 hours  . SALPINGOOPHORECTOMY  02/20/2011   Procedure: SALPINGO OOPHERECTOMY;  Surgeon: Bennetta Laos, MD;  Location: [facility];  Service: Gynecology;  Laterality: Bilateral;  . UPPER GASTROINTESTINAL ENDOSCOPY  06/2011  . VENTRAL HERNIA REPAIR  05/24/2011   Procedure: LAPAROSCOPIC VENTRAL HERNIA;  Surgeon: Pedro Earls, MD;  Location: [facility];  Service: General;  Laterality: N/A;  . WOUND EXPLORATION Left 02/22/2018   Procedure: EXPLORATION OF MASS  BEHIND LEFT EAR;  Surgeon: Johnathan Hausen, MD;  Location: [facility];  Service: General;  Laterality: Left;    Current Outpatient Medications  Medication Sig Dispense Refill  . buPROPion (WELLBUTRIN XL) 150 MG 24 hr tablet Take 1 tablet (150 mg total) by mouth daily. 30 tablet 2  . cetirizine (ZYRTEC) 10 MG tablet Take 10 mg by mouth daily.     . clonazePAM (KLONOPIN) 2 MG tablet Take 1 tablet by mouth as needed.    Marland Kitchen EPINEPHrine (EPI-PEN) 0.3 mg/0.3 mL DEVI Inject into the muscle as needed.    . famotidine (PEPCID) 10 MG tablet Take 10 mg by mouth 2 (two) times daily.    . hydrOXYzine (ATARAX/VISTARIL) 50 MG tablet Take 1 tablet by mouth 2 (two) times daily.    . hyoscyamine (LEVSIN SL) 0.125 MG SL tablet Place 1 tablet under the tongue as needed.    Marland Kitchen levothyroxine (SYNTHROID) 125 MCG tablet Take 125 mcg by mouth daily before breakfast. Take 1 tablet 6 times weekly    . losartan (COZAAR) 25 MG tablet Take 2 tablets by mouth daily.    . montelukast (SINGULAIR) 10 MG tablet Take 10 mg by mouth at bedtime.     . ondansetron (ZOFRAN-ODT) 4 MG disintegrating tablet Take 1 tablet by mouth as needed.    Marland Kitchen PRESCRIPTION MEDICATION Take 1 tablet by mouth 2 (two) times daily. Ketotifen 5mg  tablet[place].  For mast cell problem    . Rosuvastatin Calcium 20 MG CPSP     . Semaglutide, 1 MG/DOSE, (OZEMPIC, 1 MG/DOSE,) 2 MG/1.5ML SOPN     . topiramate (TOPAMAX) 50 MG tablet Take 50 mg by mouth 2 (two) times daily.    Marland Kitchen UNABLE TO FIND 2 capsules 2 (two) times daily. Med Name: AlgaeCal Plus    .  VASCEPA 1 g CAPS Take 1 capsule by mouth 2 (two) times daily.    Marland Kitchen zolmitriptan (ZOMIG) 5 MG tablet Take 5 mg by mouth as needed for migraine.     No current facility-administered medications for this visit.      ALLERGIES: Demerol  Family History  Problem Relation Age of Onset  . Breast cancer Mother        79's  . Hypertension Mother   . Thyroid disease Mother        hypothyroid  . Cancer Father        LUNG  . Thyroid disease Maternal Grandmother        hypothyroid    Social History   Socioeconomic History  . Marital status: Married    Spouse name: Not on file  . Number of children: Not on file  . Years of education: Not on file  . Highest education level: Not on file  Occupational History  . Not on file  Social Needs  . Financial resource strain: Not on file  . Food insecurity    Worry: Not on file    Inability: Not on file  . Transportation needs    Medical: Not on file    Non-medical: Not on file  Tobacco Use  . Smoking status: Never Smoker  . Smokeless tobacco: Never Used  Substance and Sexual Activity  . Alcohol use: No    Alcohol/week: 0.0 standard drinks    Comment: social  . Drug use: No  . Sexual activity: Yes    Partners: Male    Birth control/protection: Surgical    Comment: TAH/BSO  Lifestyle  . Physical activity    Days per week: Not on file    Minutes per session: Not on file  . Stress: Not on file  Relationships  . Social Herbalist on phone: Not on file    Gets together: Not on file    Attends religious service: Not on file    Active member of club or organization: Not on file    Attends meetings of clubs or organizations: Not on file     Relationship status: Not on file  . Intimate partner violence    Fear of current or ex partner: Not on file    Emotionally abused: Not on file    Physically abused: Not on file    Forced sexual activity: Not on file  Other Topics Concern  . Not on file  Social History Narrative   Lives with husband    Review of Systems  All other systems reviewed and are negative.   PHYSICAL EXAMINATION:    BP 124/66   Pulse 70   Temp (!) 97.3 F (36.3 C) (Temporal)   Ht 5' 3.75" (1.619 m)   Wt 148 lb 12.8 oz (67.5 kg)   LMP  (LMP Unknown)   BMI 25.74 kg/m     General appearance: alert, cooperative and appears stated age   Pelvic: External genitalia:  no lesions              Urethra:  normal appearing urethra with no masses, tenderness or lesions              Bartholins and Skenes: normal                 Vagina: normal appearing vagina with normal color and discharge, no lesions.  Cervix: no lesions                Bimanual Exam:  Uterus:   Absent              The introitus is tight with 2 fingerbreaths.  I can feel the hymen as a tight band.               Adnexa: no mass, fullness, tenderness             Chaperone was present for exam.  ASSESSMENT  Female dyspareunia.  Tight introitus post rectocele repair. Atrophy of vagina.  Decreased desire.  Low total testosterone but normal free testosterone.   PLAN  We discussed surgical repair, essentially a hymenotomy, which the patient will consider.  I discussed risks and benefits of surgery.  Risks include bleeding, infection, damage to surrounding organs, possible effect on the rectocele repair, reaction to anesthesia and pneumonia, DVT, PE, death, and need for reoperation.  Start Estrace cream 1/2 gram pv at hs and then 2 -3 times per week.  Continue Wellbutrin.  I clarified that this will not increase her testosterone level but can increase her sexual desire and sexual satisfaction.  We reviewed her testosterone  levels. Fu in 3 months.    An After Visit Summary was printed and given to the patient.  ___25__ minutes face to face time of which over 50% was spent in counseling.

## 2019-02-20 DIAGNOSIS — K625 Hemorrhage of anus and rectum: Secondary | ICD-10-CM | POA: Diagnosis not present

## 2019-02-24 DIAGNOSIS — M79671 Pain in right foot: Secondary | ICD-10-CM | POA: Diagnosis not present

## 2019-02-26 DIAGNOSIS — E7800 Pure hypercholesterolemia, unspecified: Secondary | ICD-10-CM | POA: Diagnosis not present

## 2019-02-26 DIAGNOSIS — E039 Hypothyroidism, unspecified: Secondary | ICD-10-CM | POA: Diagnosis not present

## 2019-02-26 DIAGNOSIS — E119 Type 2 diabetes mellitus without complications: Secondary | ICD-10-CM | POA: Diagnosis not present

## 2019-02-26 DIAGNOSIS — D649 Anemia, unspecified: Secondary | ICD-10-CM | POA: Diagnosis not present

## 2019-02-26 DIAGNOSIS — N1831 Chronic kidney disease, stage 3a: Secondary | ICD-10-CM | POA: Diagnosis not present

## 2019-02-26 DIAGNOSIS — I10 Essential (primary) hypertension: Secondary | ICD-10-CM | POA: Diagnosis not present

## 2019-03-03 ENCOUNTER — Other Ambulatory Visit: Payer: Self-pay | Admitting: Internal Medicine

## 2019-03-03 ENCOUNTER — Ambulatory Visit
Admission: RE | Admit: 2019-03-03 | Discharge: 2019-03-03 | Disposition: A | Discharge status: Home / Self Care | Payer: Medicare Other | Source: Ambulatory Visit | Attending: Internal Medicine | Admitting: Internal Medicine

## 2019-03-03 DIAGNOSIS — R159 Full incontinence of feces: Secondary | ICD-10-CM | POA: Diagnosis not present

## 2019-03-03 DIAGNOSIS — R634 Abnormal weight loss: Secondary | ICD-10-CM | POA: Diagnosis not present

## 2019-03-03 DIAGNOSIS — R195 Other fecal abnormalities: Secondary | ICD-10-CM | POA: Diagnosis not present

## 2019-03-03 DIAGNOSIS — R432 Parageusia: Secondary | ICD-10-CM | POA: Diagnosis not present

## 2019-03-03 DIAGNOSIS — R6889 Other general symptoms and signs: Secondary | ICD-10-CM | POA: Diagnosis not present

## 2019-03-03 DIAGNOSIS — K5900 Constipation, unspecified: Secondary | ICD-10-CM | POA: Diagnosis not present

## 2019-03-10 ENCOUNTER — Ambulatory Visit
Admission: RE | Admit: 2019-03-10 | Discharge: 2019-03-10 | Disposition: A | Discharge status: Home / Self Care | Payer: Medicare Other | Source: Ambulatory Visit | Attending: Internal Medicine | Admitting: Internal Medicine

## 2019-03-10 ENCOUNTER — Other Ambulatory Visit: Payer: Self-pay

## 2019-03-10 DIAGNOSIS — Z1231 Encounter for screening mammogram for malignant neoplasm of breast: Secondary | ICD-10-CM | POA: Diagnosis not present

## 2019-03-12 DIAGNOSIS — R634 Abnormal weight loss: Secondary | ICD-10-CM | POA: Diagnosis not present

## 2019-03-12 DIAGNOSIS — K5900 Constipation, unspecified: Secondary | ICD-10-CM | POA: Diagnosis not present

## 2019-03-12 DIAGNOSIS — R432 Parageusia: Secondary | ICD-10-CM | POA: Diagnosis not present

## 2019-03-12 DIAGNOSIS — R159 Full incontinence of feces: Secondary | ICD-10-CM | POA: Diagnosis not present

## 2019-03-12 DIAGNOSIS — R195 Other fecal abnormalities: Secondary | ICD-10-CM | POA: Diagnosis not present

## 2019-03-12 DIAGNOSIS — R6889 Other general symptoms and signs: Secondary | ICD-10-CM | POA: Diagnosis not present

## 2019-03-18 DIAGNOSIS — I129 Hypertensive chronic kidney disease with stage 1 through stage 4 chronic kidney disease, or unspecified chronic kidney disease: Secondary | ICD-10-CM | POA: Diagnosis not present

## 2019-03-18 DIAGNOSIS — M353 Polymyalgia rheumatica: Secondary | ICD-10-CM | POA: Diagnosis not present

## 2019-03-18 DIAGNOSIS — E1129 Type 2 diabetes mellitus with other diabetic kidney complication: Secondary | ICD-10-CM | POA: Diagnosis not present

## 2019-03-18 DIAGNOSIS — N1832 Chronic kidney disease, stage 3b: Secondary | ICD-10-CM | POA: Diagnosis not present

## 2019-03-19 DIAGNOSIS — D649 Anemia, unspecified: Secondary | ICD-10-CM | POA: Diagnosis not present

## 2019-03-19 DIAGNOSIS — I10 Essential (primary) hypertension: Secondary | ICD-10-CM | POA: Diagnosis not present

## 2019-03-19 DIAGNOSIS — E119 Type 2 diabetes mellitus without complications: Secondary | ICD-10-CM | POA: Diagnosis not present

## 2019-03-19 DIAGNOSIS — E7800 Pure hypercholesterolemia, unspecified: Secondary | ICD-10-CM | POA: Diagnosis not present

## 2019-03-19 DIAGNOSIS — E039 Hypothyroidism, unspecified: Secondary | ICD-10-CM | POA: Diagnosis not present

## 2019-03-26 ENCOUNTER — Other Ambulatory Visit: Payer: Self-pay | Admitting: Obstetrics and Gynecology

## 2019-03-26 NOTE — Telephone Encounter (Signed)
Medication refill request: Estradiol and Bupropion Last AEX:  01/20/19 BS Next AEX: 01/21/20 Last MMG (if hormonal medication request): 03/10/19 BIRADS 1 negative/density b Refill authorized: Please advise on refills

## 2019-03-31 DIAGNOSIS — Z20828 Contact with and (suspected) exposure to other viral communicable diseases: Secondary | ICD-10-CM | POA: Diagnosis not present

## 2019-04-03 ENCOUNTER — Other Ambulatory Visit: Payer: Self-pay | Admitting: Obstetrics and Gynecology

## 2019-04-03 NOTE — Telephone Encounter (Signed)
Spoke with [facility]. They have received the patient's prescriptions and delivered them to patient. Closing encounter.

## 2019-04-03 NOTE — Telephone Encounter (Signed)
Caitlin from [facility] is calling to request a refill for Wellbutrin XL 150MG  25 hr tablet and estradiol 0.1MG /GM vaginal cream. Prescriptions refilled on 03/26/2019 and confirmation receipt by the pharmacy was obtained as of 03/26/2019. Caitlin requesting that prescription be resent. Confirmed pharmacy on file as [facility].

## 2019-04-21 ENCOUNTER — Ambulatory Visit (INDEPENDENT_AMBULATORY_CARE_PROVIDER_SITE_OTHER): Payer: Medicare Other | Admitting: Psychology

## 2019-04-21 DIAGNOSIS — F3489 Other specified persistent mood disorders: Secondary | ICD-10-CM

## 2019-04-29 DIAGNOSIS — E039 Hypothyroidism, unspecified: Secondary | ICD-10-CM | POA: Diagnosis not present

## 2019-04-29 DIAGNOSIS — E7800 Pure hypercholesterolemia, unspecified: Secondary | ICD-10-CM | POA: Diagnosis not present

## 2019-04-29 DIAGNOSIS — D649 Anemia, unspecified: Secondary | ICD-10-CM | POA: Diagnosis not present

## 2019-04-29 DIAGNOSIS — I10 Essential (primary) hypertension: Secondary | ICD-10-CM | POA: Diagnosis not present

## 2019-04-29 DIAGNOSIS — E119 Type 2 diabetes mellitus without complications: Secondary | ICD-10-CM | POA: Diagnosis not present

## 2019-05-05 DIAGNOSIS — R634 Abnormal weight loss: Secondary | ICD-10-CM | POA: Diagnosis not present

## 2019-05-05 DIAGNOSIS — Q822 Mastocytosis: Secondary | ICD-10-CM | POA: Diagnosis not present

## 2019-05-05 DIAGNOSIS — E039 Hypothyroidism, unspecified: Secondary | ICD-10-CM | POA: Diagnosis not present

## 2019-05-05 DIAGNOSIS — K5900 Constipation, unspecified: Secondary | ICD-10-CM | POA: Diagnosis not present

## 2019-05-05 DIAGNOSIS — I10 Essential (primary) hypertension: Secondary | ICD-10-CM | POA: Diagnosis not present

## 2019-05-05 DIAGNOSIS — E7800 Pure hypercholesterolemia, unspecified: Secondary | ICD-10-CM | POA: Diagnosis not present

## 2019-05-05 DIAGNOSIS — R195 Other fecal abnormalities: Secondary | ICD-10-CM | POA: Diagnosis not present

## 2019-05-05 DIAGNOSIS — Z0000 Encounter for general adult medical examination without abnormal findings: Secondary | ICD-10-CM | POA: Diagnosis not present

## 2019-05-05 DIAGNOSIS — N183 Chronic kidney disease, stage 3 unspecified: Secondary | ICD-10-CM | POA: Diagnosis not present

## 2019-05-05 DIAGNOSIS — Z23 Encounter for immunization: Secondary | ICD-10-CM | POA: Diagnosis not present

## 2019-05-05 DIAGNOSIS — R159 Full incontinence of feces: Secondary | ICD-10-CM | POA: Diagnosis not present

## 2019-05-05 DIAGNOSIS — M353 Polymyalgia rheumatica: Secondary | ICD-10-CM | POA: Diagnosis not present

## 2019-05-05 DIAGNOSIS — E538 Deficiency of other specified B group vitamins: Secondary | ICD-10-CM | POA: Diagnosis not present

## 2019-05-05 DIAGNOSIS — Z1389 Encounter for screening for other disorder: Secondary | ICD-10-CM | POA: Diagnosis not present

## 2019-05-05 DIAGNOSIS — R6889 Other general symptoms and signs: Secondary | ICD-10-CM | POA: Diagnosis not present

## 2019-05-07 ENCOUNTER — Other Ambulatory Visit: Payer: Self-pay

## 2019-05-07 ENCOUNTER — Ambulatory Visit (INDEPENDENT_AMBULATORY_CARE_PROVIDER_SITE_OTHER): Payer: Medicare Other | Admitting: Obstetrics and Gynecology

## 2019-05-07 ENCOUNTER — Encounter: Payer: Self-pay | Admitting: Obstetrics and Gynecology

## 2019-05-07 VITALS — BP 108/68 | HR 84 | Temp 97.2°F | Resp 14 | Ht 63.25 in | Wt 152.1 lb

## 2019-05-07 DIAGNOSIS — N952 Postmenopausal atrophic vaginitis: Secondary | ICD-10-CM | POA: Diagnosis not present

## 2019-05-07 DIAGNOSIS — R6882 Decreased libido: Secondary | ICD-10-CM | POA: Diagnosis not present

## 2019-05-07 DIAGNOSIS — K6289 Other specified diseases of anus and rectum: Secondary | ICD-10-CM

## 2019-05-07 MED ORDER — BUPROPION HCL ER (XL) 300 MG PO TB24: 300.0000 mg | ORAL_TABLET | Freq: Every day | ORAL | 1 refills | Status: DC

## 2019-05-07 NOTE — Progress Notes (Signed)
GYNECOLOGY  VISIT   HPI: 69 y.o.   Married  Caucasian  female   G2P2 with No LMP recorded (lmp unknown). Patient has had a hysterectomy.   here for 3 month follow up.    On Wellbutrin 150 mg XL. No change in libido.   Not interested in surgery to increase size of introitus. Not being sexually active.  Not using vaginal estrogen cream regularly.    She notes that with wiping she always has stool there.  It does not happen all the time.  No fecal soiling in her underwear. Sometimes difficulty controlling gas. She can have constipation and then loose stools. She has had recommendations for Metamucil, Align, stool softener.  Miralax caused loose bowels.  States she is not having a problem right now.   GYNECOLOGIC HISTORY: No LMP recorded (lmp unknown). Patient has had a hysterectomy. Contraception:  Hyst Menopausal hormone therapy: Estrace cream Last mammogram: 03-10-19 3D/Neg/density B/BiRads1 Last pap smear: 09-07-16 Neg, 09-30-15 Neg,06-23-14 Neg:Neg HR HPV        OB History    Gravida  2   Para  2   Term      Preterm      AB      Living  2     SAB      TAB      Ectopic      Multiple      Live Births                 Patient Active Problem List   Diagnosis Date Noted  . Murmur 04/14/2017  . Postmenopausal HRT (hormone replacement therapy) 10/29/2014  . Hot flashes, menopausal 10/29/2014  . Fitting and adjustment of gastric lap band 01/14/2013  . Obese 01/14/2013  . H/O vitamin D deficiency 08/09/2012  . Unspecified hypothyroidism 08/09/2012  . Menopause 08/09/2012  . Postoperative seroma-in ventral hernia repair site 07/20/2011  . Lapband APS April 2010 07/20/2011  . Hypertension   . Mast cell disorder   . DEGENERATIVE JOINT DISEASE 02/19/2008  . SPONDYLOSIS UNSPEC SITE W/O MENTION MYELOPATHY 02/19/2008  . SINUS TARSI SYNDROME 02/19/2008  . UNEQUAL LEG LENGTH 02/19/2008    Past Medical History:  Diagnosis Date  . Abnormal Pap smear of cervix     Hx of cryotherapy to cervix in her 70s  . Abnormal uterine bleeding    history of fibroids  . Anemia   . Chronic kidney disease    stage 2  . Diabetes mellitus without complication ([place])    type 2 diagnosed 3 years ago  . DJD (degenerative joint disease)   . Fibroid   . Heart murmur    functional  . History of hypertension NO MEDS SINCE LAP GASTRIC BAND 2010 --  WT. LOSS  . Hypertension   . Hypothyroidism   . Low testosterone level in female 2019  . Low testosterone level in female   . 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  STATES SHE CARRIES EPI PEN--BUT HAS NOT HAD ANY RECENT EPISODES.  STATES MANY OF THE MEDICATIONS SHE TAKES ARE TO HELP THIS PROBLEM - INCLUDING  Ketotifen-mast cell inhibitor-not approved in [place].   . Migraine   . 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 Osteopenia   . Other fracture of sacrum, initial encounter for closed fracture ([place]) 06/2017  . PMR (polymyalgia rheumatica) ([facility])   .  PONV (postoperative nausea and vomiting)   . Spondylosis     Past Surgical History:  Procedure Laterality Date  . ABDOMINAL HYSTERECTOMY  02/20/2011   Procedure: HYSTERECTOMY ABDOMINAL;  Surgeon: Bennetta Laos, MD;  Location: [facility];  Service: Gynecology;  Laterality: N/A;  . BREAST BIOPSY    . CERVICAL BIOPSY  W/ LOOP ELECTRODE EXCISION  1992  . CERVIX LESION DESTRUCTION    . [place]   X2  . CHOLECYSTECTOMY    . COLONOSCOPY    . DILATION AND CURETTAGE OF UTERUS     heavy menses  . HYSTEROSCOPY  06/1995   HYSTEROSCOPIC MYOMECTOMY  . HYSTEROSCOPY  06/1998   D&C, HYSTEROSCOPY  . KNEE SURGERY     X2 right knee  . LAPAROSCOPIC GASTRIC BANDING  08-03-2008   [facility] APS SYSTEM  . LESION REMOVAL N/A 09/02/2012   Procedure: EXCISION VAGINAL LESION;  Surgeon: Terrance Mass, MD;  Location: [facility];   Service: Gynecology;  Laterality: N/A;  cpt 57100  one hour  . PUBOVAGINAL SLING  02/20/2011   Procedure: Gaynelle Arabian;  Surgeon: Ailene Rud, MD;  Location: [facility];  Service: Urology;  Laterality: N/A;  . RECTOCELE REPAIR N/A 12/19/2016   Procedure: POSTERIOR REPAIR (RECTOCELE);  Surgeon: Nunzio Cobbs, MD;  Location: [facility];  Service: Gynecology;  Laterality: N/A;  1.25 hours  . SALPINGOOPHORECTOMY  02/20/2011   Procedure: SALPINGO OOPHERECTOMY;  Surgeon: Bennetta Laos, MD;  Location: [facility] [facility];  Service: Gynecology;  Laterality: Bilateral;  . UPPER GASTROINTESTINAL ENDOSCOPY  06/2011  . VENTRAL HERNIA REPAIR  05/24/2011   Procedure: LAPAROSCOPIC VENTRAL HERNIA;  Surgeon: Pedro Earls, MD;  Location: [facility];  Service: General;  Laterality: N/A;  . WOUND EXPLORATION Left 02/22/2018   Procedure: EXPLORATION OF MASS  BEHIND LEFT EAR;  Surgeon: Johnathan Hausen, MD;  Location: [place];  Service: General;  Laterality: Left;    Current Outpatient Medications  Medication Sig Dispense Refill  . buPROPion (WELLBUTRIN XL) 150 MG 24 hr tablet TAKE ONE TABLET BY MOUTH EVERY DAY 90 tablet 0  . cetirizine (ZYRTEC) 10 MG tablet Take 10 mg by mouth daily.     . clonazePAM (KLONOPIN) 2 MG tablet Take 1 tablet by mouth as needed.    Marland Kitchen EPINEPHrine (EPI-PEN) 0.3 mg/0.3 mL DEVI Inject into the muscle as needed.    Marland Kitchen estradiol (ESTRACE) 0.1 MG/GM vaginal cream Use 1/2 g vaginally every night for the first 2 weeks, then use 1/2 g vaginally two or three times per week as needed to maintain symptom relief. 42.5 g 0  . famotidine (PEPCID) 20 MG tablet     . hydrOXYzine (ATARAX/VISTARIL) 50 MG tablet Take 1 tablet by mouth 2 (two) times daily.    . hyoscyamine (LEVSIN SL) 0.125 MG SL tablet Place 1 tablet under the tongue as needed.    Marland Kitchen losartan (COZAAR) 25 MG tablet Take 2 tablets by mouth daily.    . montelukast (SINGULAIR) 10 MG tablet Take 10 mg by mouth at  bedtime.     . ondansetron (ZOFRAN-ODT) 4 MG disintegrating tablet Take 1 tablet by mouth as needed.    Marland Kitchen PRESCRIPTION MEDICATION Take 1 tablet by mouth 2 (two) times daily. Ketotifen 5mg  tablet[place]. For mast cell problem    . Rosuvastatin Calcium 20 MG CPSP     . Semaglutide, 1 MG/DOSE, (OZEMPIC, 1 MG/DOSE,) 2 MG/1.5ML SOPN     .  SYNTHROID 100 MCG tablet     . topiramate (TOPAMAX) 50 MG tablet Take 50 mg by mouth 2 (two) times daily.    Marland Kitchen UNABLE TO FIND 2 capsules 2 (two) times daily. Med Name: AlgaeCal Plus    . VASCEPA 1 g CAPS Take 1 capsule by mouth 2 (two) times daily.    Marland Kitchen zolmitriptan (ZOMIG) 5 MG tablet Take 5 mg by mouth as needed for migraine.     No current facility-administered medications for this visit.     ALLERGIES: Demerol  Family History  Problem Relation Age of Onset  . Breast cancer Mother        34's  . Hypertension Mother   . Thyroid disease Mother        hypothyroid  . Cancer Father        LUNG  . Thyroid disease Maternal Grandmother        hypothyroid    Social History   Socioeconomic History  . Marital status: Married    Spouse name: Not on file  . Number of children: Not on file  . Years of education: Not on file  . Highest education level: Not on file  Occupational History  . Not on file  Tobacco Use  . Smoking status: Never Smoker  . Smokeless tobacco: Never Used  Substance and Sexual Activity  . Alcohol use: No    Alcohol/week: 0.0 standard drinks    Comment: social  . Drug use: No  . Sexual activity: Yes    Partners: Male    Birth control/protection: Surgical    Comment: TAH/BSO  Other Topics Concern  . Not on file  Social History Narrative   Lives with husband   Social Determinants of Health   Financial Resource Strain:   . Difficulty of Paying Living Expenses: Not on file  Food Insecurity:   . Worried About Charity fundraiser in the Last Year: Not on file  . Ran Out of Food in the Last Year: Not  on file  Transportation Needs:   . Lack of Transportation (Medical): Not on file  . Lack of Transportation (Non-Medical): Not on file  Physical Activity:   . Days of Exercise per Week: Not on file  . Minutes of Exercise per Session: Not on file  Stress:   . Feeling of Stress : Not on file  Social Connections:   . Frequency of Communication with Friends and Family: Not on file  . Frequency of Social Gatherings with Friends and Family: Not on file  . Attends Religious Services: Not on file  . Active Member of Clubs or Organizations: Not on file  . Attends Archivist Meetings: Not on file  . Marital Status: Not on file  Intimate Partner Violence:   . Fear of Current or Ex-Partner: Not on file  . Emotionally Abused: Not on file  . Physically Abused: Not on file  . Sexually Abused: Not on file    Review of Systems  All other systems reviewed and are negative.   PHYSICAL EXAMINATION:    BP 108/68 (BP Location: Right Arm, Patient Position: Sitting, Cuff Size: Normal)   Pulse 84   Temp (!) 97.2 F (36.2 C) (Skin)   Resp 14   Ht 5' 3.25" (1.607 m)   Wt 152 lb 1.6 oz (69 kg)   LMP  (LMP Unknown)   BMI 26.73 kg/m     General appearance: alert, cooperative and appears stated age  Pelvic: External genitalia:  no lesions              Urethra:  normal appearing urethra with no masses, tenderness or lesions              Bartholins and Skenes: normal                 Vagina: normal appearing vagina with normal color and discharge, no lesions. Vaginal apex with less rugae.  Introitus slightly tight.  Pederson speculum and 2 digits possible for examination.               Cervix: no lesions                Bimanual Exam:  Uterus:  absent              Adnexa: no mass, fullness, tenderness              Rectal exam: Yes.  .  Confirms.              Anus:  Slightly decreased sphincter tone, no lesions  Chaperone was present for exam.  ASSESSMENT  Decreased libido.  Frequent  stooling with wiping. Slightly decreased rectal sphincter tone.  Status post rectocele repair.  Slightly tight introitus.  We discussed rectocele repair along with menopause and decreased sexual activity as causes for this.   Vaginal atrophy.  PLAN  Will increase Wellbutrin XL to 300 mg.  I discussed potential insomnia an increase in her blood pressure with this.  We will consider return to testosterone therapy if she does not see improvement in this in 3 months. We talked about side effects of testosterone treatment.  She will increase fiber in her diet to help with bowel function.  I offered physical therapy for improved rectal function.  She will start using her vaginal estrogen cream regularly.    An After Visit Summary was printed and given to the patient.  ___25___ minutes face to face time of which over 50% was spent in counseling.

## 2019-05-16 ENCOUNTER — Other Ambulatory Visit: Payer: Medicare Other

## 2019-05-16 DIAGNOSIS — E559 Vitamin D deficiency, unspecified: Secondary | ICD-10-CM | POA: Diagnosis not present

## 2019-05-16 DIAGNOSIS — H43812 Vitreous degeneration, left eye: Secondary | ICD-10-CM | POA: Diagnosis not present

## 2019-05-16 DIAGNOSIS — E039 Hypothyroidism, unspecified: Secondary | ICD-10-CM | POA: Diagnosis not present

## 2019-05-16 DIAGNOSIS — D808 Other immunodeficiencies with predominantly antibody defects: Secondary | ICD-10-CM | POA: Diagnosis not present

## 2019-05-16 DIAGNOSIS — D649 Anemia, unspecified: Secondary | ICD-10-CM | POA: Diagnosis not present

## 2019-05-16 DIAGNOSIS — N1832 Chronic kidney disease, stage 3b: Secondary | ICD-10-CM | POA: Diagnosis not present

## 2019-05-16 DIAGNOSIS — E785 Hyperlipidemia, unspecified: Secondary | ICD-10-CM | POA: Diagnosis not present

## 2019-05-16 DIAGNOSIS — E119 Type 2 diabetes mellitus without complications: Secondary | ICD-10-CM | POA: Diagnosis not present

## 2019-05-16 DIAGNOSIS — Z9884 Bariatric surgery status: Secondary | ICD-10-CM | POA: Diagnosis not present

## 2019-05-18 ENCOUNTER — Ambulatory Visit: Payer: Medicare Other | Attending: Internal Medicine

## 2019-05-18 DIAGNOSIS — Z23 Encounter for immunization: Secondary | ICD-10-CM | POA: Diagnosis not present

## 2019-05-18 NOTE — Progress Notes (Signed)
   Covid-19 Vaccination Clinic  Name:  Gabrielle Lynch    MRN: [id number] DOB: 1950-05-25  05/18/2019  [name] was observed post Covid-19 immunization for 15 MINS without incidence. She was provided with Vaccine Information Sheet and instruction to access the V-Safe system.   [name] was instructed to call 911 with any severe reactions post vaccine: Marland Kitchen Difficulty breathing  . Swelling of your face and throat  . A fast heartbeat  . A bad rash all over your body  . Dizziness and weakness

## 2019-05-26 DIAGNOSIS — M85852 Other specified disorders of bone density and structure, left thigh: Secondary | ICD-10-CM | POA: Diagnosis not present

## 2019-05-26 DIAGNOSIS — M85851 Other specified disorders of bone density and structure, right thigh: Secondary | ICD-10-CM | POA: Diagnosis not present

## 2019-05-27 DIAGNOSIS — E039 Hypothyroidism, unspecified: Secondary | ICD-10-CM | POA: Diagnosis not present

## 2019-05-27 DIAGNOSIS — D649 Anemia, unspecified: Secondary | ICD-10-CM | POA: Diagnosis not present

## 2019-05-27 DIAGNOSIS — E7800 Pure hypercholesterolemia, unspecified: Secondary | ICD-10-CM | POA: Diagnosis not present

## 2019-05-27 DIAGNOSIS — I10 Essential (primary) hypertension: Secondary | ICD-10-CM | POA: Diagnosis not present

## 2019-05-27 DIAGNOSIS — E119 Type 2 diabetes mellitus without complications: Secondary | ICD-10-CM | POA: Diagnosis not present

## 2019-06-02 DIAGNOSIS — R5383 Other fatigue: Secondary | ICD-10-CM | POA: Diagnosis not present

## 2019-06-02 DIAGNOSIS — M316 Other giant cell arteritis: Secondary | ICD-10-CM | POA: Diagnosis not present

## 2019-06-02 DIAGNOSIS — M2550 Pain in unspecified joint: Secondary | ICD-10-CM | POA: Diagnosis not present

## 2019-06-02 DIAGNOSIS — M353 Polymyalgia rheumatica: Secondary | ICD-10-CM | POA: Diagnosis not present

## 2019-06-02 DIAGNOSIS — Z6825 Body mass index (BMI) 25.0-25.9, adult: Secondary | ICD-10-CM | POA: Diagnosis not present

## 2019-06-02 DIAGNOSIS — E663 Overweight: Secondary | ICD-10-CM | POA: Diagnosis not present

## 2019-06-05 ENCOUNTER — Ambulatory Visit: Payer: Medicare Other | Attending: Internal Medicine

## 2019-06-05 DIAGNOSIS — Z23 Encounter for immunization: Secondary | ICD-10-CM | POA: Insufficient documentation

## 2019-06-05 NOTE — Progress Notes (Signed)
   Covid-19 Vaccination Clinic  Name:  Gabrielle Lynch    MRN: [id number] DOB: 19-Nov-1950  06/05/2019  Gabrielle Lynch was observed post Covid-19 immunization for 15 minutes without incidence. She was provided with Vaccine Information Sheet and instruction to access the V-Safe system.   Gabrielle Lynch was instructed to call 911 with any severe reactions post vaccine: Marland Kitchen Difficulty breathing  . Swelling of your face and throat  . A fast heartbeat  . A bad rash all over your body  . Dizziness and weakness    Immunizations Administered    Name Date Dose VIS Date Route   Pfizer COVID-19 Vaccine 06/05/2019 12:40 PM 0.3 mL 04/11/2019 Intramuscular   Manufacturer: [employer]   Lot: [id number]   [place]: [id number]

## 2019-06-18 DIAGNOSIS — H43813 Vitreous degeneration, bilateral: Secondary | ICD-10-CM | POA: Diagnosis not present

## 2019-06-18 DIAGNOSIS — H52203 Unspecified astigmatism, bilateral: Secondary | ICD-10-CM | POA: Diagnosis not present

## 2019-06-18 DIAGNOSIS — H40053 Ocular hypertension, bilateral: Secondary | ICD-10-CM | POA: Diagnosis not present

## 2019-06-18 DIAGNOSIS — E559 Vitamin D deficiency, unspecified: Secondary | ICD-10-CM | POA: Diagnosis not present

## 2019-06-18 DIAGNOSIS — R5383 Other fatigue: Secondary | ICD-10-CM | POA: Diagnosis not present

## 2019-06-18 DIAGNOSIS — H25813 Combined forms of age-related cataract, bilateral: Secondary | ICD-10-CM | POA: Diagnosis not present

## 2019-06-18 DIAGNOSIS — M810 Age-related osteoporosis without current pathological fracture: Secondary | ICD-10-CM | POA: Diagnosis not present

## 2019-06-23 DIAGNOSIS — E538 Deficiency of other specified B group vitamins: Secondary | ICD-10-CM | POA: Diagnosis not present

## 2019-06-23 DIAGNOSIS — D649 Anemia, unspecified: Secondary | ICD-10-CM | POA: Diagnosis not present

## 2019-06-28 DIAGNOSIS — D649 Anemia, unspecified: Secondary | ICD-10-CM | POA: Diagnosis not present

## 2019-06-28 DIAGNOSIS — E039 Hypothyroidism, unspecified: Secondary | ICD-10-CM | POA: Diagnosis not present

## 2019-06-28 DIAGNOSIS — I10 Essential (primary) hypertension: Secondary | ICD-10-CM | POA: Diagnosis not present

## 2019-06-28 DIAGNOSIS — E119 Type 2 diabetes mellitus without complications: Secondary | ICD-10-CM | POA: Diagnosis not present

## 2019-06-28 DIAGNOSIS — E7800 Pure hypercholesterolemia, unspecified: Secondary | ICD-10-CM | POA: Diagnosis not present

## 2019-07-01 ENCOUNTER — Other Ambulatory Visit: Payer: Self-pay | Admitting: Obstetrics and Gynecology

## 2019-07-01 NOTE — Telephone Encounter (Signed)
Medication refill request: estrace cream  Last AEX:  01-20-2019 BS Next AEX: 01-21-20 Last MMG (if hormonal medication request): 03-10-2019 density B/BIRADS 1 negative Refill authorized: Today, please advise.   Medication pended for #42.5g, 0RF. Please refill if appropriate.

## 2019-07-07 DIAGNOSIS — H61002 Unspecified perichondritis of left external ear: Secondary | ICD-10-CM | POA: Diagnosis not present

## 2019-07-07 DIAGNOSIS — L570 Actinic keratosis: Secondary | ICD-10-CM | POA: Diagnosis not present

## 2019-07-07 DIAGNOSIS — L658 Other specified nonscarring hair loss: Secondary | ICD-10-CM | POA: Diagnosis not present

## 2019-07-09 DIAGNOSIS — H11823 Conjunctivochalasis, bilateral: Secondary | ICD-10-CM | POA: Diagnosis not present

## 2019-07-09 DIAGNOSIS — H25813 Combined forms of age-related cataract, bilateral: Secondary | ICD-10-CM | POA: Diagnosis not present

## 2019-07-09 DIAGNOSIS — M353 Polymyalgia rheumatica: Secondary | ICD-10-CM | POA: Diagnosis not present

## 2019-07-09 DIAGNOSIS — H02835 Dermatochalasis of left lower eyelid: Secondary | ICD-10-CM | POA: Diagnosis not present

## 2019-07-09 DIAGNOSIS — H43393 Other vitreous opacities, bilateral: Secondary | ICD-10-CM | POA: Diagnosis not present

## 2019-07-09 DIAGNOSIS — H02834 Dermatochalasis of left upper eyelid: Secondary | ICD-10-CM | POA: Diagnosis not present

## 2019-07-09 DIAGNOSIS — E119 Type 2 diabetes mellitus without complications: Secondary | ICD-10-CM | POA: Diagnosis not present

## 2019-07-09 DIAGNOSIS — H02832 Dermatochalasis of right lower eyelid: Secondary | ICD-10-CM | POA: Diagnosis not present

## 2019-07-09 DIAGNOSIS — H43812 Vitreous degeneration, left eye: Secondary | ICD-10-CM | POA: Diagnosis not present

## 2019-07-09 DIAGNOSIS — H02831 Dermatochalasis of right upper eyelid: Secondary | ICD-10-CM | POA: Diagnosis not present

## 2019-07-09 DIAGNOSIS — H524 Presbyopia: Secondary | ICD-10-CM | POA: Diagnosis not present

## 2019-07-09 DIAGNOSIS — H5210 Myopia, unspecified eye: Secondary | ICD-10-CM | POA: Diagnosis not present

## 2019-07-09 DIAGNOSIS — H52209 Unspecified astigmatism, unspecified eye: Secondary | ICD-10-CM | POA: Diagnosis not present

## 2019-07-22 DIAGNOSIS — I10 Essential (primary) hypertension: Secondary | ICD-10-CM | POA: Diagnosis not present

## 2019-07-22 DIAGNOSIS — E119 Type 2 diabetes mellitus without complications: Secondary | ICD-10-CM | POA: Diagnosis not present

## 2019-07-22 DIAGNOSIS — D649 Anemia, unspecified: Secondary | ICD-10-CM | POA: Diagnosis not present

## 2019-07-22 DIAGNOSIS — E039 Hypothyroidism, unspecified: Secondary | ICD-10-CM | POA: Diagnosis not present

## 2019-07-22 DIAGNOSIS — N183 Chronic kidney disease, stage 3 unspecified: Secondary | ICD-10-CM | POA: Diagnosis not present

## 2019-07-22 DIAGNOSIS — E7800 Pure hypercholesterolemia, unspecified: Secondary | ICD-10-CM | POA: Diagnosis not present

## 2019-07-25 DIAGNOSIS — H61002 Unspecified perichondritis of left external ear: Secondary | ICD-10-CM | POA: Diagnosis not present

## 2019-07-25 DIAGNOSIS — D485 Neoplasm of uncertain behavior of skin: Secondary | ICD-10-CM | POA: Diagnosis not present

## 2019-08-05 NOTE — Progress Notes (Deleted)
69 y.o. G2P2 Married Caucasian female here for annual exam.    PCP:     No LMP recorded (lmp unknown). Patient has had a hysterectomy.           Sexually active: {yes no:314532}  The current method of family planning is status post hysterectomy.    Exercising: {yes no:314532}  {types:19826} Smoker:  no  Health Maintenance: Pap:  09-07-16 Neg, 09-30-15 Neg, 06-23-14 Neg:Neg HR HPV History of abnormal Pap: yes, Hx of cryotherapy in her 108's MMG: 03-10-19 3D/Neg/density B/Birads1 Colonoscopy:  *** BMD: 2019  Result :Osteopenia TDaP:  PCP Gardasil:   no HIV: 02-20-11 Neg  Hep C: 02-20-11 Neg Screening Labs:  Hb today: ***, Urine today: ***   reports that she has never smoked. She has never used smokeless tobacco. She reports that she does not drink alcohol or use drugs.  Past Medical History:  Diagnosis Date  . Abnormal Pap smear of cervix    Hx of cryotherapy to cervix in her 49s  . Abnormal uterine bleeding    history of fibroids  . Anemia   . Chronic kidney disease    stage 2  . Diabetes mellitus without complication ([place])    type 2 diagnosed 3 years ago  . DJD (degenerative joint disease)   . Fibroid   . Heart murmur    functional  . History of hypertension NO MEDS SINCE LAP GASTRIC BAND 2010 --  WT. LOSS  . Hypertension   . Hypothyroidism   . Low testosterone level in female 2019  . Low testosterone level in female   . 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  STATES SHE CARRIES EPI PEN--BUT HAS NOT HAD ANY RECENT EPISODES.  STATES MANY OF THE MEDICATIONS SHE TAKES ARE TO HELP THIS PROBLEM - INCLUDING  Ketotifen-mast cell inhibitor-not approved in [place].   . Migraine   . 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 Osteopenia   . Other fracture of sacrum, initial encounter for closed fracture ([place]) 06/2017  . PMR (polymyalgia rheumatica) ([facility])   . PONV  (postoperative nausea and vomiting)   . Spondylosis     Past Surgical History:  Procedure Laterality Date  . ABDOMINAL HYSTERECTOMY  02/20/2011   Procedure: HYSTERECTOMY ABDOMINAL;  Surgeon: Bennetta Laos, MD;  Location: [facility];  Service: Gynecology;  Laterality: N/A;  . BREAST BIOPSY    . CERVICAL BIOPSY  W/ LOOP ELECTRODE EXCISION  1992  . CERVIX LESION DESTRUCTION    . [place]   X2  . CHOLECYSTECTOMY    . COLONOSCOPY    . DILATION AND CURETTAGE OF UTERUS     heavy menses  . HYSTEROSCOPY  06/1995   HYSTEROSCOPIC MYOMECTOMY  . HYSTEROSCOPY  06/1998   D&C, HYSTEROSCOPY  . KNEE SURGERY     X2 right knee  . LAPAROSCOPIC GASTRIC BANDING  08-03-2008   [facility] APS SYSTEM  . LESION REMOVAL N/A 09/02/2012   Procedure: EXCISION VAGINAL LESION;  Surgeon: Terrance Mass, MD;  Location[facility];  Service: Gynecology;  Laterality: N/A;  cpt 57100  one hour  . PUBOVAGINAL SLING  02/20/2011   Procedure: Gaynelle Arabian;  Surgeon: Ailene Rud, MD;  Location: [place] [facility];  Service: Urology;  Laterality: N/A;  . RECTOCELE REPAIR N/A 12/19/2016   Procedure: POSTERIOR REPAIR (RECTOCELE);  Surgeon:  Nunzio Cobbs, MD;  Location: [place] [facility];  Service: Gynecology;  Laterality: N/A;  1.25 hours  . SALPINGOOPHORECTOMY  02/20/2011   Procedure: SALPINGO OOPHERECTOMY;  Surgeon: Bennetta Laos, MD;  Location: [place] [facility];  Service: Gynecology;  Laterality: Bilateral;  . UPPER GASTROINTESTINAL ENDOSCOPY  06/2011  . VENTRAL HERNIA REPAIR  05/24/2011   Procedure: LAPAROSCOPIC VENTRAL HERNIA;  Surgeon: Pedro Earls, MD;  Location[facility];  Service: General;  Laterality: N/A;  . WOUND EXPLORATION Left 02/22/2018   Procedure: EXPLORATION OF MASS  BEHIND LEFT EAR;  Surgeon: Johnathan Hausen, MD;  Location: [place];  Service: General;  Laterality: Left;    Current Outpatient Medications  Medication Sig Dispense Refill  . buPROPion  (WELLBUTRIN XL) 300 MG 24 hr tablet Take 1 tablet (300 mg total) by mouth daily. 90 tablet 1  . cetirizine (ZYRTEC) 10 MG tablet Take 10 mg by mouth daily.     . clonazePAM (KLONOPIN) 2 MG tablet Take 1 tablet by mouth as needed.    Marland Kitchen EPINEPHrine (EPI-PEN) 0.3 mg/0.3 mL DEVI Inject into the muscle as needed.    Marland Kitchen estradiol (ESTRACE) 0.1 MG/GM vaginal cream Use 1/2 g vaginally every night for the first 2 weeks, then use 1/2 g vaginally two or three times per week as needed to maintain symptom relief. 42.5 g 1  . famotidine (PEPCID) 20 MG tablet     . hydrOXYzine (ATARAX/VISTARIL) 50 MG tablet Take 1 tablet by mouth 2 (two) times daily.    . hyoscyamine (LEVSIN SL) 0.125 MG SL tablet Place 1 tablet under the tongue as needed.    Marland Kitchen losartan (COZAAR) 25 MG tablet Take 2 tablets by mouth daily.    . montelukast (SINGULAIR) 10 MG tablet Take 10 mg by mouth at bedtime.     . ondansetron (ZOFRAN-ODT) 4 MG disintegrating tablet Take 1 tablet by mouth as needed.    Marland Kitchen PRESCRIPTION MEDICATION Take 1 tablet by mouth 2 (two) times daily. Ketotifen 5mg  tablet[place] For mast cell problem    . Rosuvastatin Calcium 20 MG CPSP     . Semaglutide, 1 MG/DOSE, (OZEMPIC, 1 MG/DOSE,) 2 MG/1.5ML SOPN     . SYNTHROID 100 MCG tablet     . topiramate (TOPAMAX) 50 MG tablet Take 50 mg by mouth 2 (two) times daily.    Marland Kitchen UNABLE TO FIND 2 capsules 2 (two) times daily. Med Name: AlgaeCal Plus    . VASCEPA 1 g CAPS Take 1 capsule by mouth 2 (two) times daily.    Marland Kitchen zolmitriptan (ZOMIG) 5 MG tablet Take 5 mg by mouth as needed for migraine.     No current facility-administered medications for this visit.    Family History  Problem Relation Age of Onset  . Breast cancer Mother        90's  . Hypertension Mother   . Thyroid disease Mother        hypothyroid  . Cancer Father        LUNG  . Thyroid disease Maternal Grandmother        hypothyroid    Review of Systems  Exam:   LMP  (LMP  Unknown)     General appearance: alert, cooperative and appears stated age Head: normocephalic, without obvious abnormality, atraumatic Neck: no adenopathy, supple, symmetrical, trachea midline and thyroid normal to inspection and palpation Lungs: clear to auscultation bilaterally Breasts: normal appearance, no masses or tenderness, No nipple retraction  or dimpling, No nipple discharge or bleeding, No axillary adenopathy Heart: regular rate and rhythm Abdomen: soft, non-tender; no masses, no organomegaly Extremities: extremities normal, atraumatic, no cyanosis or edema Skin: skin color, texture, turgor normal. No rashes or lesions Lymph nodes: cervical, supraclavicular, and axillary nodes normal. Neurologic: grossly normal  Pelvic: External genitalia:  no lesions              No abnormal inguinal nodes palpated.              Urethra:  normal appearing urethra with no masses, tenderness or lesions              Bartholins and Skenes: normal                 Vagina: normal appearing vagina with normal color and discharge, no lesions              Cervix: no lesions              Pap taken: {yes no:314532} Bimanual Exam:  Uterus:  normal size, contour, position, consistency, mobility, non-tender              Adnexa: no mass, fullness, tenderness              Rectal exam: {yes no:314532}.  Confirms.              Anus:  normal sphincter tone, no lesions  Chaperone was present for exam.  Assessment:   Well woman visit with normal exam.   Plan: Mammogram screening discussed. Self breast awareness reviewed. Pap and HR HPV as above. Guidelines for Calcium, Vitamin D, regular exercise program including cardiovascular and weight bearing exercise.   Follow up annually and prn.   Additional counseling given.  {yes [id number]. _______ minutes face to face time of which over 50% was spent in counseling.    After visit summary provided.

## 2019-08-06 ENCOUNTER — Ambulatory Visit: Payer: Medicare Other | Admitting: Obstetrics and Gynecology

## 2019-08-11 DIAGNOSIS — M19072 Primary osteoarthritis, left ankle and foot: Secondary | ICD-10-CM | POA: Diagnosis not present

## 2019-08-11 DIAGNOSIS — M19071 Primary osteoarthritis, right ankle and foot: Secondary | ICD-10-CM | POA: Diagnosis not present

## 2019-08-14 DIAGNOSIS — H04123 Dry eye syndrome of bilateral lacrimal glands: Secondary | ICD-10-CM | POA: Diagnosis not present

## 2019-08-14 DIAGNOSIS — H43813 Vitreous degeneration, bilateral: Secondary | ICD-10-CM | POA: Diagnosis not present

## 2019-08-14 DIAGNOSIS — H25813 Combined forms of age-related cataract, bilateral: Secondary | ICD-10-CM | POA: Diagnosis not present

## 2019-08-22 ENCOUNTER — Other Ambulatory Visit: Payer: Self-pay

## 2019-08-22 DIAGNOSIS — N183 Chronic kidney disease, stage 3 unspecified: Secondary | ICD-10-CM | POA: Diagnosis not present

## 2019-08-22 DIAGNOSIS — I10 Essential (primary) hypertension: Secondary | ICD-10-CM | POA: Diagnosis not present

## 2019-08-22 DIAGNOSIS — D649 Anemia, unspecified: Secondary | ICD-10-CM | POA: Diagnosis not present

## 2019-08-22 DIAGNOSIS — E039 Hypothyroidism, unspecified: Secondary | ICD-10-CM | POA: Diagnosis not present

## 2019-08-22 DIAGNOSIS — E7800 Pure hypercholesterolemia, unspecified: Secondary | ICD-10-CM | POA: Diagnosis not present

## 2019-08-22 DIAGNOSIS — E119 Type 2 diabetes mellitus without complications: Secondary | ICD-10-CM | POA: Diagnosis not present

## 2019-08-25 ENCOUNTER — Other Ambulatory Visit: Payer: Self-pay

## 2019-08-25 ENCOUNTER — Ambulatory Visit (INDEPENDENT_AMBULATORY_CARE_PROVIDER_SITE_OTHER): Payer: Medicare Other | Admitting: Obstetrics and Gynecology

## 2019-08-25 ENCOUNTER — Encounter: Payer: Self-pay | Admitting: Obstetrics and Gynecology

## 2019-08-25 VITALS — BP 110/68 | HR 68 | Resp 10 | Ht 63.5 in | Wt 157.0 lb

## 2019-08-25 DIAGNOSIS — Z124 Encounter for screening for malignant neoplasm of cervix: Secondary | ICD-10-CM | POA: Diagnosis not present

## 2019-08-25 DIAGNOSIS — Z01419 Encounter for gynecological examination (general) (routine) without abnormal findings: Secondary | ICD-10-CM | POA: Diagnosis not present

## 2019-08-25 NOTE — Patient Instructions (Signed)

## 2019-08-25 NOTE — Progress Notes (Signed)
69 y.o. G2P2 Married Caucasian female here for annual exam and 3 month follow up.    Has received her Covid vaccine.   Not using vaginal estrogen cream.  Does not need refill.  Stopped Wellbutrin as it was not working to increase libido.  Not interested in testosterone therapy.   Bladder control is good.  Has some fecal soiling.  Miralax gave her diarrhea.  Metamucil not helplful.  PCP:  Josetta Huddle, MD   No LMP recorded (lmp unknown). Patient has had a hysterectomy.           Sexually active: No.  The current method of family planning is status post hysterectomy.    Exercising: No.  The patient does not participate in regular exercise at present. Smoker:  no  Health Maintenance: Pap: 09-07-16 Neg, 09-30-15 Neg,06-23-14 Neg:Neg HR HPV History of abnormal Pap:  Yes, Hx of cryotherapy in her 40's MMG: 03-10-19 3D/Neg/density B/BiRads1 Colonoscopy:  2-3 years ago per patient - normal done with Dr. Earlean Shawl.  Due every 5 days. BMD: 2021  Result done with [facility].  Dr. Ahmed Prima - Ortho.  TDaP: PCP Gardasil:   no HIV: Neg 01/2011 Hep C:01/2011 Neg Screening Labs:  PCP   reports that she has never smoked. She has never used smokeless tobacco. She reports that she does not drink alcohol or use drugs.  Past Medical History:  Diagnosis Date  . Abnormal Pap smear of cervix    Hx of cryotherapy to cervix in her 43s  . Abnormal uterine bleeding    history of fibroids  . Anemia   . Chronic kidney disease    stage 2  . Diabetes mellitus without complication ([place])    type 2 diagnosed 3 years ago  . DJD (degenerative joint disease)   . Fibroid   . Heart murmur    functional  . History of hypertension NO MEDS SINCE LAP GASTRIC BAND 2010 --  WT. LOSS  . Hypertension   . Hypothyroidism   . Low testosterone level in female 2019  . Low testosterone level in female   . 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  STATES SHE CARRIES EPI PEN--BUT HAS NOT HAD ANY RECENT EPISODES.  STATES MANY OF THE MEDICATIONS SHE TAKES ARE TO HELP THIS PROBLEM - INCLUDING  Ketotifen-mast cell inhibitor-not approved in [place].   . Migraine   . 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 Osteopenia   . Other fracture of sacrum, initial encounter for closed fracture ([place]) 06/2017  . PMR (polymyalgia rheumatica) (HCC)   . PONV (postoperative nausea and vomiting)   . Spondylosis     Past Surgical History:  Procedure Laterality Date  . ABDOMINAL HYSTERECTOMY  02/20/2011   Procedure: HYSTERECTOMY ABDOMINAL;  Surgeon: Bennetta Laos, MD;  Location: [facility];  Service: Gynecology;  Laterality: N/A;  . BREAST BIOPSY    . CERVICAL BIOPSY  W/ LOOP ELECTRODE EXCISION  1992  . CERVIX LESION DESTRUCTION    . [place]   X2  . CHOLECYSTECTOMY    . COLONOSCOPY    . DILATION AND CURETTAGE OF UTERUS     heavy menses  . HYSTEROSCOPY  06/1995   HYSTEROSCOPIC MYOMECTOMY  . HYSTEROSCOPY  06/1998   D&C, HYSTEROSCOPY  . KNEE SURGERY     X2 right knee  . LAPAROSCOPIC GASTRIC BANDING  08-03-2008   [place]  . LESION REMOVAL N/A 09/02/2012   Procedure: EXCISION VAGINAL LESION;  Surgeon: Terrance Mass, MD;  Location: [facility];  Service: Gynecology;  Laterality: N/A;  cpt 57100  one hour  . PUBOVAGINAL SLING  02/20/2011   Procedure: Gaynelle Arabian;  Surgeon: Ailene Rud, MD;  Location: [facility];  Service: Urology;  Laterality: N/A;  . RECTOCELE REPAIR N/A 12/19/2016   Procedure: POSTERIOR REPAIR (RECTOCELE);  Surgeon: Nunzio Cobbs, MD;  Location: [facility];  Service: Gynecology;  Laterality: N/A;  1.25 hours  . SALPINGOOPHORECTOMY  02/20/2011   Procedure: SALPINGO OOPHERECTOMY;  Surgeon: Bennetta Laos, MD;  Location: [facility];  Service: Gynecology;  Laterality: Bilateral;  . UPPER GASTROINTESTINAL ENDOSCOPY  06/2011  .  VENTRAL HERNIA REPAIR  05/24/2011   Procedure: LAPAROSCOPIC VENTRAL HERNIA;  Surgeon: Pedro Earls, MD;  Location: [facility];  Service: General;  Laterality: N/A;  . WOUND EXPLORATION Left 02/22/2018   Procedure: EXPLORATION OF MASS  BEHIND LEFT EAR;  Surgeon: Johnathan Hausen, MD;  Location: [facility];  Service: General;  Laterality: Left;    Current Outpatient Medications  Medication Sig Dispense Refill  . cetirizine (ZYRTEC) 10 MG tablet Take 10 mg by mouth daily.     . clonazePAM (KLONOPIN) 2 MG tablet Take 1 tablet by mouth as needed.    Marland Kitchen denosumab (PROLIA) 60 MG/ML SOSY injection Inject 60 mg into the skin every 6 (six) months.    . EPINEPHrine (EPI-PEN) 0.3 mg/0.3 mL DEVI Inject into the muscle as needed.    Marland Kitchen estradiol (ESTRACE) 0.1 MG/GM vaginal cream Use 1/2 g vaginally every night for the first 2 weeks, then use 1/2 g vaginally two or three times per week as needed to maintain symptom relief. 42.5 g 1  . famotidine (PEPCID) 20 MG tablet     . finasteride (PROSCAR) 5 MG tablet Take by mouth.    . hydrOXYzine (ATARAX/VISTARIL) 50 MG tablet Take 1 tablet by mouth 2 (two) times daily.    . hyoscyamine (LEVSIN SL) 0.125 MG SL tablet Place 1 tablet under the tongue as needed.    Marland Kitchen losartan (COZAAR) 25 MG tablet Take 2 tablets by mouth daily.    . meloxicam (MOBIC) 15 MG tablet     . montelukast (SINGULAIR) 10 MG tablet Take 10 mg by mouth at bedtime.     . ondansetron (ZOFRAN-ODT) 4 MG disintegrating tablet Take 1 tablet by mouth as needed.    Marland Kitchen PRESCRIPTION MEDICATION Take 1 tablet by mouth 2 (two) times daily. Ketotifen 5mg  tablet[place]. For mast cell problem    . rizatriptan (MAXALT) 10 MG tablet     . Rosuvastatin Calcium 20 MG CPSP     . SYNTHROID 100 MCG tablet     . topiramate (TOPAMAX) 50 MG tablet Take 50 mg by mouth 2 (two) times daily.    Marland Kitchen UNABLE TO FIND 2 capsules 2 (two) times daily. Med Name: AlgaeCal Plus    . VASCEPA 1 g CAPS  Take 1 capsule by mouth 2 (two) times daily.    Marland Kitchen zolmitriptan (ZOMIG) 5 MG tablet Take 5 mg by mouth as needed for migraine.     No current facility-administered medications for this visit.    Family History  Problem Relation Age of Onset  . Breast cancer Mother        14's  . Hypertension Mother   .  Thyroid disease Mother        hypothyroid  . Cancer Father        LUNG  . Thyroid disease Maternal Grandmother        hypothyroid    Review of Systems  Constitutional: Negative.   HENT: Negative.   Eyes: Negative.   Respiratory: Negative.   Cardiovascular: Negative.   Gastrointestinal: Negative.   Endocrine: Negative.   Genitourinary: Negative.   Musculoskeletal: Negative.   Skin: Negative.   Allergic/Immunologic: Negative.   Neurological: Negative.   Hematological: Negative.   Psychiatric/Behavioral: Negative.     Exam:   BP 110/68 (BP Location: Right Arm, Patient Position: Sitting, Cuff Size: Normal)   Pulse 68   Resp 10   Ht 5' 3.5" (1.613 m)   Wt 157 lb (71.2 kg)   LMP  (LMP Unknown)   BMI 27.38 kg/m     General appearance: alert, cooperative and appears stated age Head: normocephalic, without obvious abnormality, atraumatic Neck: no adenopathy, supple, symmetrical, trachea midline and thyroid normal to inspection and palpation Lungs: clear to auscultation bilaterally Breasts: normal appearance, no masses or tenderness, No nipple retraction or dimpling, No nipple discharge or bleeding, No axillary adenopathy Heart: regular rate and rhythm Abdomen: soft, non-tender; port palpable right abdomen, no organomegaly Extremities: extremities normal, atraumatic, no cyanosis or edema Skin: skin color, texture, turgor normal. No rashes or lesions Lymph nodes: cervical, supraclavicular, and axillary nodes normal. Neurologic: grossly normal  Pelvic: External genitalia:  no lesions              No abnormal inguinal nodes palpated.              Urethra:  normal appearing  urethra with no masses, tenderness or lesions              Bartholins and Skenes: normal                 Vagina: normal appearing vagina with normal color and discharge, no lesions              Cervix: absent              Pap taken: No. Bimanual Exam:  Uterus:  Absent.              Adnexa: no mass, fullness, tenderness              Rectal exam: Yes.  .  Confirms.              Anus:  normal sphincter tone, no lesions  Chaperone was present for exam.  Assessment:   Well woman visit with GYN exam. Status post total abdominal hysterectomy for fibroids in 2012.  Final cervical pathology benign.   Status post BSO.  Status post midurethral sling. Status post rectocele repair. Fecal soiling.  Tight introitus.  Bilateral sacral fracture 06/2017.  Hx of abnormal paps and cryo, LEEP, and conization of cervix. Decreased libido.  Diabetes. On Prolia.   Plan: Mammogram screening discussed. Self breast awareness reviewed. Pap and HR HPV as above. Guidelines for Calcium, Vitamin D, regular exercise program including cardiovascular and weight bearing exercise. She will let me know if she needs vaginal estrogen cream. I mentioned Dr. Lenise Arena, colon and rectal surgeon, as a potential referral for fecal incontinence.  Follow up annually and prn.   After visit summary provided.

## 2019-09-17 DIAGNOSIS — D808 Other immunodeficiencies with predominantly antibody defects: Secondary | ICD-10-CM | POA: Diagnosis not present

## 2019-09-17 DIAGNOSIS — H43812 Vitreous degeneration, left eye: Secondary | ICD-10-CM | POA: Diagnosis not present

## 2019-09-17 DIAGNOSIS — Z9884 Bariatric surgery status: Secondary | ICD-10-CM | POA: Diagnosis not present

## 2019-09-17 DIAGNOSIS — E559 Vitamin D deficiency, unspecified: Secondary | ICD-10-CM | POA: Diagnosis not present

## 2019-09-17 DIAGNOSIS — N1832 Chronic kidney disease, stage 3b: Secondary | ICD-10-CM | POA: Diagnosis not present

## 2019-09-17 DIAGNOSIS — I712 Thoracic aortic aneurysm, without rupture: Secondary | ICD-10-CM | POA: Diagnosis not present

## 2019-09-17 DIAGNOSIS — M353 Polymyalgia rheumatica: Secondary | ICD-10-CM | POA: Diagnosis not present

## 2019-09-17 DIAGNOSIS — H269 Unspecified cataract: Secondary | ICD-10-CM | POA: Diagnosis not present

## 2019-09-17 DIAGNOSIS — E038 Other specified hypothyroidism: Secondary | ICD-10-CM | POA: Diagnosis not present

## 2019-09-17 DIAGNOSIS — E1129 Type 2 diabetes mellitus with other diabetic kidney complication: Secondary | ICD-10-CM | POA: Diagnosis not present

## 2019-09-17 DIAGNOSIS — E7849 Other hyperlipidemia: Secondary | ICD-10-CM | POA: Diagnosis not present

## 2019-09-19 DIAGNOSIS — I10 Essential (primary) hypertension: Secondary | ICD-10-CM | POA: Diagnosis not present

## 2019-09-19 DIAGNOSIS — D649 Anemia, unspecified: Secondary | ICD-10-CM | POA: Diagnosis not present

## 2019-09-19 DIAGNOSIS — E039 Hypothyroidism, unspecified: Secondary | ICD-10-CM | POA: Diagnosis not present

## 2019-09-19 DIAGNOSIS — E119 Type 2 diabetes mellitus without complications: Secondary | ICD-10-CM | POA: Diagnosis not present

## 2019-09-19 DIAGNOSIS — E7800 Pure hypercholesterolemia, unspecified: Secondary | ICD-10-CM | POA: Diagnosis not present

## 2019-09-19 DIAGNOSIS — N183 Chronic kidney disease, stage 3 unspecified: Secondary | ICD-10-CM | POA: Diagnosis not present

## 2019-10-02 DIAGNOSIS — M353 Polymyalgia rheumatica: Secondary | ICD-10-CM | POA: Diagnosis not present

## 2019-10-02 DIAGNOSIS — Z7952 Long term (current) use of systemic steroids: Secondary | ICD-10-CM | POA: Diagnosis not present

## 2019-10-02 DIAGNOSIS — E538 Deficiency of other specified B group vitamins: Secondary | ICD-10-CM | POA: Diagnosis not present

## 2019-10-02 DIAGNOSIS — Z6825 Body mass index (BMI) 25.0-25.9, adult: Secondary | ICD-10-CM | POA: Diagnosis not present

## 2019-10-02 DIAGNOSIS — E663 Overweight: Secondary | ICD-10-CM | POA: Diagnosis not present

## 2019-10-02 DIAGNOSIS — M2550 Pain in unspecified joint: Secondary | ICD-10-CM | POA: Diagnosis not present

## 2019-10-02 DIAGNOSIS — M316 Other giant cell arteritis: Secondary | ICD-10-CM | POA: Diagnosis not present

## 2019-11-18 DIAGNOSIS — H2512 Age-related nuclear cataract, left eye: Secondary | ICD-10-CM | POA: Diagnosis not present

## 2019-11-21 DIAGNOSIS — E039 Hypothyroidism, unspecified: Secondary | ICD-10-CM | POA: Diagnosis not present

## 2019-11-21 DIAGNOSIS — E119 Type 2 diabetes mellitus without complications: Secondary | ICD-10-CM | POA: Diagnosis not present

## 2019-11-21 DIAGNOSIS — D649 Anemia, unspecified: Secondary | ICD-10-CM | POA: Diagnosis not present

## 2019-11-21 DIAGNOSIS — N183 Chronic kidney disease, stage 3 unspecified: Secondary | ICD-10-CM | POA: Diagnosis not present

## 2019-11-21 DIAGNOSIS — I10 Essential (primary) hypertension: Secondary | ICD-10-CM | POA: Diagnosis not present

## 2019-11-21 DIAGNOSIS — E7800 Pure hypercholesterolemia, unspecified: Secondary | ICD-10-CM | POA: Diagnosis not present

## 2019-12-23 DIAGNOSIS — E119 Type 2 diabetes mellitus without complications: Secondary | ICD-10-CM | POA: Diagnosis not present

## 2019-12-23 DIAGNOSIS — E039 Hypothyroidism, unspecified: Secondary | ICD-10-CM | POA: Diagnosis not present

## 2019-12-23 DIAGNOSIS — N1831 Chronic kidney disease, stage 3a: Secondary | ICD-10-CM | POA: Diagnosis not present

## 2019-12-23 DIAGNOSIS — I10 Essential (primary) hypertension: Secondary | ICD-10-CM | POA: Diagnosis not present

## 2019-12-23 DIAGNOSIS — F5109 Other insomnia not due to a substance or known physiological condition: Secondary | ICD-10-CM | POA: Diagnosis not present

## 2019-12-23 DIAGNOSIS — E7800 Pure hypercholesterolemia, unspecified: Secondary | ICD-10-CM | POA: Diagnosis not present

## 2019-12-23 DIAGNOSIS — D649 Anemia, unspecified: Secondary | ICD-10-CM | POA: Diagnosis not present

## 2019-12-24 DIAGNOSIS — E559 Vitamin D deficiency, unspecified: Secondary | ICD-10-CM | POA: Diagnosis not present

## 2019-12-24 DIAGNOSIS — M810 Age-related osteoporosis without current pathological fracture: Secondary | ICD-10-CM | POA: Diagnosis not present

## 2019-12-25 DIAGNOSIS — E782 Mixed hyperlipidemia: Secondary | ICD-10-CM | POA: Diagnosis not present

## 2019-12-25 DIAGNOSIS — E7800 Pure hypercholesterolemia, unspecified: Secondary | ICD-10-CM | POA: Diagnosis not present

## 2019-12-25 DIAGNOSIS — R768 Other specified abnormal immunological findings in serum: Secondary | ICD-10-CM | POA: Diagnosis not present

## 2019-12-25 DIAGNOSIS — N183 Chronic kidney disease, stage 3 unspecified: Secondary | ICD-10-CM | POA: Diagnosis not present

## 2019-12-25 DIAGNOSIS — E039 Hypothyroidism, unspecified: Secondary | ICD-10-CM | POA: Diagnosis not present

## 2019-12-25 DIAGNOSIS — I10 Essential (primary) hypertension: Secondary | ICD-10-CM | POA: Diagnosis not present

## 2019-12-25 DIAGNOSIS — D649 Anemia, unspecified: Secondary | ICD-10-CM | POA: Diagnosis not present

## 2019-12-25 DIAGNOSIS — G43009 Migraine without aura, not intractable, without status migrainosus: Secondary | ICD-10-CM | POA: Diagnosis not present

## 2019-12-25 DIAGNOSIS — E538 Deficiency of other specified B group vitamins: Secondary | ICD-10-CM | POA: Diagnosis not present

## 2019-12-25 DIAGNOSIS — E119 Type 2 diabetes mellitus without complications: Secondary | ICD-10-CM | POA: Diagnosis not present

## 2019-12-31 DIAGNOSIS — M19071 Primary osteoarthritis, right ankle and foot: Secondary | ICD-10-CM | POA: Diagnosis not present

## 2019-12-31 DIAGNOSIS — M19072 Primary osteoarthritis, left ankle and foot: Secondary | ICD-10-CM | POA: Diagnosis not present

## 2020-01-02 DIAGNOSIS — M19071 Primary osteoarthritis, right ankle and foot: Secondary | ICD-10-CM | POA: Diagnosis not present

## 2020-01-02 DIAGNOSIS — M79672 Pain in left foot: Secondary | ICD-10-CM | POA: Diagnosis not present

## 2020-01-02 DIAGNOSIS — M79671 Pain in right foot: Secondary | ICD-10-CM | POA: Diagnosis not present

## 2020-01-02 DIAGNOSIS — M19072 Primary osteoarthritis, left ankle and foot: Secondary | ICD-10-CM | POA: Diagnosis not present

## 2020-01-13 DIAGNOSIS — N1832 Chronic kidney disease, stage 3b: Secondary | ICD-10-CM | POA: Diagnosis not present

## 2020-01-13 DIAGNOSIS — Z79899 Other long term (current) drug therapy: Secondary | ICD-10-CM | POA: Diagnosis not present

## 2020-01-13 DIAGNOSIS — D631 Anemia in chronic kidney disease: Secondary | ICD-10-CM | POA: Diagnosis not present

## 2020-01-13 DIAGNOSIS — R7989 Other specified abnormal findings of blood chemistry: Secondary | ICD-10-CM | POA: Diagnosis not present

## 2020-01-13 DIAGNOSIS — Z794 Long term (current) use of insulin: Secondary | ICD-10-CM | POA: Diagnosis not present

## 2020-01-13 DIAGNOSIS — E1122 Type 2 diabetes mellitus with diabetic chronic kidney disease: Secondary | ICD-10-CM | POA: Diagnosis not present

## 2020-01-13 DIAGNOSIS — I129 Hypertensive chronic kidney disease with stage 1 through stage 4 chronic kidney disease, or unspecified chronic kidney disease: Secondary | ICD-10-CM | POA: Diagnosis not present

## 2020-01-14 DIAGNOSIS — D2272 Melanocytic nevi of left lower limb, including hip: Secondary | ICD-10-CM | POA: Diagnosis not present

## 2020-01-14 DIAGNOSIS — D2372 Other benign neoplasm of skin of left lower limb, including hip: Secondary | ICD-10-CM | POA: Diagnosis not present

## 2020-01-14 DIAGNOSIS — L821 Other seborrheic keratosis: Secondary | ICD-10-CM | POA: Diagnosis not present

## 2020-01-14 DIAGNOSIS — H61002 Unspecified perichondritis of left external ear: Secondary | ICD-10-CM | POA: Diagnosis not present

## 2020-01-14 DIAGNOSIS — D225 Melanocytic nevi of trunk: Secondary | ICD-10-CM | POA: Diagnosis not present

## 2020-01-14 DIAGNOSIS — L814 Other melanin hyperpigmentation: Secondary | ICD-10-CM | POA: Diagnosis not present

## 2020-01-14 DIAGNOSIS — D2262 Melanocytic nevi of left upper limb, including shoulder: Secondary | ICD-10-CM | POA: Diagnosis not present

## 2020-01-14 DIAGNOSIS — D1801 Hemangioma of skin and subcutaneous tissue: Secondary | ICD-10-CM | POA: Diagnosis not present

## 2020-01-14 DIAGNOSIS — D485 Neoplasm of uncertain behavior of skin: Secondary | ICD-10-CM | POA: Diagnosis not present

## 2020-01-14 DIAGNOSIS — I788 Other diseases of capillaries: Secondary | ICD-10-CM | POA: Diagnosis not present

## 2020-01-14 DIAGNOSIS — D2271 Melanocytic nevi of right lower limb, including hip: Secondary | ICD-10-CM | POA: Diagnosis not present

## 2020-01-21 ENCOUNTER — Other Ambulatory Visit: Payer: Self-pay

## 2020-01-21 ENCOUNTER — Ambulatory Visit: Payer: Medicare Other | Admitting: Obstetrics and Gynecology

## 2020-01-21 DIAGNOSIS — E785 Hyperlipidemia, unspecified: Secondary | ICD-10-CM | POA: Diagnosis not present

## 2020-01-21 DIAGNOSIS — E039 Hypothyroidism, unspecified: Secondary | ICD-10-CM | POA: Diagnosis not present

## 2020-01-21 DIAGNOSIS — H43812 Vitreous degeneration, left eye: Secondary | ICD-10-CM | POA: Diagnosis not present

## 2020-01-21 DIAGNOSIS — Z4681 Encounter for fitting and adjustment of insulin pump: Secondary | ICD-10-CM | POA: Diagnosis not present

## 2020-01-21 DIAGNOSIS — N1832 Chronic kidney disease, stage 3b: Secondary | ICD-10-CM | POA: Diagnosis not present

## 2020-01-21 DIAGNOSIS — E1129 Type 2 diabetes mellitus with other diabetic kidney complication: Secondary | ICD-10-CM | POA: Diagnosis not present

## 2020-01-21 DIAGNOSIS — D649 Anemia, unspecified: Secondary | ICD-10-CM | POA: Diagnosis not present

## 2020-01-21 DIAGNOSIS — Z794 Long term (current) use of insulin: Secondary | ICD-10-CM | POA: Diagnosis not present

## 2020-01-21 DIAGNOSIS — M8588 Other specified disorders of bone density and structure, other site: Secondary | ICD-10-CM | POA: Diagnosis not present

## 2020-01-21 DIAGNOSIS — I129 Hypertensive chronic kidney disease with stage 1 through stage 4 chronic kidney disease, or unspecified chronic kidney disease: Secondary | ICD-10-CM | POA: Diagnosis not present

## 2020-01-21 DIAGNOSIS — M353 Polymyalgia rheumatica: Secondary | ICD-10-CM | POA: Diagnosis not present

## 2020-01-21 DIAGNOSIS — I712 Thoracic aortic aneurysm, without rupture: Secondary | ICD-10-CM | POA: Diagnosis not present

## 2020-01-21 NOTE — Progress Notes (Deleted)
69 y.o. G2P2 Married Caucasian female here for annual exam.    PCP: Josetta Huddle, MD    No LMP recorded (lmp unknown). Patient has had a hysterectomy.           Sexually active: {yes no:314532}  The current method of family planning is status post hysterectomy.    Exercising: {yes no:314532}  {types:19826} Smoker:  no  Health Maintenance: Pap:  09-07-16 Neg, 09-30-15 Neg,06-23-14 Neg:Neg HR HPV History of abnormal Pap:  Yes, Hx of cryotherapy in her 53's MMG: 03-10-19  3D/Neg/density B/BiRads1 Colonoscopy: 2019 normal with Dr.Medoff;next 5 years BMD:  2021  Result :with [facility] TDaP: PCP Gardasil:   no HIV: 01/2011 NR Hep C: 01/2011 Neg Screening Labs:  Hb today: ***, Urine today: ***   reports that she has never smoked. She has never used smokeless tobacco. She reports that she does not drink alcohol and does not use drugs.  Past Medical History:  Diagnosis Date  . Abnormal Pap smear of cervix    Hx of cryotherapy to cervix in her 63s  . Abnormal uterine bleeding    history of fibroids  . Anemia   . Chronic kidney disease    stage 2  . Diabetes mellitus without complication ([place])    type 2 diagnosed 3 years ago  . DJD (degenerative joint disease)   . Fibroid   . Heart murmur    functional  . History of hypertension NO MEDS SINCE LAP GASTRIC BAND 2010 --  WT. LOSS  . Hypertension   . Hypothyroidism   . Low testosterone level in female 2019  . Low testosterone level in female   . 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  STATES SHE CARRIES EPI PEN--BUT HAS NOT HAD ANY RECENT EPISODES.  STATES MANY OF THE MEDICATIONS SHE TAKES ARE TO HELP THIS PROBLEM - INCLUDING  Ketotifen-mast cell inhibitor-not approved in [place].   . Migraine   . 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 Osteopenia   . Other fracture of sacrum, initial encounter for closed fracture  ([place]) 06/2017  . PMR (polymyalgia rheumatica) (H[facility])   . PONV (postoperative nausea and vomiting)   . Spondylosis     Past Surgical History:  Procedure Laterality Date  . ABDOMINAL HYSTERECTOMY  02/20/2011   Procedure: HYSTERECTOMY ABDOMINAL;  Surgeon: Bennetta Laos, MD;  Location: [facility];  Service: Gynecology;  Laterality: N/A;  . BREAST BIOPSY    . CERVICAL BIOPSY  W/ LOOP ELECTRODE EXCISION  1992  . CERVIX LESION DESTRUCTION    . [place]   X2  . CHOLECYSTECTOMY    . COLONOSCOPY    . DILATION AND CURETTAGE OF UTERUS     heavy menses  . HYSTEROSCOPY  06/1995   HYSTEROSCOPIC MYOMECTOMY  . HYSTEROSCOPY  06/1998   D&C, HYSTEROSCOPY  . KNEE SURGERY     X2 right knee  . LAPAROSCOPIC GASTRIC BANDING  08-03-2008   [facility] APS SYSTEM  . LESION REMOVAL N/A 09/02/2012   Procedure: EXCISION VAGINAL LESION;  Surgeon: Terrance Mass, MD;  Location: [facility];  Service: Gynecology;  Laterality: N/A;  cpt 57100  one hour  . PUBOVAGINAL SLING  02/20/2011   Procedure: Gaynelle Arabian;  Surgeon: Ailene Rud, MD;  Location: [facility];  Service: Urology;  Laterality: N/A;  . RECTOCELE REPAIR N/A  12/19/2016   Procedure: POSTERIOR REPAIR (RECTOCELE);  Surgeon: Nunzio Cobbs, MD;  Location: [facility];  Service: Gynecology;  Laterality: N/A;  1.25 hours  . SALPINGOOPHORECTOMY  02/20/2011   Procedure: SALPINGO OOPHERECTOMY;  Surgeon: Bennetta Laos, MD;  Location: [facility];  Service: Gynecology;  Laterality: Bilateral;  . UPPER GASTROINTESTINAL ENDOSCOPY  06/2011  . VENTRAL HERNIA REPAIR  05/24/2011   Procedure: LAPAROSCOPIC VENTRAL HERNIA;  Surgeon: Pedro Earls, MD;  Location: [facility];  Service: General;  Laterality: N/A;  . WOUND EXPLORATION Left 02/22/2018   Procedure: EXPLORATION OF MASS  BEHIND LEFT EAR;  Surgeon: Johnathan Hausen, MD;  Location: [facility];  Service: General;  Laterality: Left;    Current  Outpatient Medications  Medication Sig Dispense Refill  . cetirizine (ZYRTEC) 10 MG tablet Take 10 mg by mouth daily.     . clonazePAM (KLONOPIN) 2 MG tablet Take 1 tablet by mouth as needed.    Marland Kitchen denosumab (PROLIA) 60 MG/ML SOSY injection Inject 60 mg into the skin every 6 (six) months.    . EPINEPHrine (EPI-PEN) 0.3 mg/0.3 mL DEVI Inject into the muscle as needed.    Marland Kitchen estradiol (ESTRACE) 0.1 MG/GM vaginal cream Use 1/2 g vaginally every night for the first 2 weeks, then use 1/2 g vaginally two or three times per week as needed to maintain symptom relief. 42.5 g 1  . famotidine (PEPCID) 20 MG tablet     . finasteride (PROSCAR) 5 MG tablet Take by mouth.    . hydrOXYzine (ATARAX/VISTARIL) 50 MG tablet Take 1 tablet by mouth 2 (two) times daily.    . hyoscyamine (LEVSIN SL) 0.125 MG SL tablet Place 1 tablet under the tongue as needed.    Marland Kitchen losartan (COZAAR) 25 MG tablet Take 2 tablets by mouth daily.    . meloxicam (MOBIC) 15 MG tablet     . montelukast (SINGULAIR) 10 MG tablet Take 10 mg by mouth at bedtime.     . ondansetron (ZOFRAN-ODT) 4 MG disintegrating tablet Take 1 tablet by mouth as needed.    Marland Kitchen PRESCRIPTION MEDICATION Take 1 tablet by mouth 2 (two) times daily. Ketotifen 5mg  tablet[place]. For mast cell problem    . rizatriptan (MAXALT) 10 MG tablet     . Rosuvastatin Calcium 20 MG CPSP     . SYNTHROID 100 MCG tablet     . topiramate (TOPAMAX) 50 MG tablet Take 50 mg by mouth 2 (two) times daily.    Marland Kitchen UNABLE TO FIND 2 capsules 2 (two) times daily. Med Name: AlgaeCal Plus    . VASCEPA 1 g CAPS Take 1 capsule by mouth 2 (two) times daily.    Marland Kitchen zolmitriptan (ZOMIG) 5 MG tablet Take 5 mg by mouth as needed for migraine.     No current facility-administered medications for this visit.    Family History  Problem Relation Age of Onset  . Breast cancer Mother        2's  . Hypertension Mother   . Thyroid disease Mother        hypothyroid  . Cancer Father         LUNG  . Thyroid disease Maternal Grandmother        hypothyroid    Review of Systems  Exam:   LMP  (LMP Unknown)     General appearance: alert, cooperative and appears stated age Head: normocephalic, without obvious abnormality, atraumatic Neck: no adenopathy, supple,  symmetrical, trachea midline and thyroid normal to inspection and palpation Lungs: clear to auscultation bilaterally Breasts: normal appearance, no masses or tenderness, No nipple retraction or dimpling, No nipple discharge or bleeding, No axillary adenopathy Heart: regular rate and rhythm Abdomen: soft, non-tender; no masses, no organomegaly Extremities: extremities normal, atraumatic, no cyanosis or edema Skin: skin color, texture, turgor normal. No rashes or lesions Lymph nodes: cervical, supraclavicular, and axillary nodes normal. Neurologic: grossly normal  Pelvic: External genitalia:  no lesions              No abnormal inguinal nodes palpated.              Urethra:  normal appearing urethra with no masses, tenderness or lesions              Bartholins and Skenes: normal                 Vagina: normal appearing vagina with normal color and discharge, no lesions              Cervix: no lesions              Pap taken: {yes no:314532} Bimanual Exam:  Uterus:  normal size, contour, position, consistency, mobility, non-tender              Adnexa: no mass, fullness, tenderness              Rectal exam: {yes no:314532}.  Confirms.              Anus:  normal sphincter tone, no lesions  Chaperone was present for exam.  Assessment:   Well woman visit with normal exam.   Plan: Mammogram screening discussed. Self breast awareness reviewed. Pap and HR HPV as above. Guidelines for Calcium, Vitamin D, regular exercise program including cardiovascular and weight bearing exercise.   Follow up annually and prn.   Additional counseling given.  {yes [id number]. _______ minutes face to face time of which over 50%  was spent in counseling.    After visit summary provided.

## 2020-01-23 DIAGNOSIS — M2550 Pain in unspecified joint: Secondary | ICD-10-CM | POA: Diagnosis not present

## 2020-01-23 DIAGNOSIS — M316 Other giant cell arteritis: Secondary | ICD-10-CM | POA: Diagnosis not present

## 2020-01-23 DIAGNOSIS — M353 Polymyalgia rheumatica: Secondary | ICD-10-CM | POA: Diagnosis not present

## 2020-01-23 DIAGNOSIS — E669 Obesity, unspecified: Secondary | ICD-10-CM | POA: Diagnosis not present

## 2020-01-23 DIAGNOSIS — Z6831 Body mass index (BMI) 31.0-31.9, adult: Secondary | ICD-10-CM | POA: Diagnosis not present

## 2020-01-23 DIAGNOSIS — R5383 Other fatigue: Secondary | ICD-10-CM | POA: Diagnosis not present

## 2020-01-23 DIAGNOSIS — Z7952 Long term (current) use of systemic steroids: Secondary | ICD-10-CM | POA: Diagnosis not present

## 2020-01-26 ENCOUNTER — Other Ambulatory Visit: Payer: Self-pay | Admitting: Internal Medicine

## 2020-01-26 ENCOUNTER — Other Ambulatory Visit: Payer: Self-pay | Admitting: Obstetrics and Gynecology

## 2020-01-26 DIAGNOSIS — E119 Type 2 diabetes mellitus without complications: Secondary | ICD-10-CM | POA: Diagnosis not present

## 2020-01-26 DIAGNOSIS — E782 Mixed hyperlipidemia: Secondary | ICD-10-CM | POA: Diagnosis not present

## 2020-01-26 DIAGNOSIS — G43009 Migraine without aura, not intractable, without status migrainosus: Secondary | ICD-10-CM | POA: Diagnosis not present

## 2020-01-26 DIAGNOSIS — E7800 Pure hypercholesterolemia, unspecified: Secondary | ICD-10-CM | POA: Diagnosis not present

## 2020-01-26 DIAGNOSIS — I10 Essential (primary) hypertension: Secondary | ICD-10-CM | POA: Diagnosis not present

## 2020-01-26 DIAGNOSIS — N183 Chronic kidney disease, stage 3 unspecified: Secondary | ICD-10-CM | POA: Diagnosis not present

## 2020-01-26 DIAGNOSIS — E039 Hypothyroidism, unspecified: Secondary | ICD-10-CM | POA: Diagnosis not present

## 2020-01-26 DIAGNOSIS — D649 Anemia, unspecified: Secondary | ICD-10-CM | POA: Diagnosis not present

## 2020-01-26 DIAGNOSIS — Z1231 Encounter for screening mammogram for malignant neoplasm of breast: Secondary | ICD-10-CM

## 2020-02-16 ENCOUNTER — Ambulatory Visit (INDEPENDENT_AMBULATORY_CARE_PROVIDER_SITE_OTHER): Payer: Medicare Other | Admitting: Psychology

## 2020-02-16 DIAGNOSIS — F3489 Other specified persistent mood disorders: Secondary | ICD-10-CM

## 2020-02-23 ENCOUNTER — Ambulatory Visit: Payer: Medicare Other | Admitting: Psychology

## 2020-02-25 DIAGNOSIS — E7800 Pure hypercholesterolemia, unspecified: Secondary | ICD-10-CM | POA: Diagnosis not present

## 2020-02-25 DIAGNOSIS — I10 Essential (primary) hypertension: Secondary | ICD-10-CM | POA: Diagnosis not present

## 2020-02-25 DIAGNOSIS — E782 Mixed hyperlipidemia: Secondary | ICD-10-CM | POA: Diagnosis not present

## 2020-02-25 DIAGNOSIS — E039 Hypothyroidism, unspecified: Secondary | ICD-10-CM | POA: Diagnosis not present

## 2020-02-25 DIAGNOSIS — G43009 Migraine without aura, not intractable, without status migrainosus: Secondary | ICD-10-CM | POA: Diagnosis not present

## 2020-02-25 DIAGNOSIS — E119 Type 2 diabetes mellitus without complications: Secondary | ICD-10-CM | POA: Diagnosis not present

## 2020-02-25 DIAGNOSIS — N183 Chronic kidney disease, stage 3 unspecified: Secondary | ICD-10-CM | POA: Diagnosis not present

## 2020-02-25 DIAGNOSIS — D649 Anemia, unspecified: Secondary | ICD-10-CM | POA: Diagnosis not present

## 2020-02-27 DIAGNOSIS — R197 Diarrhea, unspecified: Secondary | ICD-10-CM | POA: Diagnosis not present

## 2020-02-27 DIAGNOSIS — Z23 Encounter for immunization: Secondary | ICD-10-CM | POA: Diagnosis not present

## 2020-02-27 DIAGNOSIS — D8940 Mast cell activation, unspecified: Secondary | ICD-10-CM | POA: Diagnosis not present

## 2020-03-03 DIAGNOSIS — H26493 Other secondary cataract, bilateral: Secondary | ICD-10-CM | POA: Diagnosis not present

## 2020-03-03 DIAGNOSIS — I10 Essential (primary) hypertension: Secondary | ICD-10-CM | POA: Diagnosis not present

## 2020-03-03 DIAGNOSIS — N1832 Chronic kidney disease, stage 3b: Secondary | ICD-10-CM | POA: Diagnosis not present

## 2020-03-03 DIAGNOSIS — Z961 Presence of intraocular lens: Secondary | ICD-10-CM | POA: Diagnosis not present

## 2020-03-03 DIAGNOSIS — E1129 Type 2 diabetes mellitus with other diabetic kidney complication: Secondary | ICD-10-CM | POA: Diagnosis not present

## 2020-03-03 DIAGNOSIS — E119 Type 2 diabetes mellitus without complications: Secondary | ICD-10-CM | POA: Diagnosis not present

## 2020-03-03 DIAGNOSIS — H26491 Other secondary cataract, right eye: Secondary | ICD-10-CM | POA: Diagnosis not present

## 2020-03-03 DIAGNOSIS — I129 Hypertensive chronic kidney disease with stage 1 through stage 4 chronic kidney disease, or unspecified chronic kidney disease: Secondary | ICD-10-CM | POA: Diagnosis not present

## 2020-03-08 DIAGNOSIS — H26492 Other secondary cataract, left eye: Secondary | ICD-10-CM | POA: Diagnosis not present

## 2020-03-10 ENCOUNTER — Other Ambulatory Visit: Payer: Self-pay

## 2020-03-10 ENCOUNTER — Ambulatory Visit
Admission: RE | Admit: 2020-03-10 | Discharge: 2020-03-10 | Disposition: A | Discharge status: Home / Self Care | Payer: Medicare Other | Source: Ambulatory Visit | Attending: Obstetrics and Gynecology | Admitting: Obstetrics and Gynecology

## 2020-03-10 DIAGNOSIS — Z1231 Encounter for screening mammogram for malignant neoplasm of breast: Secondary | ICD-10-CM | POA: Diagnosis not present

## 2020-03-12 ENCOUNTER — Other Ambulatory Visit: Payer: Self-pay | Admitting: Obstetrics and Gynecology

## 2020-03-12 DIAGNOSIS — Z780 Asymptomatic menopausal state: Secondary | ICD-10-CM

## 2020-03-12 NOTE — Telephone Encounter (Signed)
Medication refill request: Estradiol 0.01%  Last AEX:  08/25/19  Next AEX: 08/26/19 Last MMG (if hormonal medication request): 03/10/19  Neg  Refill authorized: 42.5g / 1

## 2020-03-17 DIAGNOSIS — Z87892 Personal history of anaphylaxis: Secondary | ICD-10-CM | POA: Diagnosis not present

## 2020-03-17 DIAGNOSIS — M353 Polymyalgia rheumatica: Secondary | ICD-10-CM | POA: Diagnosis not present

## 2020-03-18 ENCOUNTER — Ambulatory Visit (INDEPENDENT_AMBULATORY_CARE_PROVIDER_SITE_OTHER): Payer: Medicare Other | Admitting: Psychology

## 2020-03-18 DIAGNOSIS — F3489 Other specified persistent mood disorders: Secondary | ICD-10-CM

## 2020-03-23 DIAGNOSIS — N183 Chronic kidney disease, stage 3 unspecified: Secondary | ICD-10-CM | POA: Diagnosis not present

## 2020-03-23 DIAGNOSIS — E782 Mixed hyperlipidemia: Secondary | ICD-10-CM | POA: Diagnosis not present

## 2020-03-23 DIAGNOSIS — M8580 Other specified disorders of bone density and structure, unspecified site: Secondary | ICD-10-CM | POA: Diagnosis not present

## 2020-03-23 DIAGNOSIS — E119 Type 2 diabetes mellitus without complications: Secondary | ICD-10-CM | POA: Diagnosis not present

## 2020-03-23 DIAGNOSIS — E7800 Pure hypercholesterolemia, unspecified: Secondary | ICD-10-CM | POA: Diagnosis not present

## 2020-03-23 DIAGNOSIS — G43009 Migraine without aura, not intractable, without status migrainosus: Secondary | ICD-10-CM | POA: Diagnosis not present

## 2020-03-23 DIAGNOSIS — D649 Anemia, unspecified: Secondary | ICD-10-CM | POA: Diagnosis not present

## 2020-03-23 DIAGNOSIS — I10 Essential (primary) hypertension: Secondary | ICD-10-CM | POA: Diagnosis not present

## 2020-03-23 DIAGNOSIS — E039 Hypothyroidism, unspecified: Secondary | ICD-10-CM | POA: Diagnosis not present

## 2020-03-24 DIAGNOSIS — M353 Polymyalgia rheumatica: Secondary | ICD-10-CM | POA: Diagnosis not present

## 2020-03-24 DIAGNOSIS — I10 Essential (primary) hypertension: Secondary | ICD-10-CM | POA: Diagnosis not present

## 2020-03-24 DIAGNOSIS — R197 Diarrhea, unspecified: Secondary | ICD-10-CM | POA: Diagnosis not present

## 2020-03-24 DIAGNOSIS — D8940 Mast cell activation, unspecified: Secondary | ICD-10-CM | POA: Diagnosis not present

## 2020-03-24 DIAGNOSIS — E119 Type 2 diabetes mellitus without complications: Secondary | ICD-10-CM | POA: Diagnosis not present

## 2020-03-29 ENCOUNTER — Other Ambulatory Visit: Payer: Self-pay | Admitting: Internal Medicine

## 2020-03-29 DIAGNOSIS — Z87892 Personal history of anaphylaxis: Secondary | ICD-10-CM | POA: Diagnosis not present

## 2020-03-29 DIAGNOSIS — E1169 Type 2 diabetes mellitus with other specified complication: Secondary | ICD-10-CM

## 2020-03-30 DIAGNOSIS — M353 Polymyalgia rheumatica: Secondary | ICD-10-CM | POA: Diagnosis not present

## 2020-03-30 DIAGNOSIS — M542 Cervicalgia: Secondary | ICD-10-CM | POA: Diagnosis not present

## 2020-03-30 DIAGNOSIS — M2550 Pain in unspecified joint: Secondary | ICD-10-CM | POA: Diagnosis not present

## 2020-03-30 DIAGNOSIS — R635 Abnormal weight gain: Secondary | ICD-10-CM | POA: Diagnosis not present

## 2020-03-30 DIAGNOSIS — E663 Overweight: Secondary | ICD-10-CM | POA: Diagnosis not present

## 2020-03-30 DIAGNOSIS — Z6829 Body mass index (BMI) 29.0-29.9, adult: Secondary | ICD-10-CM | POA: Diagnosis not present

## 2020-04-01 DIAGNOSIS — Z7952 Long term (current) use of systemic steroids: Secondary | ICD-10-CM | POA: Diagnosis not present

## 2020-04-01 DIAGNOSIS — M316 Other giant cell arteritis: Secondary | ICD-10-CM | POA: Diagnosis not present

## 2020-04-01 DIAGNOSIS — M1990 Unspecified osteoarthritis, unspecified site: Secondary | ICD-10-CM | POA: Diagnosis not present

## 2020-04-01 DIAGNOSIS — M353 Polymyalgia rheumatica: Secondary | ICD-10-CM | POA: Diagnosis not present

## 2020-04-06 ENCOUNTER — Other Ambulatory Visit: Payer: Medicare Other

## 2020-04-13 DIAGNOSIS — L501 Idiopathic urticaria: Secondary | ICD-10-CM | POA: Diagnosis not present

## 2020-04-13 DIAGNOSIS — D8940 Mast cell activation, unspecified: Secondary | ICD-10-CM | POA: Diagnosis not present

## 2020-04-14 ENCOUNTER — Other Ambulatory Visit: Payer: Self-pay | Admitting: Internal Medicine

## 2020-04-14 DIAGNOSIS — N1832 Chronic kidney disease, stage 3b: Secondary | ICD-10-CM | POA: Diagnosis not present

## 2020-04-14 DIAGNOSIS — M353 Polymyalgia rheumatica: Secondary | ICD-10-CM | POA: Diagnosis not present

## 2020-04-14 DIAGNOSIS — I129 Hypertensive chronic kidney disease with stage 1 through stage 4 chronic kidney disease, or unspecified chronic kidney disease: Secondary | ICD-10-CM | POA: Diagnosis not present

## 2020-04-14 DIAGNOSIS — E1169 Type 2 diabetes mellitus with other specified complication: Secondary | ICD-10-CM

## 2020-04-14 DIAGNOSIS — E1129 Type 2 diabetes mellitus with other diabetic kidney complication: Secondary | ICD-10-CM | POA: Diagnosis not present

## 2020-04-15 ENCOUNTER — Other Ambulatory Visit: Payer: Self-pay | Admitting: Internal Medicine

## 2020-04-15 ENCOUNTER — Other Ambulatory Visit: Payer: Medicare Other

## 2020-04-15 ENCOUNTER — Ambulatory Visit
Admission: RE | Admit: 2020-04-15 | Discharge: 2020-04-15 | Disposition: A | Discharge status: Home / Self Care | Payer: Medicare Other | Source: Ambulatory Visit | Attending: Internal Medicine | Admitting: Internal Medicine

## 2020-04-15 ENCOUNTER — Ambulatory Visit
Admission: RE | Admit: 2020-04-15 | Discharge: 2020-04-15 | Disposition: A | Discharge status: Home / Self Care | Payer: No Typology Code available for payment source | Source: Ambulatory Visit | Attending: Internal Medicine | Admitting: Internal Medicine

## 2020-04-15 ENCOUNTER — Ambulatory Visit: Admission: RE | Admit: 2020-04-15 | Payer: Medicare Other | Source: Ambulatory Visit

## 2020-04-15 DIAGNOSIS — I119 Hypertensive heart disease without heart failure: Secondary | ICD-10-CM

## 2020-04-15 DIAGNOSIS — I10 Essential (primary) hypertension: Secondary | ICD-10-CM

## 2020-04-15 DIAGNOSIS — E1169 Type 2 diabetes mellitus with other specified complication: Secondary | ICD-10-CM

## 2020-04-15 DIAGNOSIS — I110 Hypertensive heart disease with heart failure: Secondary | ICD-10-CM

## 2020-04-26 DIAGNOSIS — G43009 Migraine without aura, not intractable, without status migrainosus: Secondary | ICD-10-CM | POA: Diagnosis not present

## 2020-04-26 DIAGNOSIS — N183 Chronic kidney disease, stage 3 unspecified: Secondary | ICD-10-CM | POA: Diagnosis not present

## 2020-04-26 DIAGNOSIS — E039 Hypothyroidism, unspecified: Secondary | ICD-10-CM | POA: Diagnosis not present

## 2020-04-26 DIAGNOSIS — D649 Anemia, unspecified: Secondary | ICD-10-CM | POA: Diagnosis not present

## 2020-04-26 DIAGNOSIS — E7800 Pure hypercholesterolemia, unspecified: Secondary | ICD-10-CM | POA: Diagnosis not present

## 2020-04-26 DIAGNOSIS — E782 Mixed hyperlipidemia: Secondary | ICD-10-CM | POA: Diagnosis not present

## 2020-04-26 DIAGNOSIS — I10 Essential (primary) hypertension: Secondary | ICD-10-CM | POA: Diagnosis not present

## 2020-04-26 DIAGNOSIS — M8580 Other specified disorders of bone density and structure, unspecified site: Secondary | ICD-10-CM | POA: Diagnosis not present

## 2020-04-26 DIAGNOSIS — E119 Type 2 diabetes mellitus without complications: Secondary | ICD-10-CM | POA: Diagnosis not present

## 2020-04-27 DIAGNOSIS — Z20828 Contact with and (suspected) exposure to other viral communicable diseases: Secondary | ICD-10-CM | POA: Diagnosis not present

## 2020-05-05 DIAGNOSIS — T07XXXA Unspecified multiple injuries, initial encounter: Secondary | ICD-10-CM | POA: Diagnosis not present

## 2020-05-05 DIAGNOSIS — E039 Hypothyroidism, unspecified: Secondary | ICD-10-CM | POA: Diagnosis not present

## 2020-05-05 DIAGNOSIS — N183 Chronic kidney disease, stage 3 unspecified: Secondary | ICD-10-CM | POA: Diagnosis not present

## 2020-05-05 DIAGNOSIS — M8580 Other specified disorders of bone density and structure, unspecified site: Secondary | ICD-10-CM | POA: Diagnosis not present

## 2020-05-05 DIAGNOSIS — K589 Irritable bowel syndrome without diarrhea: Secondary | ICD-10-CM | POA: Diagnosis not present

## 2020-05-05 DIAGNOSIS — E119 Type 2 diabetes mellitus without complications: Secondary | ICD-10-CM | POA: Diagnosis not present

## 2020-05-05 DIAGNOSIS — E538 Deficiency of other specified B group vitamins: Secondary | ICD-10-CM | POA: Diagnosis not present

## 2020-05-05 DIAGNOSIS — Q822 Mastocytosis: Secondary | ICD-10-CM | POA: Diagnosis not present

## 2020-05-05 DIAGNOSIS — G43109 Migraine with aura, not intractable, without status migrainosus: Secondary | ICD-10-CM | POA: Diagnosis not present

## 2020-05-05 DIAGNOSIS — I10 Essential (primary) hypertension: Secondary | ICD-10-CM | POA: Diagnosis not present

## 2020-05-05 DIAGNOSIS — M353 Polymyalgia rheumatica: Secondary | ICD-10-CM | POA: Diagnosis not present

## 2020-05-10 ENCOUNTER — Other Ambulatory Visit: Payer: Medicare Other

## 2020-05-21 DIAGNOSIS — E119 Type 2 diabetes mellitus without complications: Secondary | ICD-10-CM | POA: Diagnosis not present

## 2020-05-21 DIAGNOSIS — E039 Hypothyroidism, unspecified: Secondary | ICD-10-CM | POA: Diagnosis not present

## 2020-05-21 DIAGNOSIS — E782 Mixed hyperlipidemia: Secondary | ICD-10-CM | POA: Diagnosis not present

## 2020-05-21 DIAGNOSIS — N183 Chronic kidney disease, stage 3 unspecified: Secondary | ICD-10-CM | POA: Diagnosis not present

## 2020-05-21 DIAGNOSIS — D649 Anemia, unspecified: Secondary | ICD-10-CM | POA: Diagnosis not present

## 2020-05-21 DIAGNOSIS — I10 Essential (primary) hypertension: Secondary | ICD-10-CM | POA: Diagnosis not present

## 2020-05-21 DIAGNOSIS — G43009 Migraine without aura, not intractable, without status migrainosus: Secondary | ICD-10-CM | POA: Diagnosis not present

## 2020-05-21 DIAGNOSIS — E7800 Pure hypercholesterolemia, unspecified: Secondary | ICD-10-CM | POA: Diagnosis not present

## 2020-05-21 DIAGNOSIS — M8580 Other specified disorders of bone density and structure, unspecified site: Secondary | ICD-10-CM | POA: Diagnosis not present

## 2020-05-24 DIAGNOSIS — Z9884 Bariatric surgery status: Secondary | ICD-10-CM | POA: Diagnosis not present

## 2020-05-24 DIAGNOSIS — E785 Hyperlipidemia, unspecified: Secondary | ICD-10-CM | POA: Diagnosis not present

## 2020-05-24 DIAGNOSIS — M8588 Other specified disorders of bone density and structure, other site: Secondary | ICD-10-CM | POA: Diagnosis not present

## 2020-05-24 DIAGNOSIS — N1832 Chronic kidney disease, stage 3b: Secondary | ICD-10-CM | POA: Diagnosis not present

## 2020-05-24 DIAGNOSIS — E1129 Type 2 diabetes mellitus with other diabetic kidney complication: Secondary | ICD-10-CM | POA: Diagnosis not present

## 2020-05-24 DIAGNOSIS — D8940 Mast cell activation, unspecified: Secondary | ICD-10-CM | POA: Diagnosis not present

## 2020-05-24 DIAGNOSIS — E039 Hypothyroidism, unspecified: Secondary | ICD-10-CM | POA: Diagnosis not present

## 2020-05-24 DIAGNOSIS — D808 Other immunodeficiencies with predominantly antibody defects: Secondary | ICD-10-CM | POA: Diagnosis not present

## 2020-05-24 DIAGNOSIS — G729 Myopathy, unspecified: Secondary | ICD-10-CM | POA: Diagnosis not present

## 2020-05-24 DIAGNOSIS — M353 Polymyalgia rheumatica: Secondary | ICD-10-CM | POA: Diagnosis not present

## 2020-05-24 DIAGNOSIS — Z4681 Encounter for fitting and adjustment of insulin pump: Secondary | ICD-10-CM | POA: Diagnosis not present

## 2020-05-24 DIAGNOSIS — I712 Thoracic aortic aneurysm, without rupture: Secondary | ICD-10-CM | POA: Diagnosis not present

## 2020-05-26 DIAGNOSIS — M47813 Spondylosis without myelopathy or radiculopathy, cervicothoracic region: Secondary | ICD-10-CM | POA: Diagnosis not present

## 2020-05-26 DIAGNOSIS — M8588 Other specified disorders of bone density and structure, other site: Secondary | ICD-10-CM | POA: Diagnosis not present

## 2020-05-26 DIAGNOSIS — N1832 Chronic kidney disease, stage 3b: Secondary | ICD-10-CM | POA: Diagnosis not present

## 2020-05-26 DIAGNOSIS — M4312 Spondylolisthesis, cervical region: Secondary | ICD-10-CM | POA: Diagnosis not present

## 2020-05-26 DIAGNOSIS — Z7952 Long term (current) use of systemic steroids: Secondary | ICD-10-CM | POA: Diagnosis not present

## 2020-05-26 DIAGNOSIS — M353 Polymyalgia rheumatica: Secondary | ICD-10-CM | POA: Diagnosis not present

## 2020-06-02 ENCOUNTER — Other Ambulatory Visit: Payer: Medicare Other

## 2020-06-04 ENCOUNTER — Encounter: Payer: Self-pay | Admitting: Obstetrics and Gynecology

## 2020-06-04 DIAGNOSIS — M85852 Other specified disorders of bone density and structure, left thigh: Secondary | ICD-10-CM | POA: Diagnosis not present

## 2020-06-04 DIAGNOSIS — M85851 Other specified disorders of bone density and structure, right thigh: Secondary | ICD-10-CM | POA: Diagnosis not present

## 2020-06-18 DIAGNOSIS — I10 Essential (primary) hypertension: Secondary | ICD-10-CM | POA: Diagnosis not present

## 2020-06-18 DIAGNOSIS — E782 Mixed hyperlipidemia: Secondary | ICD-10-CM | POA: Diagnosis not present

## 2020-06-18 DIAGNOSIS — E119 Type 2 diabetes mellitus without complications: Secondary | ICD-10-CM | POA: Diagnosis not present

## 2020-06-18 DIAGNOSIS — G43009 Migraine without aura, not intractable, without status migrainosus: Secondary | ICD-10-CM | POA: Diagnosis not present

## 2020-06-18 DIAGNOSIS — N183 Chronic kidney disease, stage 3 unspecified: Secondary | ICD-10-CM | POA: Diagnosis not present

## 2020-06-18 DIAGNOSIS — D649 Anemia, unspecified: Secondary | ICD-10-CM | POA: Diagnosis not present

## 2020-06-18 DIAGNOSIS — E7800 Pure hypercholesterolemia, unspecified: Secondary | ICD-10-CM | POA: Diagnosis not present

## 2020-06-18 DIAGNOSIS — M8580 Other specified disorders of bone density and structure, unspecified site: Secondary | ICD-10-CM | POA: Diagnosis not present

## 2020-06-18 DIAGNOSIS — E039 Hypothyroidism, unspecified: Secondary | ICD-10-CM | POA: Diagnosis not present

## 2020-06-23 DIAGNOSIS — M810 Age-related osteoporosis without current pathological fracture: Secondary | ICD-10-CM | POA: Diagnosis not present

## 2020-06-23 DIAGNOSIS — E559 Vitamin D deficiency, unspecified: Secondary | ICD-10-CM | POA: Diagnosis not present

## 2020-06-23 DIAGNOSIS — R5383 Other fatigue: Secondary | ICD-10-CM | POA: Diagnosis not present

## 2020-06-24 DIAGNOSIS — D649 Anemia, unspecified: Secondary | ICD-10-CM | POA: Diagnosis not present

## 2020-06-24 DIAGNOSIS — R768 Other specified abnormal immunological findings in serum: Secondary | ICD-10-CM | POA: Diagnosis not present

## 2020-06-29 DIAGNOSIS — M25551 Pain in right hip: Secondary | ICD-10-CM | POA: Diagnosis not present

## 2020-06-29 DIAGNOSIS — M810 Age-related osteoporosis without current pathological fracture: Secondary | ICD-10-CM | POA: Diagnosis not present

## 2020-06-29 DIAGNOSIS — M545 Low back pain, unspecified: Secondary | ICD-10-CM | POA: Diagnosis not present

## 2020-07-14 DIAGNOSIS — M7061 Trochanteric bursitis, right hip: Secondary | ICD-10-CM | POA: Diagnosis not present

## 2020-07-14 DIAGNOSIS — E1129 Type 2 diabetes mellitus with other diabetic kidney complication: Secondary | ICD-10-CM | POA: Diagnosis not present

## 2020-07-14 DIAGNOSIS — M19071 Primary osteoarthritis, right ankle and foot: Secondary | ICD-10-CM | POA: Diagnosis not present

## 2020-07-14 DIAGNOSIS — M19072 Primary osteoarthritis, left ankle and foot: Secondary | ICD-10-CM | POA: Diagnosis not present

## 2020-07-14 DIAGNOSIS — I129 Hypertensive chronic kidney disease with stage 1 through stage 4 chronic kidney disease, or unspecified chronic kidney disease: Secondary | ICD-10-CM | POA: Diagnosis not present

## 2020-07-14 DIAGNOSIS — N1832 Chronic kidney disease, stage 3b: Secondary | ICD-10-CM | POA: Diagnosis not present

## 2020-07-22 DIAGNOSIS — I10 Essential (primary) hypertension: Secondary | ICD-10-CM | POA: Diagnosis not present

## 2020-07-22 DIAGNOSIS — D649 Anemia, unspecified: Secondary | ICD-10-CM | POA: Diagnosis not present

## 2020-07-22 DIAGNOSIS — E039 Hypothyroidism, unspecified: Secondary | ICD-10-CM | POA: Diagnosis not present

## 2020-07-22 DIAGNOSIS — G43009 Migraine without aura, not intractable, without status migrainosus: Secondary | ICD-10-CM | POA: Diagnosis not present

## 2020-07-22 DIAGNOSIS — E782 Mixed hyperlipidemia: Secondary | ICD-10-CM | POA: Diagnosis not present

## 2020-07-22 DIAGNOSIS — E119 Type 2 diabetes mellitus without complications: Secondary | ICD-10-CM | POA: Diagnosis not present

## 2020-07-22 DIAGNOSIS — E7800 Pure hypercholesterolemia, unspecified: Secondary | ICD-10-CM | POA: Diagnosis not present

## 2020-07-22 DIAGNOSIS — M8580 Other specified disorders of bone density and structure, unspecified site: Secondary | ICD-10-CM | POA: Diagnosis not present

## 2020-07-22 DIAGNOSIS — N183 Chronic kidney disease, stage 3 unspecified: Secondary | ICD-10-CM | POA: Diagnosis not present

## 2020-08-17 ENCOUNTER — Other Ambulatory Visit: Payer: Self-pay | Admitting: Orthopedic Surgery

## 2020-08-17 DIAGNOSIS — M7061 Trochanteric bursitis, right hip: Secondary | ICD-10-CM | POA: Diagnosis not present

## 2020-08-17 DIAGNOSIS — R159 Full incontinence of feces: Secondary | ICD-10-CM | POA: Diagnosis not present

## 2020-08-17 DIAGNOSIS — M25559 Pain in unspecified hip: Secondary | ICD-10-CM

## 2020-08-17 DIAGNOSIS — M5416 Radiculopathy, lumbar region: Secondary | ICD-10-CM | POA: Diagnosis not present

## 2020-08-17 DIAGNOSIS — E119 Type 2 diabetes mellitus without complications: Secondary | ICD-10-CM | POA: Diagnosis not present

## 2020-08-17 DIAGNOSIS — R634 Abnormal weight loss: Secondary | ICD-10-CM | POA: Diagnosis not present

## 2020-08-17 DIAGNOSIS — E538 Deficiency of other specified B group vitamins: Secondary | ICD-10-CM | POA: Diagnosis not present

## 2020-08-17 DIAGNOSIS — E7800 Pure hypercholesterolemia, unspecified: Secondary | ICD-10-CM | POA: Diagnosis not present

## 2020-08-17 DIAGNOSIS — I10 Essential (primary) hypertension: Secondary | ICD-10-CM | POA: Diagnosis not present

## 2020-08-17 DIAGNOSIS — E039 Hypothyroidism, unspecified: Secondary | ICD-10-CM | POA: Diagnosis not present

## 2020-08-17 DIAGNOSIS — Q822 Mastocytosis: Secondary | ICD-10-CM | POA: Diagnosis not present

## 2020-08-17 DIAGNOSIS — M353 Polymyalgia rheumatica: Secondary | ICD-10-CM | POA: Diagnosis not present

## 2020-08-17 DIAGNOSIS — M7062 Trochanteric bursitis, left hip: Secondary | ICD-10-CM | POA: Diagnosis not present

## 2020-08-17 DIAGNOSIS — F5109 Other insomnia not due to a substance or known physiological condition: Secondary | ICD-10-CM | POA: Diagnosis not present

## 2020-08-17 DIAGNOSIS — Z79899 Other long term (current) drug therapy: Secondary | ICD-10-CM | POA: Diagnosis not present

## 2020-08-17 DIAGNOSIS — M545 Low back pain, unspecified: Secondary | ICD-10-CM

## 2020-08-17 DIAGNOSIS — R195 Other fecal abnormalities: Secondary | ICD-10-CM | POA: Diagnosis not present

## 2020-08-18 DIAGNOSIS — H0236 Blepharochalasis left eye, unspecified eyelid: Secondary | ICD-10-CM | POA: Diagnosis not present

## 2020-08-18 DIAGNOSIS — E119 Type 2 diabetes mellitus without complications: Secondary | ICD-10-CM | POA: Diagnosis not present

## 2020-08-18 DIAGNOSIS — M48061 Spinal stenosis, lumbar region without neurogenic claudication: Secondary | ICD-10-CM | POA: Diagnosis not present

## 2020-08-18 DIAGNOSIS — Z961 Presence of intraocular lens: Secondary | ICD-10-CM | POA: Diagnosis not present

## 2020-08-18 DIAGNOSIS — H0233 Blepharochalasis right eye, unspecified eyelid: Secondary | ICD-10-CM | POA: Diagnosis not present

## 2020-08-18 DIAGNOSIS — M19072 Primary osteoarthritis, left ankle and foot: Secondary | ICD-10-CM | POA: Diagnosis not present

## 2020-08-24 DIAGNOSIS — I10 Essential (primary) hypertension: Secondary | ICD-10-CM | POA: Diagnosis not present

## 2020-08-24 DIAGNOSIS — E7800 Pure hypercholesterolemia, unspecified: Secondary | ICD-10-CM | POA: Diagnosis not present

## 2020-08-24 DIAGNOSIS — M8580 Other specified disorders of bone density and structure, unspecified site: Secondary | ICD-10-CM | POA: Diagnosis not present

## 2020-08-24 DIAGNOSIS — E039 Hypothyroidism, unspecified: Secondary | ICD-10-CM | POA: Diagnosis not present

## 2020-08-24 DIAGNOSIS — E782 Mixed hyperlipidemia: Secondary | ICD-10-CM | POA: Diagnosis not present

## 2020-08-24 DIAGNOSIS — D649 Anemia, unspecified: Secondary | ICD-10-CM | POA: Diagnosis not present

## 2020-08-24 DIAGNOSIS — N183 Chronic kidney disease, stage 3 unspecified: Secondary | ICD-10-CM | POA: Diagnosis not present

## 2020-08-24 DIAGNOSIS — E119 Type 2 diabetes mellitus without complications: Secondary | ICD-10-CM | POA: Diagnosis not present

## 2020-08-24 DIAGNOSIS — G43009 Migraine without aura, not intractable, without status migrainosus: Secondary | ICD-10-CM | POA: Diagnosis not present

## 2020-08-24 NOTE — Progress Notes (Signed)
GYNECOLOGY  VISIT   HPI: 70 y.o.   Married  Caucasian  female   G2P2 with No LMP recorded (lmp unknown). Patient has had a hysterectomy.   here for breast and pelvic exam.   Had an AEX with PCP this year.   Off Prednisone for less than one week and really pleased with this.   Had cataract surgery so she does not need glasses now.  Having arthritis and this limits her physical activity.   Hot flashes are gone.  Feeling more cold.   No problems with bladder or bowel function.  Maybe has some constipation.   Not sexually active currently.   Dr. Layne Benton is following her bone density.  Done in the last 2 months.  Taking Prolia.   Would like Rx for scopalamine patch for nausea.  Taking a trip on a yacht.    Mom passed 10/2019. Son having a baby in July in [place].   GYNECOLOGIC HISTORY: No LMP recorded (lmp unknown). Patient has had a hysterectomy. Contraception:  Hyst Menopausal hormone therapy:  None Last mammogram: 03-10-20 3D/Neg/BiRads1 Last pap smear:  09-07-16 Neg, 09-30-15 Neg,06-23-14 Neg:Neg HR HPV        OB History    Gravida  2   Para  2   Term      Preterm      AB      Living  2     SAB      IAB      Ectopic      Multiple      Live Births                 Patient Active Problem List   Diagnosis Date Noted  . Murmur 04/14/2017  . Postmenopausal HRT (hormone replacement therapy) 10/29/2014  . Hot flashes, menopausal 10/29/2014  . Fitting and adjustment of gastric lap band 01/14/2013  . Obese 01/14/2013  . H/O vitamin D deficiency 08/09/2012  . Unspecified hypothyroidism 08/09/2012  . Menopause 08/09/2012  . Postoperative seroma-in ventral hernia repair site 07/20/2011  . Lapband APS April 2010 07/20/2011  . Hypertension   . Mast cell disorder   . DEGENERATIVE JOINT DISEASE 02/19/2008  . SPONDYLOSIS UNSPEC SITE W/O MENTION MYELOPATHY 02/19/2008  . SINUS TARSI SYNDROME 02/19/2008  . UNEQUAL LEG LENGTH 02/19/2008    Past Medical  History:  Diagnosis Date  . Abnormal Pap smear of cervix    Hx of cryotherapy to cervix in her 81s  . Abnormal uterine bleeding    history of fibroids  . Anemia   . Chronic kidney disease    stage 2  . Diabetes mellitus without complication ([place])    type 2 diagnosed 3 years ago  . DJD (degenerative joint disease)   . Fibroid   . Heart murmur    functional  . History of hypertension NO MEDS SINCE LAP GASTRIC BAND 2010 --  WT. LOSS  . Hypertension   . Hypothyroidism   . Low testosterone level in female 2019  . Low testosterone level in female   . 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  STATES SHE CARRIES EPI PEN--BUT HAS NOT HAD ANY RECENT EPISODES.  STATES MANY OF THE MEDICATIONS SHE TAKES ARE TO HELP THIS PROBLEM - INCLUDING  Ketotifen-mast cell inhibitor-not approved in [place].   . Migraine   . 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 Osteopenia   . Other fracture of sacrum, initial encounter for closed fracture ([facility] [facility]) 06/2017  . PMR (polymyalgia rheumatica) ([facility])   . PONV (postoperative nausea and vomiting)   . Spondylosis     Past Surgical History:  Procedure Laterality Date  . ABDOMINAL HYSTERECTOMY  02/20/2011   Procedure: HYSTERECTOMY ABDOMINAL;  Surgeon: Bennetta Laos, MD;  Location: [facility];  Service: Gynecology;  Laterality: N/A;  . BREAST BIOPSY Right 05/02/2013   [facility]  . CATARACT EXTRACTION, BILATERAL Bilateral 10/2019  . CERVICAL BIOPSY  W/ LOOP ELECTRODE EXCISION  1992  . CERVIX LESION DESTRUCTION    . [place]   X2  . CHOLECYSTECTOMY    . COLONOSCOPY    . DILATION AND CURETTAGE OF UTERUS     heavy menses  . HYSTEROSCOPY  06/1995   HYSTEROSCOPIC MYOMECTOMY  . HYSTEROSCOPY  06/1998   D&C, HYSTEROSCOPY  . KNEE SURGERY     X2 right knee  . LAPAROSCOPIC GASTRIC BANDING  08-03-2008   [facility] APS SYSTEM  . LESION  REMOVAL N/A 09/02/2012   Procedure: EXCISION VAGINAL LESION;  Surgeon: Terrance Mass, MD;  Location: [facility];  Service: Gynecology;  Laterality: N/A;  cpt 57100  one hour  . PUBOVAGINAL SLING  02/20/2011   Procedure: Gaynelle Arabian;  Surgeon: Ailene Rud, MD;  Location: [facility];  Service: Urology;  Laterality: N/A;  . RECTOCELE REPAIR N/A 12/19/2016   Procedure: POSTERIOR REPAIR (RECTOCELE);  Surgeon: Nunzio Cobbs, MD;  Location: [facility];  Service: Gynecology;  Laterality: N/A;  1.25 hours  . SALPINGOOPHORECTOMY  02/20/2011   Procedure: SALPINGO OOPHERECTOMY;  Surgeon: Bennetta Laos, MD;  Location: [facility];  Service: Gynecology;  Laterality: Bilateral;  . UPPER GASTROINTESTINAL ENDOSCOPY  06/2011  . VENTRAL HERNIA REPAIR  05/24/2011   Procedure: LAPAROSCOPIC VENTRAL HERNIA;  Surgeon: Pedro Earls, MD;  Location: [facility];  Service: General;  Laterality: N/A;  . WOUND EXPLORATION Left 02/22/2018   Procedure: EXPLORATION OF MASS  BEHIND LEFT EAR;  Surgeon: Johnathan Hausen, MD;  Location: [facility];  Service: General;  Laterality: Left;    Current Outpatient Medications  Medication Sig Dispense Refill  . Calcium Citrate-Vitamin D 315-250 MG-UNIT TABS Take twice daily    . cetirizine (ZYRTEC) 10 MG tablet Take 10 mg by mouth daily.    . Cholecalciferol (VITAMIN D3) 50 MCG (2000 UT) capsule 1 capsule    . clonazePAM (KLONOPIN) 1 MG tablet TAKE THREE TABLETS BY MOUTH EVERYDAY AT BEDTIME    . clonazePAM (KLONOPIN) 2 MG tablet Take by mouth.    . denosumab (PROLIA) 60 MG/ML SOSY injection Inject 60 mg into the skin every 6 (six) months.    . EPINEPHrine (EPI-PEN) 0.3 mg/0.3 mL DEVI Inject into the muscle as needed.    . famotidine (PEPCID) 20 MG tablet 1 tablet at bedtime as needed    . finasteride (PROSCAR) 5 MG tablet 1/2 tablet    . hydrOXYzine (ATARAX/VISTARIL) 50 MG tablet Take 1 tablet by mouth 2 (two) times daily.    .  hyoscyamine (LEVSIN SL) 0.125 MG SL tablet Place 1 tablet under the tongue as needed.    Marland Kitchen losartan (COZAAR) 50 MG tablet 1 tablet    . montelukast (SINGULAIR) 10 MG tablet Take 10 mg by mouth at bedtime.    . ondansetron (ZOFRAN-ODT) 4 MG disintegrating tablet Take 1 tablet by mouth as needed.    Marland Kitchen  PRESCRIPTION MEDICATION Take 1 tablet by mouth 2 (two) times daily. Ketotifen 5mg  tablet[place]. For mast cell problem    . rizatriptan (MAXALT) 10 MG tablet     . rosuvastatin (CRESTOR) 20 MG tablet TAKE ONE TABLET BY MOUTH EVERYDAY AT BEDTIME    . SYNTHROID 100 MCG tablet     . topiramate (TOPAMAX) 50 MG tablet Take 50 mg by mouth 2 (two) times daily.    Marland Kitchen UNABLE TO FIND 2 capsules 2 (two) times daily. Med Name: AlgaeCal Plus    . VASCEPA 1 g CAPS Take 1 capsule by mouth 2 (two) times daily.     No current facility-administered medications for this visit.     ALLERGIES: Lisinopril and Demerol  Family History  Problem Relation Age of Onset  . Breast cancer Mother        47's  . Hypertension Mother   . Thyroid disease Mother        hypothyroid  . Cancer Father        LUNG  . Thyroid disease Maternal Grandmother        hypothyroid    Social History   Socioeconomic History  . Marital status: Married    Spouse name: Not on file  . Number of children: Not on file  . Years of education: Not on file  . Highest education level: Not on file  Occupational History  . Not on file  Tobacco Use  . Smoking status: Never Smoker  . Smokeless tobacco: Never Used  Vaping Use  . Vaping Use: Never used  Substance and Sexual Activity  . Alcohol use: No    Alcohol/week: 0.0 standard drinks    Comment: social  . Drug use: No  . Sexual activity: Not Currently    Partners: Male    Birth control/protection: Surgical    Comment: TAH/BSO  Other Topics Concern  . Not on file  Social History Narrative   Lives with husband   Social Determinants of Health   Financial  Resource Strain: Not on file  Food Insecurity: Not on file  Transportation Needs: Not on file  Physical Activity: Not on file  Stress: Not on file  Social Connections: Not on file  Intimate Partner Violence: Not on file    Review of Systems  All other systems reviewed and are negative.   PHYSICAL EXAMINATION:    BP 120/78   Pulse 63   Ht 5\' 3"  (1.6 m)   Wt 165 lb (74.8 kg)   LMP  (LMP Unknown)   SpO2 98%   BMI 29.23 kg/m     General appearance: alert, cooperative and appears stated age Lungs: clear to auscultation bilaterally Breasts: normal appearance, no masses or tenderness, No nipple retraction or dimpling, No nipple discharge or bleeding, No axillary adenopathy Heart: regular rate and rhythm Abdomen: soft, non-tender, 3 cm right abdominal subcutaneous mass consistent with gastric port,  no organomegaly Extremities: extremities normal, atraumatic, no cyanosis or edema No abnormal inguinal nodes palpated Neurologic: Grossly normal  Pelvic: External genitalia:  no lesions              Urethra:  normal appearing urethra with no masses, tenderness or lesions              Bartholins and Skenes: normal                 Vagina: normal appearing vagina with normal color and discharge, no lesions  Cervix: absent                Bimanual Exam:  Uterus:  absent              Adnexa: no mass, fullness, tenderness              Rectal exam: Yes.  .  Confirms.              Anus:  normal sphincter tone, no lesions  Chaperone was present for exam.  ASSESSMENT  Screening breast exam. Pelvic exam without abnormal findings. Status post total abdominal hysterectomy for fibroids in 2012.  Final cervical pathology benign.   Status post BSO.  Status post midurethral sling. Status post rectocelerepair. Bilateral sacral fracture 06/2017. Hx of abnormal paps and cryo, LEEP, and conization of cervix. Diabetes resolved off prednisone.  Osteoporosis.  Treating with Prolia.   Chronic kidney disease. Encounter for new medication Rx.  PLAN  Pap not needed.   Yearly mammogram recommended.  Self breast exam encouraged.  Will get a copy of bone density.  Rx for scolpalamine.  FU in 2 years for breast and pelvic exam and prn for any problems or concerns.  30 min  total time was spent for this patient encounter, including preparation, face-to-face counseling with the patient, coordination of care, and documentation of the encounter.

## 2020-08-25 ENCOUNTER — Ambulatory Visit (INDEPENDENT_AMBULATORY_CARE_PROVIDER_SITE_OTHER): Payer: Medicare Other | Admitting: Obstetrics and Gynecology

## 2020-08-25 ENCOUNTER — Telehealth: Payer: Self-pay | Admitting: Obstetrics and Gynecology

## 2020-08-25 ENCOUNTER — Encounter: Payer: Self-pay | Admitting: Obstetrics and Gynecology

## 2020-08-25 ENCOUNTER — Other Ambulatory Visit: Payer: Self-pay

## 2020-08-25 VITALS — BP 120/78 | HR 63 | Ht 63.0 in | Wt 165.0 lb

## 2020-08-25 DIAGNOSIS — Z1239 Encounter for other screening for malignant neoplasm of breast: Secondary | ICD-10-CM

## 2020-08-25 DIAGNOSIS — Z01419 Encounter for gynecological examination (general) (routine) without abnormal findings: Secondary | ICD-10-CM

## 2020-08-25 DIAGNOSIS — M25572 Pain in left ankle and joints of left foot: Secondary | ICD-10-CM | POA: Diagnosis not present

## 2020-08-25 DIAGNOSIS — Z7689 Persons encountering health services in other specified circumstances: Secondary | ICD-10-CM | POA: Diagnosis not present

## 2020-08-25 MED ORDER — SCOPOLAMINE 1 MG/3DAYS TD PT72: 1.0000 | MEDICATED_PATCH | TRANSDERMAL | 0 refills | Status: DC

## 2020-08-25 NOTE — Patient Instructions (Signed)

## 2020-08-25 NOTE — Telephone Encounter (Signed)
Please obtain a copy of bone density from [facility] for patient done in 2022.

## 2020-08-31 ENCOUNTER — Other Ambulatory Visit: Payer: Self-pay

## 2020-08-31 ENCOUNTER — Ambulatory Visit
Admission: RE | Admit: 2020-08-31 | Discharge: 2020-08-31 | Disposition: A | Discharge status: Home / Self Care | Payer: Medicare Other | Source: Ambulatory Visit | Attending: Orthopedic Surgery | Admitting: Orthopedic Surgery

## 2020-08-31 DIAGNOSIS — M25559 Pain in unspecified hip: Secondary | ICD-10-CM

## 2020-08-31 DIAGNOSIS — Z20828 Contact with and (suspected) exposure to other viral communicable diseases: Secondary | ICD-10-CM | POA: Diagnosis not present

## 2020-08-31 DIAGNOSIS — M25551 Pain in right hip: Secondary | ICD-10-CM | POA: Diagnosis not present

## 2020-08-31 DIAGNOSIS — M545 Low back pain, unspecified: Secondary | ICD-10-CM

## 2020-08-31 DIAGNOSIS — S76312A Strain of muscle, fascia and tendon of the posterior muscle group at thigh level, left thigh, initial encounter: Secondary | ICD-10-CM | POA: Diagnosis not present

## 2020-08-31 DIAGNOSIS — M25552 Pain in left hip: Secondary | ICD-10-CM | POA: Diagnosis not present

## 2020-08-31 DIAGNOSIS — S46011A Strain of muscle(s) and tendon(s) of the rotator cuff of right shoulder, initial encounter: Secondary | ICD-10-CM | POA: Diagnosis not present

## 2020-08-31 DIAGNOSIS — K5730 Diverticulosis of large intestine without perforation or abscess without bleeding: Secondary | ICD-10-CM | POA: Diagnosis not present

## 2020-09-02 DIAGNOSIS — M48061 Spinal stenosis, lumbar region without neurogenic claudication: Secondary | ICD-10-CM | POA: Diagnosis not present

## 2020-09-02 DIAGNOSIS — M5416 Radiculopathy, lumbar region: Secondary | ICD-10-CM | POA: Diagnosis not present

## 2020-09-09 DIAGNOSIS — D649 Anemia, unspecified: Secondary | ICD-10-CM | POA: Diagnosis not present

## 2020-09-09 DIAGNOSIS — I10 Essential (primary) hypertension: Secondary | ICD-10-CM | POA: Diagnosis not present

## 2020-09-09 DIAGNOSIS — M8580 Other specified disorders of bone density and structure, unspecified site: Secondary | ICD-10-CM | POA: Diagnosis not present

## 2020-09-09 DIAGNOSIS — N183 Chronic kidney disease, stage 3 unspecified: Secondary | ICD-10-CM | POA: Diagnosis not present

## 2020-09-09 DIAGNOSIS — E7800 Pure hypercholesterolemia, unspecified: Secondary | ICD-10-CM | POA: Diagnosis not present

## 2020-09-09 DIAGNOSIS — E039 Hypothyroidism, unspecified: Secondary | ICD-10-CM | POA: Diagnosis not present

## 2020-09-09 DIAGNOSIS — E119 Type 2 diabetes mellitus without complications: Secondary | ICD-10-CM | POA: Diagnosis not present

## 2020-09-09 DIAGNOSIS — G43009 Migraine without aura, not intractable, without status migrainosus: Secondary | ICD-10-CM | POA: Diagnosis not present

## 2020-09-09 DIAGNOSIS — E782 Mixed hyperlipidemia: Secondary | ICD-10-CM | POA: Diagnosis not present

## 2020-09-09 NOTE — Telephone Encounter (Signed)
Received BMD report and given to Dr.Silva.

## 2020-09-09 NOTE — Telephone Encounter (Signed)
Called [facility] and requested BMD report. They will fax.

## 2020-09-16 DIAGNOSIS — Z20822 Contact with and (suspected) exposure to covid-19: Secondary | ICD-10-CM | POA: Diagnosis not present

## 2020-09-17 DIAGNOSIS — M25552 Pain in left hip: Secondary | ICD-10-CM | POA: Diagnosis not present

## 2020-10-13 DIAGNOSIS — M79672 Pain in left foot: Secondary | ICD-10-CM | POA: Diagnosis not present

## 2020-10-13 DIAGNOSIS — M19079 Primary osteoarthritis, unspecified ankle and foot: Secondary | ICD-10-CM | POA: Diagnosis not present

## 2020-10-13 DIAGNOSIS — M79671 Pain in right foot: Secondary | ICD-10-CM | POA: Diagnosis not present

## 2020-10-14 DIAGNOSIS — J029 Acute pharyngitis, unspecified: Secondary | ICD-10-CM | POA: Diagnosis not present

## 2020-10-14 DIAGNOSIS — U071 COVID-19: Secondary | ICD-10-CM | POA: Diagnosis not present

## 2020-10-14 DIAGNOSIS — R059 Cough, unspecified: Secondary | ICD-10-CM | POA: Diagnosis not present

## 2020-10-14 DIAGNOSIS — R5383 Other fatigue: Secondary | ICD-10-CM | POA: Diagnosis not present

## 2020-10-28 DIAGNOSIS — D649 Anemia, unspecified: Secondary | ICD-10-CM | POA: Diagnosis not present

## 2020-10-28 DIAGNOSIS — I10 Essential (primary) hypertension: Secondary | ICD-10-CM | POA: Diagnosis not present

## 2020-10-28 DIAGNOSIS — E7800 Pure hypercholesterolemia, unspecified: Secondary | ICD-10-CM | POA: Diagnosis not present

## 2020-10-28 DIAGNOSIS — N183 Chronic kidney disease, stage 3 unspecified: Secondary | ICD-10-CM | POA: Diagnosis not present

## 2020-10-28 DIAGNOSIS — G43009 Migraine without aura, not intractable, without status migrainosus: Secondary | ICD-10-CM | POA: Diagnosis not present

## 2020-10-28 DIAGNOSIS — M8580 Other specified disorders of bone density and structure, unspecified site: Secondary | ICD-10-CM | POA: Diagnosis not present

## 2020-10-28 DIAGNOSIS — E119 Type 2 diabetes mellitus without complications: Secondary | ICD-10-CM | POA: Diagnosis not present

## 2020-10-28 DIAGNOSIS — E782 Mixed hyperlipidemia: Secondary | ICD-10-CM | POA: Diagnosis not present

## 2020-10-28 DIAGNOSIS — E039 Hypothyroidism, unspecified: Secondary | ICD-10-CM | POA: Diagnosis not present

## 2020-12-15 DIAGNOSIS — E119 Type 2 diabetes mellitus without complications: Secondary | ICD-10-CM | POA: Diagnosis not present

## 2020-12-15 DIAGNOSIS — E7800 Pure hypercholesterolemia, unspecified: Secondary | ICD-10-CM | POA: Diagnosis not present

## 2020-12-15 DIAGNOSIS — G43009 Migraine without aura, not intractable, without status migrainosus: Secondary | ICD-10-CM | POA: Diagnosis not present

## 2020-12-15 DIAGNOSIS — M8580 Other specified disorders of bone density and structure, unspecified site: Secondary | ICD-10-CM | POA: Diagnosis not present

## 2020-12-15 DIAGNOSIS — N183 Chronic kidney disease, stage 3 unspecified: Secondary | ICD-10-CM | POA: Diagnosis not present

## 2020-12-15 DIAGNOSIS — E039 Hypothyroidism, unspecified: Secondary | ICD-10-CM | POA: Diagnosis not present

## 2020-12-15 DIAGNOSIS — I10 Essential (primary) hypertension: Secondary | ICD-10-CM | POA: Diagnosis not present

## 2020-12-15 DIAGNOSIS — D649 Anemia, unspecified: Secondary | ICD-10-CM | POA: Diagnosis not present

## 2020-12-15 DIAGNOSIS — E782 Mixed hyperlipidemia: Secondary | ICD-10-CM | POA: Diagnosis not present

## 2020-12-29 DIAGNOSIS — M810 Age-related osteoporosis without current pathological fracture: Secondary | ICD-10-CM | POA: Diagnosis not present

## 2020-12-29 DIAGNOSIS — R5383 Other fatigue: Secondary | ICD-10-CM | POA: Diagnosis not present

## 2020-12-29 DIAGNOSIS — E559 Vitamin D deficiency, unspecified: Secondary | ICD-10-CM | POA: Diagnosis not present

## 2020-12-30 DIAGNOSIS — R768 Other specified abnormal immunological findings in serum: Secondary | ICD-10-CM | POA: Diagnosis not present

## 2020-12-30 DIAGNOSIS — E038 Other specified hypothyroidism: Secondary | ICD-10-CM | POA: Diagnosis not present

## 2020-12-30 DIAGNOSIS — D649 Anemia, unspecified: Secondary | ICD-10-CM | POA: Diagnosis not present

## 2021-01-06 DIAGNOSIS — M810 Age-related osteoporosis without current pathological fracture: Secondary | ICD-10-CM | POA: Diagnosis not present

## 2021-01-06 DIAGNOSIS — E559 Vitamin D deficiency, unspecified: Secondary | ICD-10-CM | POA: Diagnosis not present

## 2021-01-07 DIAGNOSIS — M8588 Other specified disorders of bone density and structure, other site: Secondary | ICD-10-CM | POA: Diagnosis not present

## 2021-01-07 DIAGNOSIS — E039 Hypothyroidism, unspecified: Secondary | ICD-10-CM | POA: Diagnosis not present

## 2021-01-07 DIAGNOSIS — D47Z9 Other specified neoplasms of uncertain behavior of lymphoid, hematopoietic and related tissue: Secondary | ICD-10-CM | POA: Diagnosis not present

## 2021-01-07 DIAGNOSIS — I129 Hypertensive chronic kidney disease with stage 1 through stage 4 chronic kidney disease, or unspecified chronic kidney disease: Secondary | ICD-10-CM | POA: Diagnosis not present

## 2021-01-07 DIAGNOSIS — I712 Thoracic aortic aneurysm, without rupture: Secondary | ICD-10-CM | POA: Diagnosis not present

## 2021-01-07 DIAGNOSIS — N1832 Chronic kidney disease, stage 3b: Secondary | ICD-10-CM | POA: Diagnosis not present

## 2021-01-07 DIAGNOSIS — D8940 Mast cell activation, unspecified: Secondary | ICD-10-CM | POA: Diagnosis not present

## 2021-01-07 DIAGNOSIS — E785 Hyperlipidemia, unspecified: Secondary | ICD-10-CM | POA: Diagnosis not present

## 2021-01-07 DIAGNOSIS — E1129 Type 2 diabetes mellitus with other diabetic kidney complication: Secondary | ICD-10-CM | POA: Diagnosis not present

## 2021-01-07 DIAGNOSIS — M353 Polymyalgia rheumatica: Secondary | ICD-10-CM | POA: Diagnosis not present

## 2021-01-08 DIAGNOSIS — Z23 Encounter for immunization: Secondary | ICD-10-CM | POA: Diagnosis not present

## 2021-01-13 ENCOUNTER — Ambulatory Visit (INDEPENDENT_AMBULATORY_CARE_PROVIDER_SITE_OTHER): Payer: Medicare Other | Admitting: Psychology

## 2021-01-13 DIAGNOSIS — F4320 Adjustment disorder, unspecified: Secondary | ICD-10-CM | POA: Diagnosis not present

## 2021-01-14 DIAGNOSIS — E039 Hypothyroidism, unspecified: Secondary | ICD-10-CM | POA: Diagnosis not present

## 2021-01-14 DIAGNOSIS — G43009 Migraine without aura, not intractable, without status migrainosus: Secondary | ICD-10-CM | POA: Diagnosis not present

## 2021-01-14 DIAGNOSIS — E119 Type 2 diabetes mellitus without complications: Secondary | ICD-10-CM | POA: Diagnosis not present

## 2021-01-14 DIAGNOSIS — E782 Mixed hyperlipidemia: Secondary | ICD-10-CM | POA: Diagnosis not present

## 2021-01-14 DIAGNOSIS — D649 Anemia, unspecified: Secondary | ICD-10-CM | POA: Diagnosis not present

## 2021-01-14 DIAGNOSIS — I10 Essential (primary) hypertension: Secondary | ICD-10-CM | POA: Diagnosis not present

## 2021-01-14 DIAGNOSIS — M8580 Other specified disorders of bone density and structure, unspecified site: Secondary | ICD-10-CM | POA: Diagnosis not present

## 2021-01-14 DIAGNOSIS — E7800 Pure hypercholesterolemia, unspecified: Secondary | ICD-10-CM | POA: Diagnosis not present

## 2021-01-14 DIAGNOSIS — N183 Chronic kidney disease, stage 3 unspecified: Secondary | ICD-10-CM | POA: Diagnosis not present

## 2021-01-14 DIAGNOSIS — Z23 Encounter for immunization: Secondary | ICD-10-CM | POA: Diagnosis not present

## 2021-01-26 ENCOUNTER — Ambulatory Visit: Payer: Medicare Other | Admitting: Obstetrics and Gynecology

## 2021-01-26 DIAGNOSIS — D2271 Melanocytic nevi of right lower limb, including hip: Secondary | ICD-10-CM | POA: Diagnosis not present

## 2021-01-26 DIAGNOSIS — L820 Inflamed seborrheic keratosis: Secondary | ICD-10-CM | POA: Diagnosis not present

## 2021-01-26 DIAGNOSIS — D2262 Melanocytic nevi of left upper limb, including shoulder: Secondary | ICD-10-CM | POA: Diagnosis not present

## 2021-01-26 DIAGNOSIS — D1801 Hemangioma of skin and subcutaneous tissue: Secondary | ICD-10-CM | POA: Diagnosis not present

## 2021-01-26 DIAGNOSIS — D2261 Melanocytic nevi of right upper limb, including shoulder: Secondary | ICD-10-CM | POA: Diagnosis not present

## 2021-01-26 DIAGNOSIS — L821 Other seborrheic keratosis: Secondary | ICD-10-CM | POA: Diagnosis not present

## 2021-01-26 DIAGNOSIS — L814 Other melanin hyperpigmentation: Secondary | ICD-10-CM | POA: Diagnosis not present

## 2021-01-26 DIAGNOSIS — H61002 Unspecified perichondritis of left external ear: Secondary | ICD-10-CM | POA: Diagnosis not present

## 2021-01-26 DIAGNOSIS — D2272 Melanocytic nevi of left lower limb, including hip: Secondary | ICD-10-CM | POA: Diagnosis not present

## 2021-02-07 DIAGNOSIS — R5383 Other fatigue: Secondary | ICD-10-CM | POA: Diagnosis not present

## 2021-02-07 DIAGNOSIS — E039 Hypothyroidism, unspecified: Secondary | ICD-10-CM | POA: Diagnosis not present

## 2021-02-07 DIAGNOSIS — R739 Hyperglycemia, unspecified: Secondary | ICD-10-CM | POA: Diagnosis not present

## 2021-02-07 DIAGNOSIS — R7309 Other abnormal glucose: Secondary | ICD-10-CM | POA: Diagnosis not present

## 2021-02-07 DIAGNOSIS — R6889 Other general symptoms and signs: Secondary | ICD-10-CM | POA: Diagnosis not present

## 2021-02-10 DIAGNOSIS — E039 Hypothyroidism, unspecified: Secondary | ICD-10-CM | POA: Diagnosis not present

## 2021-02-10 DIAGNOSIS — M8580 Other specified disorders of bone density and structure, unspecified site: Secondary | ICD-10-CM | POA: Diagnosis not present

## 2021-02-10 DIAGNOSIS — G43009 Migraine without aura, not intractable, without status migrainosus: Secondary | ICD-10-CM | POA: Diagnosis not present

## 2021-02-10 DIAGNOSIS — F341 Dysthymic disorder: Secondary | ICD-10-CM | POA: Diagnosis not present

## 2021-02-10 DIAGNOSIS — E7800 Pure hypercholesterolemia, unspecified: Secondary | ICD-10-CM | POA: Diagnosis not present

## 2021-02-10 DIAGNOSIS — N183 Chronic kidney disease, stage 3 unspecified: Secondary | ICD-10-CM | POA: Diagnosis not present

## 2021-02-10 DIAGNOSIS — I10 Essential (primary) hypertension: Secondary | ICD-10-CM | POA: Diagnosis not present

## 2021-02-10 DIAGNOSIS — E782 Mixed hyperlipidemia: Secondary | ICD-10-CM | POA: Diagnosis not present

## 2021-02-16 DIAGNOSIS — D8940 Mast cell activation, unspecified: Secondary | ICD-10-CM | POA: Diagnosis not present

## 2021-02-16 DIAGNOSIS — E1129 Type 2 diabetes mellitus with other diabetic kidney complication: Secondary | ICD-10-CM | POA: Diagnosis not present

## 2021-02-16 DIAGNOSIS — E785 Hyperlipidemia, unspecified: Secondary | ICD-10-CM | POA: Diagnosis not present

## 2021-02-16 DIAGNOSIS — I129 Hypertensive chronic kidney disease with stage 1 through stage 4 chronic kidney disease, or unspecified chronic kidney disease: Secondary | ICD-10-CM | POA: Diagnosis not present

## 2021-02-16 DIAGNOSIS — N1832 Chronic kidney disease, stage 3b: Secondary | ICD-10-CM | POA: Diagnosis not present

## 2021-02-16 DIAGNOSIS — E039 Hypothyroidism, unspecified: Secondary | ICD-10-CM | POA: Diagnosis not present

## 2021-02-16 DIAGNOSIS — Z9884 Bariatric surgery status: Secondary | ICD-10-CM | POA: Diagnosis not present

## 2021-03-03 DIAGNOSIS — R6889 Other general symptoms and signs: Secondary | ICD-10-CM | POA: Diagnosis not present

## 2021-03-03 DIAGNOSIS — E039 Hypothyroidism, unspecified: Secondary | ICD-10-CM | POA: Diagnosis not present

## 2021-03-03 DIAGNOSIS — R634 Abnormal weight loss: Secondary | ICD-10-CM | POA: Diagnosis not present

## 2021-03-03 DIAGNOSIS — R739 Hyperglycemia, unspecified: Secondary | ICD-10-CM | POA: Diagnosis not present

## 2021-03-03 DIAGNOSIS — F341 Dysthymic disorder: Secondary | ICD-10-CM | POA: Diagnosis not present

## 2021-03-03 DIAGNOSIS — R5383 Other fatigue: Secondary | ICD-10-CM | POA: Diagnosis not present

## 2021-03-07 ENCOUNTER — Other Ambulatory Visit: Payer: Self-pay | Admitting: Orthopedic Surgery

## 2021-03-07 DIAGNOSIS — M19279 Secondary osteoarthritis, unspecified ankle and foot: Secondary | ICD-10-CM

## 2021-03-07 DIAGNOSIS — M1990 Unspecified osteoarthritis, unspecified site: Secondary | ICD-10-CM

## 2021-03-14 DIAGNOSIS — M5416 Radiculopathy, lumbar region: Secondary | ICD-10-CM | POA: Diagnosis not present

## 2021-03-14 DIAGNOSIS — M7062 Trochanteric bursitis, left hip: Secondary | ICD-10-CM | POA: Diagnosis not present

## 2021-03-15 ENCOUNTER — Other Ambulatory Visit: Payer: Self-pay

## 2021-03-15 ENCOUNTER — Ambulatory Visit
Admission: RE | Admit: 2021-03-15 | Discharge: 2021-03-15 | Disposition: A | Discharge status: Home / Self Care | Payer: Medicare Other | Source: Ambulatory Visit | Attending: Orthopedic Surgery | Admitting: Orthopedic Surgery

## 2021-03-15 DIAGNOSIS — M19072 Primary osteoarthritis, left ankle and foot: Secondary | ICD-10-CM | POA: Diagnosis not present

## 2021-03-15 DIAGNOSIS — M1990 Unspecified osteoarthritis, unspecified site: Secondary | ICD-10-CM

## 2021-03-15 DIAGNOSIS — S86312A Strain of muscle(s) and tendon(s) of peroneal muscle group at lower leg level, left leg, initial encounter: Secondary | ICD-10-CM | POA: Diagnosis not present

## 2021-03-16 DIAGNOSIS — E039 Hypothyroidism, unspecified: Secondary | ICD-10-CM | POA: Diagnosis not present

## 2021-03-16 DIAGNOSIS — E782 Mixed hyperlipidemia: Secondary | ICD-10-CM | POA: Diagnosis not present

## 2021-03-16 DIAGNOSIS — E7800 Pure hypercholesterolemia, unspecified: Secondary | ICD-10-CM | POA: Diagnosis not present

## 2021-03-16 DIAGNOSIS — G43009 Migraine without aura, not intractable, without status migrainosus: Secondary | ICD-10-CM | POA: Diagnosis not present

## 2021-03-16 DIAGNOSIS — M8580 Other specified disorders of bone density and structure, unspecified site: Secondary | ICD-10-CM | POA: Diagnosis not present

## 2021-03-16 DIAGNOSIS — N183 Chronic kidney disease, stage 3 unspecified: Secondary | ICD-10-CM | POA: Diagnosis not present

## 2021-03-16 DIAGNOSIS — I10 Essential (primary) hypertension: Secondary | ICD-10-CM | POA: Diagnosis not present

## 2021-03-16 DIAGNOSIS — F341 Dysthymic disorder: Secondary | ICD-10-CM | POA: Diagnosis not present

## 2021-03-18 DIAGNOSIS — M5416 Radiculopathy, lumbar region: Secondary | ICD-10-CM | POA: Diagnosis not present

## 2021-03-20 ENCOUNTER — Other Ambulatory Visit: Payer: Medicare Other

## 2021-03-31 ENCOUNTER — Other Ambulatory Visit: Payer: Self-pay | Admitting: Obstetrics and Gynecology

## 2021-03-31 DIAGNOSIS — Z1231 Encounter for screening mammogram for malignant neoplasm of breast: Secondary | ICD-10-CM

## 2021-04-04 ENCOUNTER — Ambulatory Visit
Admission: RE | Admit: 2021-04-04 | Discharge: 2021-04-04 | Disposition: A | Discharge status: Home / Self Care | Payer: Medicare Other | Source: Ambulatory Visit | Attending: Obstetrics and Gynecology | Admitting: Obstetrics and Gynecology

## 2021-04-04 ENCOUNTER — Other Ambulatory Visit: Payer: Self-pay

## 2021-04-04 DIAGNOSIS — Z1231 Encounter for screening mammogram for malignant neoplasm of breast: Secondary | ICD-10-CM | POA: Diagnosis not present

## 2021-04-05 ENCOUNTER — Ambulatory Visit (INDEPENDENT_AMBULATORY_CARE_PROVIDER_SITE_OTHER): Payer: Medicare Other | Admitting: Psychology

## 2021-04-05 ENCOUNTER — Encounter: Payer: Self-pay | Admitting: Psychology

## 2021-04-05 DIAGNOSIS — F4323 Adjustment disorder with mixed anxiety and depressed mood: Secondary | ICD-10-CM

## 2021-04-05 NOTE — Progress Notes (Signed)
Progress Note Date 04/05/2021  Diagnosis 34.89 Symptoms Anxiety/depression Medication Status na  Safety none  If Suicidal or Homicidal State Action Taken: unspecified  Current Risk: low Medications none Objectives unspecified Client Response full compliance  Service Location Location, [address] [place], [place] [address]  Service Code cpt [phone]  Self-monitoring  Self care activities  Distress tolerance skill  Validate/empathize  Rationally challenge thoughts or beliefs/cognitive restructuring  Facilitate problem solving  Comments  Patient agrees to have a Webex session due to the pandemic. she is at home and I am in my home office. Dx.: Adjustment Disorder Goals: Patient reports she is seeking counseling to facilitate complicated family dynamics. She is also contending with anger and frustration associated with current family dynamics. Goal date is 2-23.  Gabrielle Lynch states that it has been very positive having her son and his family in town. She was frustrated that her husband had planned to leave town and play golf for part of the visit. He told her that "he will understand". He did change plans, but only after Gabrielle Lynch challenged his decision to leave. She says that her son and his wife "are kind of insular". She says that her son and wife have few friends and a small social life. She also feels they are in [place] to stay  and give no indication of coming home. They have set up dates for visits in both directions. Gabrielle Lynch says that two people have recently asked her if she was okay. She says she has not been her "usual jovial self".  We talked about her depressive feelings and the use of medication. She gor a prescription for Wellbutrin from her Primary Care physician for 150mg . She agrees to start it after our discussion. We will meet again to talk about her circumstances and reaction to depression.  04/05/2021 Gabrielle Lynch

## 2021-04-07 DIAGNOSIS — M19072 Primary osteoarthritis, left ankle and foot: Secondary | ICD-10-CM | POA: Diagnosis not present

## 2021-04-07 DIAGNOSIS — M5416 Radiculopathy, lumbar region: Secondary | ICD-10-CM | POA: Diagnosis not present

## 2021-04-11 DIAGNOSIS — E039 Hypothyroidism, unspecified: Secondary | ICD-10-CM | POA: Diagnosis not present

## 2021-04-12 DIAGNOSIS — E039 Hypothyroidism, unspecified: Secondary | ICD-10-CM | POA: Diagnosis not present

## 2021-04-12 DIAGNOSIS — R5383 Other fatigue: Secondary | ICD-10-CM | POA: Diagnosis not present

## 2021-04-12 DIAGNOSIS — G43009 Migraine without aura, not intractable, without status migrainosus: Secondary | ICD-10-CM | POA: Diagnosis not present

## 2021-04-12 DIAGNOSIS — D8949 Other mast cell activation disorder: Secondary | ICD-10-CM | POA: Diagnosis not present

## 2021-04-12 DIAGNOSIS — E038 Other specified hypothyroidism: Secondary | ICD-10-CM | POA: Diagnosis not present

## 2021-04-12 DIAGNOSIS — K582 Mixed irritable bowel syndrome: Secondary | ICD-10-CM | POA: Diagnosis not present

## 2021-04-12 DIAGNOSIS — M8580 Other specified disorders of bone density and structure, unspecified site: Secondary | ICD-10-CM | POA: Diagnosis not present

## 2021-04-12 DIAGNOSIS — G4709 Other insomnia: Secondary | ICD-10-CM | POA: Diagnosis not present

## 2021-04-12 DIAGNOSIS — E782 Mixed hyperlipidemia: Secondary | ICD-10-CM | POA: Diagnosis not present

## 2021-04-12 DIAGNOSIS — I10 Essential (primary) hypertension: Secondary | ICD-10-CM | POA: Diagnosis not present

## 2021-04-12 DIAGNOSIS — F341 Dysthymic disorder: Secondary | ICD-10-CM | POA: Diagnosis not present

## 2021-04-12 DIAGNOSIS — M353 Polymyalgia rheumatica: Secondary | ICD-10-CM | POA: Diagnosis not present

## 2021-04-12 DIAGNOSIS — N958 Other specified menopausal and perimenopausal disorders: Secondary | ICD-10-CM | POA: Diagnosis not present

## 2021-04-12 DIAGNOSIS — Z6828 Body mass index (BMI) 28.0-28.9, adult: Secondary | ICD-10-CM | POA: Diagnosis not present

## 2021-04-12 DIAGNOSIS — N183 Chronic kidney disease, stage 3 unspecified: Secondary | ICD-10-CM | POA: Diagnosis not present

## 2021-04-12 DIAGNOSIS — E7800 Pure hypercholesterolemia, unspecified: Secondary | ICD-10-CM | POA: Diagnosis not present

## 2021-04-13 DIAGNOSIS — M19079 Primary osteoarthritis, unspecified ankle and foot: Secondary | ICD-10-CM | POA: Diagnosis not present

## 2021-04-13 DIAGNOSIS — M79672 Pain in left foot: Secondary | ICD-10-CM | POA: Diagnosis not present

## 2021-04-13 DIAGNOSIS — K5901 Slow transit constipation: Secondary | ICD-10-CM | POA: Diagnosis not present

## 2021-04-13 DIAGNOSIS — M19072 Primary osteoarthritis, left ankle and foot: Secondary | ICD-10-CM | POA: Diagnosis not present

## 2021-04-14 DIAGNOSIS — M19072 Primary osteoarthritis, left ankle and foot: Secondary | ICD-10-CM | POA: Diagnosis not present

## 2021-04-15 DIAGNOSIS — G9331 Postviral fatigue syndrome: Secondary | ICD-10-CM | POA: Diagnosis not present

## 2021-04-15 DIAGNOSIS — R5383 Other fatigue: Secondary | ICD-10-CM | POA: Diagnosis not present

## 2021-04-15 DIAGNOSIS — Z8739 Personal history of other diseases of the musculoskeletal system and connective tissue: Secondary | ICD-10-CM | POA: Diagnosis not present

## 2021-04-15 DIAGNOSIS — M353 Polymyalgia rheumatica: Secondary | ICD-10-CM | POA: Diagnosis not present

## 2021-04-18 DIAGNOSIS — L820 Inflamed seborrheic keratosis: Secondary | ICD-10-CM | POA: Diagnosis not present

## 2021-04-20 ENCOUNTER — Ambulatory Visit (INDEPENDENT_AMBULATORY_CARE_PROVIDER_SITE_OTHER): Payer: Medicare Other | Admitting: Psychology

## 2021-04-20 DIAGNOSIS — F4323 Adjustment disorder with mixed anxiety and depressed mood: Secondary | ICD-10-CM

## 2021-04-20 NOTE — Progress Notes (Addendum)
Progress Note Date 04/20/2021  Diagnosis 34.89 Symptoms Anxiety/depression Medication Status na  Safety none  If Suicidal or Homicidal State Action Taken: unspecified  Current Risk: low Medications none Objectives unspecified Client Response full compliance  Service Location Location, [address] [place], [place] [address]  Service Code cpt [phone]  Self-monitoring  Self care activities  Distress tolerance skill  Validate/empathize  Rationally challenge thoughts or beliefs/cognitive restructuring  Facilitate problem solving  Comments  Patient agrees to have a Webex video session due to the pandemic. she is at home and I am in my home office.  Dx.: Adjustment Disorder  Goals: Patient reports she is seeking counseling to facilitate complicated family dynamics. She is also contending with anger and frustration associated with current family dynamics. Goal date is 2-23.  Gabrielle Lynch states that she has started the medicine, but is not having any benefits at this time. Told her to give it another few weeks to determine if the meds are helpful. She is leaving for [place] today for 10 days. Currently scheduled for Feb. 7 foot surgery. We talked about other issues impacting her depression. The reality that Rodman Key and his wife are likely to stay is [place] is an issue and difficult to accept. We discussed ways to help offset her feeling disconnected. The trip to [place] is at the encouragement of her sister and has some potential to be tense. Addressed strategies to manage the relationship. Will contact me upon return from the trip to assess the impact of the medicine and determine is dosage needs to be adjusted.       [name], PhD  Time: 7:40-8:30 50 minutes

## 2021-05-04 DIAGNOSIS — M19072 Primary osteoarthritis, left ankle and foot: Secondary | ICD-10-CM | POA: Diagnosis not present

## 2021-05-05 ENCOUNTER — Ambulatory Visit: Payer: Medicare Other | Admitting: Psychology

## 2021-05-12 DIAGNOSIS — E1129 Type 2 diabetes mellitus with other diabetic kidney complication: Secondary | ICD-10-CM | POA: Diagnosis not present

## 2021-05-12 DIAGNOSIS — E785 Hyperlipidemia, unspecified: Secondary | ICD-10-CM | POA: Diagnosis not present

## 2021-05-12 DIAGNOSIS — N1832 Chronic kidney disease, stage 3b: Secondary | ICD-10-CM | POA: Diagnosis not present

## 2021-05-12 DIAGNOSIS — I129 Hypertensive chronic kidney disease with stage 1 through stage 4 chronic kidney disease, or unspecified chronic kidney disease: Secondary | ICD-10-CM | POA: Diagnosis not present

## 2021-05-12 DIAGNOSIS — M353 Polymyalgia rheumatica: Secondary | ICD-10-CM | POA: Diagnosis not present

## 2021-05-18 DIAGNOSIS — E7800 Pure hypercholesterolemia, unspecified: Secondary | ICD-10-CM | POA: Diagnosis not present

## 2021-05-18 DIAGNOSIS — N183 Chronic kidney disease, stage 3 unspecified: Secondary | ICD-10-CM | POA: Diagnosis not present

## 2021-05-18 DIAGNOSIS — E039 Hypothyroidism, unspecified: Secondary | ICD-10-CM | POA: Diagnosis not present

## 2021-05-18 DIAGNOSIS — M8580 Other specified disorders of bone density and structure, unspecified site: Secondary | ICD-10-CM | POA: Diagnosis not present

## 2021-05-18 DIAGNOSIS — F341 Dysthymic disorder: Secondary | ICD-10-CM | POA: Diagnosis not present

## 2021-05-18 DIAGNOSIS — G43009 Migraine without aura, not intractable, without status migrainosus: Secondary | ICD-10-CM | POA: Diagnosis not present

## 2021-05-18 DIAGNOSIS — I10 Essential (primary) hypertension: Secondary | ICD-10-CM | POA: Diagnosis not present

## 2021-05-31 DIAGNOSIS — I129 Hypertensive chronic kidney disease with stage 1 through stage 4 chronic kidney disease, or unspecified chronic kidney disease: Secondary | ICD-10-CM | POA: Diagnosis not present

## 2021-05-31 DIAGNOSIS — E1122 Type 2 diabetes mellitus with diabetic chronic kidney disease: Secondary | ICD-10-CM | POA: Diagnosis not present

## 2021-05-31 DIAGNOSIS — E039 Hypothyroidism, unspecified: Secondary | ICD-10-CM | POA: Diagnosis not present

## 2021-05-31 DIAGNOSIS — D631 Anemia in chronic kidney disease: Secondary | ICD-10-CM | POA: Diagnosis not present

## 2021-05-31 DIAGNOSIS — N1832 Chronic kidney disease, stage 3b: Secondary | ICD-10-CM | POA: Diagnosis not present

## 2021-05-31 DIAGNOSIS — R7989 Other specified abnormal findings of blood chemistry: Secondary | ICD-10-CM | POA: Diagnosis not present

## 2021-05-31 DIAGNOSIS — Z79899 Other long term (current) drug therapy: Secondary | ICD-10-CM | POA: Diagnosis not present

## 2021-06-06 DIAGNOSIS — E782 Mixed hyperlipidemia: Secondary | ICD-10-CM | POA: Diagnosis not present

## 2021-06-06 DIAGNOSIS — E039 Hypothyroidism, unspecified: Secondary | ICD-10-CM | POA: Diagnosis not present

## 2021-06-06 DIAGNOSIS — I10 Essential (primary) hypertension: Secondary | ICD-10-CM | POA: Diagnosis not present

## 2021-06-20 DIAGNOSIS — M8580 Other specified disorders of bone density and structure, unspecified site: Secondary | ICD-10-CM | POA: Diagnosis not present

## 2021-06-20 DIAGNOSIS — E782 Mixed hyperlipidemia: Secondary | ICD-10-CM | POA: Diagnosis not present

## 2021-06-20 DIAGNOSIS — E119 Type 2 diabetes mellitus without complications: Secondary | ICD-10-CM | POA: Diagnosis not present

## 2021-06-20 DIAGNOSIS — E538 Deficiency of other specified B group vitamins: Secondary | ICD-10-CM | POA: Diagnosis not present

## 2021-06-20 DIAGNOSIS — D8940 Mast cell activation, unspecified: Secondary | ICD-10-CM | POA: Diagnosis not present

## 2021-06-20 DIAGNOSIS — K529 Noninfective gastroenteritis and colitis, unspecified: Secondary | ICD-10-CM | POA: Diagnosis not present

## 2021-06-20 DIAGNOSIS — E039 Hypothyroidism, unspecified: Secondary | ICD-10-CM | POA: Diagnosis not present

## 2021-06-20 DIAGNOSIS — G43009 Migraine without aura, not intractable, without status migrainosus: Secondary | ICD-10-CM | POA: Diagnosis not present

## 2021-06-20 DIAGNOSIS — E559 Vitamin D deficiency, unspecified: Secondary | ICD-10-CM | POA: Diagnosis not present

## 2021-06-20 DIAGNOSIS — R634 Abnormal weight loss: Secondary | ICD-10-CM | POA: Diagnosis not present

## 2021-06-20 DIAGNOSIS — G479 Sleep disorder, unspecified: Secondary | ICD-10-CM | POA: Diagnosis not present

## 2021-06-20 DIAGNOSIS — I959 Hypotension, unspecified: Secondary | ICD-10-CM | POA: Diagnosis not present

## 2021-06-23 ENCOUNTER — Ambulatory Visit (INDEPENDENT_AMBULATORY_CARE_PROVIDER_SITE_OTHER): Payer: Medicare Other | Admitting: Psychology

## 2021-06-23 DIAGNOSIS — F4323 Adjustment disorder with mixed anxiety and depressed mood: Secondary | ICD-10-CM | POA: Diagnosis not present

## 2021-06-23 NOTE — Progress Notes (Signed)
° ° ° ° ° °  Date 06/23/2021  Diagnosis 34.89 Symptoms Anxiety/depression Medication Status Anti-depressant Safety none  If Suicidal or Homicidal State Action Taken: N/A Current Risk: low Medications Anti-depressant Objectives Reduce symptoms of depression Client Response full compliance  Service Location Location, [address] [place],  [address]  Service Code cpt [phone]  Self-monitoring  Self care activities  Distress tolerance skill  Validate/empathize  Rationally challenge thoughts or beliefs/cognitive restructuring  Facilitate problem solving  Comments  Patient agrees to have a Webex video session due to the pandemic. she is at home and I am in my home office.  Dx.: Adjustment Disorder  Goals: Patient reports she is seeking counseling to facilitate complicated family dynamics. She is also contending with anger and frustration associated with current family dynamics. Goal date is 2-23.  [name] states she went off the medicine (Wellbutrin) because she did not like the way she felt on it. She says she is on "so many medicines" she did not want to continue. I talked with her about seeing a psychiatrist rather than her primary care doctor. She is willing to consider, but says it is her marriage and living situation that is creating the depression. She says, however, that she cannot imagine changing her circumstances. Says that her husband has no interest to work on the marriage. She says she is afraid that if she stays in the unhappy marriage, her husband will seek another relationship. She suspects that the cost of a divorce is what keeps him in the marriage as well as seeing other friends who are divorced and "not having a great time". She feels their lives are built around [place] and his interests. She feels she is the one who would be starting over. We discussed possible options. I suggested that she meet with Dr. Clovis Pu for a medication evaluation and she agrees. I wrote  him to see if she can get appointment prior to leaving the country at the end of March.           Gabrielle Morel, PhD  Time: 10:35a-11:30a 55 minutes

## 2021-06-29 DIAGNOSIS — E1129 Type 2 diabetes mellitus with other diabetic kidney complication: Secondary | ICD-10-CM | POA: Diagnosis not present

## 2021-06-29 DIAGNOSIS — M8588 Other specified disorders of bone density and structure, other site: Secondary | ICD-10-CM | POA: Diagnosis not present

## 2021-06-29 DIAGNOSIS — I129 Hypertensive chronic kidney disease with stage 1 through stage 4 chronic kidney disease, or unspecified chronic kidney disease: Secondary | ICD-10-CM | POA: Diagnosis not present

## 2021-06-29 DIAGNOSIS — I712 Thoracic aortic aneurysm, without rupture, unspecified: Secondary | ICD-10-CM | POA: Diagnosis not present

## 2021-06-29 DIAGNOSIS — M353 Polymyalgia rheumatica: Secondary | ICD-10-CM | POA: Diagnosis not present

## 2021-06-29 DIAGNOSIS — K921 Melena: Secondary | ICD-10-CM | POA: Diagnosis not present

## 2021-06-29 DIAGNOSIS — R1033 Periumbilical pain: Secondary | ICD-10-CM | POA: Diagnosis not present

## 2021-06-29 DIAGNOSIS — Z4681 Encounter for fitting and adjustment of insulin pump: Secondary | ICD-10-CM | POA: Diagnosis not present

## 2021-06-29 DIAGNOSIS — E039 Hypothyroidism, unspecified: Secondary | ICD-10-CM | POA: Diagnosis not present

## 2021-06-29 DIAGNOSIS — E785 Hyperlipidemia, unspecified: Secondary | ICD-10-CM | POA: Diagnosis not present

## 2021-06-29 DIAGNOSIS — I77810 Thoracic aortic ectasia: Secondary | ICD-10-CM | POA: Diagnosis not present

## 2021-06-29 DIAGNOSIS — N1832 Chronic kidney disease, stage 3b: Secondary | ICD-10-CM | POA: Diagnosis not present

## 2021-06-29 DIAGNOSIS — D649 Anemia, unspecified: Secondary | ICD-10-CM | POA: Diagnosis not present

## 2021-06-29 DIAGNOSIS — G729 Myopathy, unspecified: Secondary | ICD-10-CM | POA: Diagnosis not present

## 2021-06-29 DIAGNOSIS — D47Z9 Other specified neoplasms of uncertain behavior of lymphoid, hematopoietic and related tissue: Secondary | ICD-10-CM | POA: Diagnosis not present

## 2021-07-01 ENCOUNTER — Other Ambulatory Visit: Payer: Self-pay | Admitting: Internal Medicine

## 2021-07-01 DIAGNOSIS — I77810 Thoracic aortic ectasia: Secondary | ICD-10-CM

## 2021-07-07 DIAGNOSIS — D649 Anemia, unspecified: Secondary | ICD-10-CM | POA: Diagnosis not present

## 2021-07-07 DIAGNOSIS — N183 Chronic kidney disease, stage 3 unspecified: Secondary | ICD-10-CM | POA: Diagnosis not present

## 2021-07-07 DIAGNOSIS — D631 Anemia in chronic kidney disease: Secondary | ICD-10-CM | POA: Diagnosis not present

## 2021-07-07 DIAGNOSIS — E538 Deficiency of other specified B group vitamins: Secondary | ICD-10-CM | POA: Diagnosis not present

## 2021-07-07 DIAGNOSIS — R768 Other specified abnormal immunological findings in serum: Secondary | ICD-10-CM | POA: Diagnosis not present

## 2021-07-08 ENCOUNTER — Other Ambulatory Visit: Payer: Self-pay

## 2021-07-08 ENCOUNTER — Ambulatory Visit (INDEPENDENT_AMBULATORY_CARE_PROVIDER_SITE_OTHER): Payer: Medicare Other | Admitting: Psychiatry

## 2021-07-08 ENCOUNTER — Encounter: Payer: Self-pay | Admitting: Psychiatry

## 2021-07-08 VITALS — BP 179/80 | HR 86 | Ht 63.0 in

## 2021-07-08 DIAGNOSIS — F321 Major depressive disorder, single episode, moderate: Secondary | ICD-10-CM

## 2021-07-08 MED ORDER — VENLAFAXINE HCL ER 37.5 MG PO CP24: ORAL_CAPSULE | ORAL | 1 refills | Status: DC

## 2021-07-08 NOTE — Progress Notes (Addendum)
[facility] MD/PA/NP Initial Note  07/11/2021 7:39 PM Gabrielle Lynch  MRN:  [id number]  Chief Complaint:  Chief Complaint   Depression; Establish Care     HPI: Referred by Dr. Cheryln Manly for depression  CC lack of enjoyment.  Usually cheerful happy person normally.  Friends and H noticed. Sx for a couple of years.  More withdrawn and less active socially.  Covid contributing but didn't bounce book.  Less interest in doing things.  Don't care.  Less interested and less enjoyment.  Generally flat.  Very low energy.  Happy to not do anything.  Not really sad.  Conc ok.  Reduced appetite but takes Monjauro for DM.  No thoughts of death and no SI.  Gets protein. Takes clonazepam for sleep for a number of years and sleeps 8-10.  Normal pattern. No sig anxiety No alcohol use ever. Concerns about psych meds bc Mother was dependent.    Had infection which triggered PMR with 2 years of steroids and off for a year.  Never this down before.    Visit Diagnosis:    ICD-10-CM   1. Depression, major, single episode, moderate (HCC)  F32.1 venlafaxine XR (EFFEXOR-XR) 37.5 MG 24 hr capsule      Past Psychiatric History:   Dr. Cheryln Manly Parents divorced when pt young and saw child psychiatrist No history of psych meds other than sleep meds and effexor 75 mg daily for a few years for hot flashes Wellbutrin for a week ? effect  Past Medical History:  Past Medical History:  Diagnosis Date   Abnormal Pap smear of cervix    Hx of cryotherapy to cervix in her 85s   Abnormal uterine bleeding    history of fibroids   Anemia    Chronic kidney disease    stage 2   Diabetes mellitus without complication ([place])    type 2 diagnosed 3 years ago   DJD (degenerative joint disease)    Fibroid    Heart murmur    functional   History of hypertension NO MEDS SINCE LAP GASTRIC BAND 2010 --  WT. 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  STATES SHE CARRIES EPI PEN--BUT HAS NOT HAD ANY RECENT EPISODES.  STATES MANY OF THE MEDICATIONS SHE TAKES ARE TO HELP THIS PROBLEM - INCLUDING  Ketotifen-mast cell inhibitor-not approved in [place].    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)    Osteopenia    Other fracture of sacrum, initial encounter for closed fracture ([place]) 06/2017   PMR (polymyalgia rheumatica) (HCC)    PONV (postoperative nausea and vomiting)    Spondylosis     Past Surgical History:  Procedure Laterality Date   ABDOMINAL HYSTERECTOMY  02/20/2011   Procedure: HYSTERECTOMY ABDOMINAL;  Surgeon: Bennetta Laos, MD;  Location: [facility];  Service: Gynecology;  Laterality: N/A;   BREAST BIOPSY Right 05/02/2013   [facility]   CATARACT EXTRACTION, BILATERAL Bilateral 10/2019   CERVICAL BIOPSY  W/ LOOP ELECTRODE EXCISION  1992   CERVIX LESION DESTRUCTION     CESAREAN SECTION  1984, 1987   X2   CHOLECYSTECTOMY     COLONOSCOPY     DILATION AND CURETTAGE OF UTERUS  heavy menses   HYSTEROSCOPY  06/1995   HYSTEROSCOPIC MYOMECTOMY   HYSTEROSCOPY  06/1998   D&C, HYSTEROSCOPY   KNEE SURGERY     X2 right knee   LAPAROSCOPIC GASTRIC BANDING  08-03-2008   [facility]   LESION REMOVAL N/A 09/02/2012   Procedure: EXCISION VAGINAL LESION;  Surgeon: Terrance Mass, MD;  Location: [facility];  Service: Gynecology;  Laterality: N/A;  cpt 57100  one hour   PUBOVAGINAL SLING  02/20/2011   Procedure: Gaynelle Arabian;  Surgeon: Ailene Rud, MD;  Location: [facility];  Service: Urology;  Laterality: N/A;   RECTOCELE REPAIR N/A 12/19/2016   Procedure: POSTERIOR REPAIR (RECTOCELE);  Surgeon: Nunzio Cobbs, MD;  Location: [facility];  Service: Gynecology;  Laterality: N/A;  1.25 hours   SALPINGOOPHORECTOMY  02/20/2011    Procedure: SALPINGO OOPHERECTOMY;  Surgeon: Bennetta Laos, MD;  Location: [facility];  Service: Gynecology;  Laterality: Bilateral;   UPPER GASTROINTESTINAL ENDOSCOPY  06/2011   VENTRAL HERNIA REPAIR  05/24/2011   Procedure: LAPAROSCOPIC VENTRAL HERNIA;  Surgeon: Pedro Earls, MD;  Location: [facility];  Service: General;  Laterality: N/A;   WOUND EXPLORATION Left 02/22/2018   Procedure: EXPLORATION OF MASS  BEHIND LEFT EAR;  Surgeon: Johnathan Hausen, MD;  Location: [facility];  Service: General;  Laterality: Left;    Family Psychiatric History: M history of multiple psych meds and probably a drug problem  ? Dx but did have depression No other mental health family history  Family History:  Son and daughter doing well. Family History  Problem Relation Age of Onset   Breast cancer Mother        2's   Hypertension Mother    Thyroid disease Mother        hypothyroid   Cancer Father        LUNG   Thyroid disease Maternal Grandmother        hypothyroid    Social History:  H healthy.  Married 48 years. Social History   Socioeconomic History   Marital status: Married    Spouse name: Not on file   Number of children: Not on file   Years of education: Not on file   Highest education level: Not on file  Occupational History   Not on file  Tobacco Use   Smoking status: Never   Smokeless tobacco: Never  Vaping Use   Vaping Use: Never used  Substance and Sexual Activity   Alcohol use: No    Alcohol/week: 0.0 standard drinks    Comment: social   Drug use: No   Sexual activity: Not Currently    Partners: Male    Birth control/protection: Surgical    Comment: TAH/BSO  Other Topics Concern   Not on file  Social History Narrative   Lives with husband   Social Determinants of Health   Financial Resource Strain: Not on file  Food Insecurity: Not on file  Transportation Needs: Not on file  Physical Activity: Not on file  Stress: Not on file  Social Connections:  Not on file    Allergies:  Allergies  Allergen Reactions   Lisinopril Other (See Comments)   Demerol Nausea Only    Metabolic Disorder Labs: No results found for: HGBA1C, MPG No results found for: PROLACTIN No results found for: CHOL, TRIG, HDL, CHOLHDL, VLDL, LDLCALC No results found for: TSH  Therapeutic Level Labs: No results found for: LITHIUM No results found for:  VALPROATE No components found for:  CBMZ  Current Medications: Current Outpatient Medications  Medication Sig Dispense Refill   Calcium Citrate-Vitamin D 315-250 MG-UNIT TABS Take twice daily     cetirizine (ZYRTEC) 10 MG tablet Take 10 mg by mouth daily.     Cholecalciferol (VITAMIN D3) 50 MCG (2000 UT) capsule 1 capsule     clonazePAM (KLONOPIN) 1 MG tablet TAKE THREE TABLETS BY MOUTH EVERYDAY AT BEDTIME     denosumab (PROLIA) 60 MG/ML SOSY injection Inject 60 mg into the skin every 6 (six) months.     EPINEPHrine (EPI-PEN) 0.3 mg/0.3 mL DEVI Inject into the muscle as needed.     famotidine (PEPCID) 20 MG tablet 1 tablet at bedtime as needed     finasteride (PROSCAR) 5 MG tablet 1/2 tablet     hyoscyamine (LEVSIN SL) 0.125 MG SL tablet Place 1 tablet under the tongue as needed.     montelukast (SINGULAIR) 10 MG tablet Take 10 mg by mouth at bedtime.     ondansetron (ZOFRAN-ODT) 4 MG disintegrating tablet Take 1 tablet by mouth as needed.     PRESCRIPTION MEDICATION Take 1 tablet by mouth 2 (two) times daily. Ketotifen '5mg'$  table[place] For mast cell problem     rizatriptan (MAXALT) 10 MG tablet      rosuvastatin (CRESTOR) 20 MG tablet TAKE ONE TABLET BY MOUTH EVERYDAY AT BEDTIME     SYNTHROID 100 MCG tablet      tirzepatide (MOUNJARO) 5 MG/0.5ML Pen inject '5mg'$      topiramate (TOPAMAX) 50 MG tablet Take 50 mg by mouth daily.     UNABLE TO FIND 2 capsules 2 (two) times daily. Med Name: AlgaeCal Plus     VASCEPA 1 g CAPS Take 1 capsule by mouth 2 (two) times daily.     venlafaxine XR  (EFFEXOR-XR) 37.5 MG 24 hr capsule 1 daily for 1 week then 2 daily 30 capsule 1   hydrOXYzine (ATARAX/VISTARIL) 50 MG tablet Take 1 tablet by mouth 2 (two) times daily. (Patient not taking: Reported on 07/08/2021)     losartan (COZAAR) 50 MG tablet 1 tablet (Patient not taking: Reported on 07/08/2021)     scopolamine (TRANSDERM-SCOP, 1.5 MG,) 1 MG/3DAYS Place 1 patch (1.5 mg total) onto the skin every 3 (three) days. (Patient not taking: Reported on 07/08/2021) 10 patch 0   No current facility-administered medications for this visit.    Medication Side Effects: none  Orders placed this visit:  No orders of the defined types were placed in this encounter.   Psychiatric Specialty Exam:  Review of Systems  Constitutional:  Positive for appetite change and unexpected weight change.  HENT:  Negative for congestion, hearing loss, rhinorrhea, sore throat and tinnitus.   Eyes:  Negative for visual disturbance.  Respiratory:  Negative for shortness of breath and wheezing.   Cardiovascular:  Negative for chest pain and palpitations.  Gastrointestinal:  Negative for constipation, diarrhea and nausea.  Endocrine: Negative for polyuria.  Genitourinary:  Negative for enuresis, frequency, pelvic pain and urgency.  Musculoskeletal:  Negative for arthralgias and back pain.  Skin:  Negative for rash.  Allergic/Immunologic: Negative for environmental allergies.  Neurological:  Negative for dizziness, tremors, weakness and headaches.  Psychiatric/Behavioral:  Negative for agitation, behavioral problems, hallucinations and suicidal ideas.    Blood pressure (!) 179/80, pulse 86, height '5\' 3"'$  (1.6 m).Body mass index is 29.23 kg/m.  General Appearance: Casual and Well Groomed  Eye Contact:  Good  Speech:  Clear and Coherent and Normal Rate  Volume:  Normal  Mood:  Depressed  Affect:  Constricted and Depressed  Thought Process:  Goal Directed, no LOA  Orientation:  Full (Time, Place, and Person)  Thought  Content: No paranoia or delusions.  Suicidal Thoughts:  No  Homicidal Thoughts:  No  Memory:  WNL  Judgement:  Good  Insight:  Good  Psychomotor Activity:  Normal  Concentration:  Concentration: Good  Recall:  Good  Fund of Knowledge: Good  Language: Good  Assets:  Communication Skills Desire for Improvement Financial Resources/Insurance Housing Leisure Time Physical Health Resilience Social Support Talents/Skills Transportation Vocational/Educational  ADL's:  Intact  Cognition: WNL  Prognosis:  Good   Screenings: MDQ negative  Receiving Psychotherapy:  yes , Dr. Cheryln Manly  Treatment Plan/Recommendations: Greater than 50% of 60 min face to face time with patient was spent on counseling and coordination of care. We discussed the diagnosis of major depression.  She apparently has never had previous major depression.  We discussed the differences between major depression and a situational depression.  We discussed how biological factors such as her 2-year history of PMR associated with high-dose steroids predating the onset of this depression could have been a contributing factor as well.  We discussed the importance of continuing psychotherapy to address the situational factors that contribute as well as to develop strategies before improving her situation or at least her coping with the situation.  We discussed how the data supports the combination of medication plus psychotherapy in the treatment of major depression.  We discussed antidepressants in detail and the different types of antidepressants and different options and side effect profiles.  She is somewhat concerned about taking antidepressants because she saw her mother take excessive medication with negative outcomes.  We discussed the alternatives of Wellbutrin versus Effexor which she had taken in the past.  She feels more comfortable taking a medicine she has taken in the past even though it was for different purposes.  We  discussed the side effects of Effexor and the typical dosage range.  We discussed the tendency for withdrawal if she misses dosage.  We will proceed with a low dose and increase the dose if she does not get benefit.  Start venlafaxine XR 37.5 mg daily for 1 week and then increase to 75 mg daily.  Call if you have any problems with the med adjustment.  Follow-up 6 weeks  Lynder Parents MD, DFAPA     Purnell Shoemaker, MD

## 2021-07-12 ENCOUNTER — Ambulatory Visit (INDEPENDENT_AMBULATORY_CARE_PROVIDER_SITE_OTHER): Payer: Medicare Other | Admitting: Psychology

## 2021-07-12 DIAGNOSIS — F321 Major depressive disorder, single episode, moderate: Secondary | ICD-10-CM

## 2021-07-12 DIAGNOSIS — Z20822 Contact with and (suspected) exposure to covid-19: Secondary | ICD-10-CM | POA: Diagnosis not present

## 2021-07-12 NOTE — Progress Notes (Signed)
Progress Note ?Date 07/12/2021  ?Diagnosis ?34.89 ?Symptoms ?Anxiety/depression ?Medication Status ?Effexor '75mg'$   ?Safety ?none  ?If Suicidal or Homicidal State Action Taken: unspecified  ?Current Risk: low ?Medications ?none ?Objectives ?unspecified ?Client Response ?full compliance  ?Service Location ?Location, [address] [place], [place] [address]  ?Service Code ?cpt [id number]  ?Self-monitoring  ?Self care activities  ?Distress tolerance skill  ?Validate/empathize  ?Rationally challenge thoughts or beliefs/cognitive restructuring  ?Facilitate problem solving  ?Comments  ?Patient agrees to have a Webex video session due to the pandemic. she is at home and I am in my home office.  ?Dx.: Adjustment Disorder  ?Goals: Patient reports she is seeking counseling to facilitate complicated family dynamics. She is also contending with anger and frustration associated with current family dynamics. Reports depressive symptoms associated with relationship satisfaction and seeking to determine ways to improve family/marital situation and reduce symptoms. Goal date is 12-23.  ?Gabrielle Lynch saw Dr. Clovis Pu and he prescribed Effexor (37.'5mg'$ ) for 1 week and then to '75mg'$ . She liked him and scheduled another appointment in a month. She is making herself get out each day and it is helpful. She did not share with Pilar Plate that she is talking to me or to Dr. Clovis Pu. Suggested she bring up the topic of her happiness and their happiness. She isn't sure he is happy. He never gets excited about anything. Ilianna says that she gets excited about things, but he doesn't share that with her. She wants to have a 71th celebration for both of them, and he has no interest. We discussed when and how to have a conversation with Pilar Plate about their relationship. They are going to [place] next week to see Juliane Lack and family. Lots of travel is planned for next number of months. She will think about when will be the best time to approach Pilar Plate about her feelings and  to ask him about his perspective on their relationship and his happiness. Will consider the salient points of that discussion and a game plan of how to engage if she meets resistance or reluctance. Scheduled another appointment soon after returning from [place].    ?  ?   ? ?Gabrielle Morel, PhD  Time: 8:40-8:30 50 minutes  ? ? ?  ? ? ?

## 2021-07-14 DIAGNOSIS — I10 Essential (primary) hypertension: Secondary | ICD-10-CM | POA: Diagnosis not present

## 2021-07-14 DIAGNOSIS — E782 Mixed hyperlipidemia: Secondary | ICD-10-CM | POA: Diagnosis not present

## 2021-07-14 DIAGNOSIS — E039 Hypothyroidism, unspecified: Secondary | ICD-10-CM | POA: Diagnosis not present

## 2021-07-27 DIAGNOSIS — M810 Age-related osteoporosis without current pathological fracture: Secondary | ICD-10-CM | POA: Diagnosis not present

## 2021-07-27 DIAGNOSIS — M19072 Primary osteoarthritis, left ankle and foot: Secondary | ICD-10-CM | POA: Diagnosis not present

## 2021-08-09 ENCOUNTER — Other Ambulatory Visit: Payer: Self-pay | Admitting: Psychiatry

## 2021-08-09 DIAGNOSIS — F321 Major depressive disorder, single episode, moderate: Secondary | ICD-10-CM

## 2021-08-11 DIAGNOSIS — E119 Type 2 diabetes mellitus without complications: Secondary | ICD-10-CM | POA: Diagnosis not present

## 2021-08-11 DIAGNOSIS — I10 Essential (primary) hypertension: Secondary | ICD-10-CM | POA: Diagnosis not present

## 2021-08-11 DIAGNOSIS — E782 Mixed hyperlipidemia: Secondary | ICD-10-CM | POA: Diagnosis not present

## 2021-08-11 DIAGNOSIS — E039 Hypothyroidism, unspecified: Secondary | ICD-10-CM | POA: Diagnosis not present

## 2021-08-11 DIAGNOSIS — N183 Chronic kidney disease, stage 3 unspecified: Secondary | ICD-10-CM | POA: Diagnosis not present

## 2021-08-17 DIAGNOSIS — Z9884 Bariatric surgery status: Secondary | ICD-10-CM | POA: Diagnosis not present

## 2021-08-17 DIAGNOSIS — E1129 Type 2 diabetes mellitus with other diabetic kidney complication: Secondary | ICD-10-CM | POA: Diagnosis not present

## 2021-08-17 DIAGNOSIS — E559 Vitamin D deficiency, unspecified: Secondary | ICD-10-CM | POA: Diagnosis not present

## 2021-08-17 DIAGNOSIS — D649 Anemia, unspecified: Secondary | ICD-10-CM | POA: Diagnosis not present

## 2021-08-17 DIAGNOSIS — E039 Hypothyroidism, unspecified: Secondary | ICD-10-CM | POA: Diagnosis not present

## 2021-08-17 DIAGNOSIS — I129 Hypertensive chronic kidney disease with stage 1 through stage 4 chronic kidney disease, or unspecified chronic kidney disease: Secondary | ICD-10-CM | POA: Diagnosis not present

## 2021-08-17 DIAGNOSIS — E785 Hyperlipidemia, unspecified: Secondary | ICD-10-CM | POA: Diagnosis not present

## 2021-08-17 DIAGNOSIS — N1832 Chronic kidney disease, stage 3b: Secondary | ICD-10-CM | POA: Diagnosis not present

## 2021-08-22 NOTE — Progress Notes (Signed)
71 y.o. G2P2 Married Caucasian female here for annual breast and pelvic exam.   ? ?Got gastroenteritis after traveling to [place]. ?Took a while to recover per patient.  ? ?Has lost 50 pounds since her last visit.  ? ?She does feel cold all the time.  ?Her Synthroid dose is being lowered due to a low TSH.  ? ?She has chronic anemia.  ?Hgb 10.3 on 07/07/21.  ? ?Not using vaginal estrogen. ? ?Good bladder control.  ? ?Has constipation.  ?Using Colace regularly and it is not helping adequately.  ? ?PCP:  R.Marcellus Scott, MD ? ?No LMP recorded (lmp unknown). Patient has had a hysterectomy.     ?  ?    ?Sexually active: No.  ?The current method of family planning is status post hysterectomy.    ?Exercising: No.   ?Smoker:  no ? ?Health Maintenance: ?Pap:  09-07-16 Neg, 09-30-15 Neg,06-23-14 Neg:Neg HR HPV ?History of abnormal Pap:  yes ?MMG:  04-04-21 Neg/BiRads1 ?Colonoscopy:  2019 normal;next 2024 ?BMD: 06-04-20  Result :Osteopenia. Increase in BMD Left hip. Spine and right hip stable.  Orthopedist prescribing.  ?TDaP:  PCP ?Gardasil:   n/a ?HIV: 2012 NR ?Hep C: 2012 Neg ?Screening Labs:  PCP and oncology.  ? ? reports that she has never smoked. She has never used smokeless tobacco. She reports that she does not drink alcohol and does not use drugs. ? ?Past Medical History:  ?Diagnosis Date  ? Abnormal Pap smear of cervix   ? Hx of cryotherapy to cervix in her 55s  ? Abnormal uterine bleeding   ? history of fibroids  ? Anemia   ? Chronic kidney disease   ? stage 2  ? Diabetes mellitus without complication ([place])   ? type 2 diagnosed 3 years ago  ? DJD (degenerative joint disease)   ? Fibroid   ? Heart murmur   ? functional  ? History of hypertension NO MEDS SINCE LAP GASTRIC BAND 2010 --  WT. LOSS  ? Hypertension   ? Hypothyroidism   ? Low testosterone level in female 2019  ? Low testosterone level in female   ? Mast cell disorder diag 1989  ? 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  STATES SHE CARRIES EPI PEN--BUT HAS NOT HAD ANY RECENT EPISODES.  STATES MANY OF THE MEDICATIONS SHE TAKES ARE TO HELP THIS PROBLEM - INCLUDING  Ketotifen-mast cell inhibitor-not approved in [place].   ? Migraine   ? 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 Osteopenia   ? Other fracture of sacrum, initial encounter for closed fracture ([place]) 06/2017  ? PMR (polymyalgia rheumatica) (HCC)   ? PONV (postoperative nausea and vomiting)   ? Spondylosis   ? ? ?Past Surgical History:  ?Procedure Laterality Date  ? ABDOMINAL HYSTERECTOMY  02/20/2011  ? Procedure: HYSTERECTOMY ABDOMINAL;  Surgeon: Bennetta Laos, MD;  Location: [facility];  Service: Gynecology;  Laterality: N/A;  ? BREAST BIOPSY Right 05/02/2013  ? [facility]  ? CATARACT EXTRACTION, BILATERAL Bilateral 10/2019  ? CERVICAL BIOPSY  W/ LOOP ELECTRODE EXCISION  1992  ? CERVIX LESION DESTRUCTION    ? [place]  ? X2  ? CHOLECYSTECTOMY    ? COLONOSCOPY    ? DILATION AND CURETTAGE OF UTERUS    ? heavy menses  ? HYSTEROSCOPY  06/1995  ? HYSTEROSCOPIC MYOMECTOMY  ? HYSTEROSCOPY  06/1998  ?  D&C, HYSTEROSCOPY  ? KNEE SURGERY    ? X2 right knee  ? LAPAROSCOPIC GASTRIC BANDING  08-03-2008  ? [place]  ? LESION REMOVAL N/A 09/02/2012  ? Procedure: EXCISION VAGINAL LESION;  Surgeon: Terrance Mass, MD;  Location[facility];  Service: Gynecology;  Laterality: N/A;  cpt 57100 ? ?one hour  ? PUBOVAGINAL SLING  02/20/2011  ? Procedure: Gaynelle Arabian;  Surgeon: Ailene Rud, MD;  Location: [facility];  Service: Urology;  Laterality: N/A;  ? RECTOCELE REPAIR N/A 12/19/2016  ? Procedure: POSTERIOR REPAIR (RECTOCELE);  Surgeon: Nunzio Cobbs, MD;  Location: [facility];  Service: Gynecology;  Laterality: N/A;  1.25 hours  ? SALPINGOOPHORECTOMY  02/20/2011  ? Procedure: SALPINGO OOPHERECTOMY;  Surgeon: Bennetta Laos, MD;  Location: [facility];  Service: Gynecology;  Laterality: Bilateral;   ? UPPER GASTROINTESTINAL ENDOSCOPY  06/2011  ? VENTRAL HERNIA REPAIR  05/24/2011  ? Procedure: LAPAROSCOPIC VENTRAL HERNIA;  Surgeon: Pedro Earls, MD;  Location[facility];  Service: General;  Laterality: N/A;  ? WOUND EXPLORATION Left 02/22/2018  ? Procedure: EXPLORATION OF MASS  BEHIND LEFT EAR;  Surgeon: Johnathan Hausen, MD;  Location: [facility];  Service: General;  Laterality: Left;  ? ? ?Current Outpatient Medications  ?Medication Sig Dispense Refill  ? Calcium Citrate-Vitamin D 315-250 MG-UNIT TABS Take twice daily    ? cetirizine (ZYRTEC) 10 MG tablet Take 10 mg by mouth daily.    ? Cholecalciferol (VITAMIN D3) 50 MCG (2000 UT) capsule 1 capsule    ? clonazePAM (KLONOPIN) 1 MG tablet TAKE THREE TABLETS BY MOUTH EVERYDAY AT BEDTIME    ? denosumab (PROLIA) 60 MG/ML SOSY injection Inject 60 mg into the skin every 6 (six) months.    ? EPINEPHrine (EPI-PEN) 0.3 mg/0.3 mL DEVI Inject into the muscle as needed.    ? famotidine (PEPCID) 20 MG tablet 1 tablet at bedtime as needed    ? finasteride (PROSCAR) 5 MG tablet 1/2 tablet    ? hydrOXYzine (ATARAX/VISTARIL) 50 MG tablet Take 1 tablet by mouth 2 (two) times daily.    ? hyoscyamine (LEVSIN SL) 0.125 MG SL tablet Place 1 tablet under the tongue as needed.    ? losartan (COZAAR) 50 MG tablet     ? montelukast (SINGULAIR) 10 MG tablet Take 10 mg by mouth at bedtime.    ? ondansetron (ZOFRAN-ODT) 4 MG disintegrating tablet Take 1 tablet by mouth as needed.    ? PRESCRIPTION MEDICATION Take 1 tablet by mouth 2 (two) times daily. Ketotifen '5mg'$  tablet[place]. For mast cell problem    ? rizatriptan (MAXALT) 10 MG tablet     ? rosuvastatin (CRESTOR) 20 MG tablet TAKE ONE TABLET BY MOUTH EVERYDAY AT BEDTIME    ? scopolamine (TRANSDERM-SCOP, 1.5 MG,) 1 MG/3DAYS Place 1 patch (1.5 mg total) onto the skin every 3 (three) days. 10 patch 0  ? SYNTHROID 100 MCG tablet     ? tirzepatide (MOUNJARO) 5 MG/0.5ML Pen inject '5mg'$     ? topiramate  (TOPAMAX) 50 MG tablet Take 50 mg by mouth daily.    ? UNABLE TO FIND 2 capsules 2 (two) times daily. Med Name: AlgaeCal Plus    ? VASCEPA 1 g CAPS Take 1 capsule by mouth 2 (two) times daily.    ? venlafaxine XR (EFFEXOR-XR) 75 MG 24 hr capsule Take 1 capsule (75 mg total) by mouth daily with breakfast. TAKE ONE  CAPSULE DAILY FOR ONE WEEK THEN INCREASE TO TWO CAPSULES DAILY 30 capsule 0  ? ?No current facility-administered medications for this visit.  ? ? ?Family History  ?Problem Relation Age of Onset  ? Breast cancer Mother   ?     89's  ? Hypertension Mother   ? Thyroid disease Mother   ?     hypothyroid  ? Cancer Father   ?     LUNG  ? Thyroid disease Maternal Grandmother   ?     hypothyroid  ? ? ?Review of Systems  ?All other systems reviewed and are negative. ? ?Exam:   ?BP 120/80 (BP Location: Left Arm, Patient Position: Sitting, Cuff Size: Normal)   Ht '5\' 3"'$  (1.6 m)   Wt 125 lb (56.7 kg)   LMP  (LMP Unknown)   SpO2 98%   BMI 22.14 kg/m?     ?General appearance: alert, cooperative and appears stated age ?Head: normocephalic, without obvious abnormality, atraumatic ?Neck: no adenopathy, supple, symmetrical, trachea midline and thyroid normal to inspection and palpation ?Lungs: clear to auscultation bilaterally ?Breasts: normal appearance, no masses or tenderness, No nipple retraction or dimpling, No nipple discharge or bleeding, No axillary adenopathy ?Heart: regular rate and rhythm ?Abdomen: port palpable in right abdomen.  Abdomen is soft, non-tender; no masses, no organomegaly ?Extremities: extremities normal, atraumatic, no cyanosis or edema ?Skin: skin color, texture, turgor normal. No rashes or lesions ?Lymph nodes: cervical, supraclavicular, and axillary nodes normal. ?Neurologic: grossly normal ? ?Pelvic: External genitalia:  no lesions ?             No abnormal inguinal nodes palpated. ?             Urethra:  normal appearing urethra with no masses, tenderness or lesions ?             Bartholins  and Skenes: normal    ?             Vagina: normal appearing vagina with normal color and discharge, no lesions.  Atrophy noted.  ?             Cervix: no lesions ?             Pap taken: yes ?Bimanual Exa

## 2021-08-24 ENCOUNTER — Ambulatory Visit: Payer: Medicare Other | Admitting: Obstetrics and Gynecology

## 2021-08-30 ENCOUNTER — Encounter: Payer: Self-pay | Admitting: Psychiatry

## 2021-08-30 ENCOUNTER — Encounter: Payer: Self-pay | Admitting: Obstetrics and Gynecology

## 2021-08-30 ENCOUNTER — Other Ambulatory Visit (HOSPITAL_COMMUNITY)
Admission: RE | Admit: 2021-08-30 | Discharge: 2021-08-30 | Disposition: A | Discharge status: Home / Self Care | Payer: Medicare Other | Source: Ambulatory Visit | Attending: Obstetrics and Gynecology | Admitting: Obstetrics and Gynecology

## 2021-08-30 ENCOUNTER — Ambulatory Visit (INDEPENDENT_AMBULATORY_CARE_PROVIDER_SITE_OTHER): Payer: Medicare Other | Admitting: Obstetrics and Gynecology

## 2021-08-30 ENCOUNTER — Ambulatory Visit (INDEPENDENT_AMBULATORY_CARE_PROVIDER_SITE_OTHER): Payer: Medicare Other | Admitting: Psychiatry

## 2021-08-30 VITALS — BP 120/80 | Ht 63.0 in | Wt 125.0 lb

## 2021-08-30 DIAGNOSIS — N952 Postmenopausal atrophic vaginitis: Secondary | ICD-10-CM

## 2021-08-30 DIAGNOSIS — F321 Major depressive disorder, single episode, moderate: Secondary | ICD-10-CM

## 2021-08-30 DIAGNOSIS — Z1272 Encounter for screening for malignant neoplasm of vagina: Secondary | ICD-10-CM | POA: Insufficient documentation

## 2021-08-30 DIAGNOSIS — D649 Anemia, unspecified: Secondary | ICD-10-CM | POA: Diagnosis not present

## 2021-08-30 DIAGNOSIS — Z01419 Encounter for gynecological examination (general) (routine) without abnormal findings: Secondary | ICD-10-CM

## 2021-08-30 DIAGNOSIS — M7582 Other shoulder lesions, left shoulder: Secondary | ICD-10-CM | POA: Diagnosis not present

## 2021-08-30 DIAGNOSIS — Z1151 Encounter for screening for human papillomavirus (HPV): Secondary | ICD-10-CM | POA: Insufficient documentation

## 2021-08-30 DIAGNOSIS — K5900 Constipation, unspecified: Secondary | ICD-10-CM | POA: Diagnosis not present

## 2021-08-30 MED ORDER — VENLAFAXINE HCL ER 37.5 MG PO CP24: 112.5000 mg | ORAL_CAPSULE | Freq: Every day | ORAL | 1 refills | Status: DC

## 2021-08-30 MED ORDER — ESTRADIOL 10 MCG VA TABS: 1.0000 | ORAL_TABLET | VAGINAL | 3 refills | Status: DC

## 2021-08-30 NOTE — Progress Notes (Signed)
Gabrielle Lynch ?[id number] ?1950-08-26 ?71 y.o. ? ?Subjective:  ? ?Patient ID:  Gabrielle Lynch is a 71 y.o. (DOB 08-Sep-1950) female. ? ?Chief Complaint:  ?Chief Complaint  ?Patient presents with  ? Follow-up  ? Depression  ? Sleeping Problem  ? ? ?HPI ?Gabrielle Lynch presents to the office today for follow-up of major depression.  She was first seen 07/08/2021 upon referral from Dr. Cheryln Manly.  Venlafaxine XR 37.5 mg was added in increased to 75 mg daily.  She has been on clonazepam for a number of years for sleep. ? ?Not noticed much change but another person remarked about seeing the old [place].  A couple of people noticed she was more active and outgoing.  Not feeling bad.  Still a lack of enjoyment.   ?Stopped losing weight.  Not eating as usual. Has lost 50+#.  Try to eat enough protein.  Eats meat. ?On Effexor XR 75 mg daily. ?No SE. ?Looking forward to celebrating 71th birthday upcoming. ?  ? ?Review of Systems:  ?Review of Systems  ?Constitutional:  Positive for appetite change and unexpected weight change. Negative for diaphoresis.  ?Neurological:  Negative for tremors and weakness.  ? ?Medications: I have reviewed the patient's current medications. ? ?Current Outpatient Medications  ?Medication Sig Dispense Refill  ? Calcium Citrate-Vitamin D 315-250 MG-UNIT TABS Take twice daily    ? cetirizine (ZYRTEC) 10 MG tablet Take 10 mg by mouth daily.    ? Cholecalciferol (VITAMIN D3) 50 MCG (2000 UT) capsule 1 capsule    ? clonazePAM (KLONOPIN) 1 MG tablet TAKE THREE TABLETS BY MOUTH EVERYDAY AT BEDTIME    ? denosumab (PROLIA) 60 MG/ML SOSY injection Inject 60 mg into the skin every 6 (six) months.    ? EPINEPHrine (EPI-PEN) 0.3 mg/0.3 mL DEVI Inject into the muscle as needed.    ? [START ON 09/01/2021] Estradiol (VAGIFEM) 10 MCG TABS vaginal tablet Place 1 tablet (10 mcg total) vaginally 2 (two) times a week. Use every night before bed for two weeks when you first begin this medicine, then after the first two weeks,  begin using it twice a week. 34 tablet 3  ? famotidine (PEPCID) 20 MG tablet 1 tablet at bedtime as needed    ? finasteride (PROSCAR) 5 MG tablet 1/2 tablet    ? hydrOXYzine (ATARAX/VISTARIL) 50 MG tablet Take 1 tablet by mouth 2 (two) times daily.    ? hyoscyamine (LEVSIN SL) 0.125 MG SL tablet Place 1 tablet under the tongue as needed.    ? losartan (COZAAR) 50 MG tablet     ? montelukast (SINGULAIR) 10 MG tablet Take 10 mg by mouth at bedtime.    ? ondansetron (ZOFRAN-ODT) 4 MG disintegrating tablet Take 1 tablet by mouth as needed.    ? PRESCRIPTION MEDICATION Take 1 tablet by mouth 2 (two) times daily. Ketotifen '5mg'$  table[place] For mast cell problem    ? rizatriptan (MAXALT) 10 MG tablet     ? rosuvastatin (CRESTOR) 20 MG tablet TAKE ONE TABLET BY MOUTH EVERYDAY AT BEDTIME    ? scopolamine (TRANSDERM-SCOP, 1.5 MG,) 1 MG/3DAYS Place 1 patch (1.5 mg total) onto the skin every 3 (three) days. 10 patch 0  ? SYNTHROID 100 MCG tablet     ? tirzepatide (MOUNJARO) 5 MG/0.5ML Pen inject '5mg'$     ? topiramate (TOPAMAX) 50 MG tablet Take 50 mg by mouth daily.    ? UNABLE TO FIND 2 capsules 2 (two) times  daily. Med Name: AlgaeCal Plus    ? VASCEPA 1 g CAPS Take 1 capsule by mouth 2 (two) times daily.    ? venlafaxine XR (EFFEXOR-XR) 75 MG 24 hr capsule Take 1 capsule (75 mg total) by mouth daily with breakfast. TAKE ONE CAPSULE DAILY FOR ONE WEEK THEN INCREASE TO TWO CAPSULES DAILY 30 capsule 0  ? ?No current facility-administered medications for this visit.  ? ? ?Medication Side Effects: None ? ?Allergies:  ?Allergies  ?Allergen Reactions  ? Lisinopril Other (See Comments)  ? Demerol Nausea Only  ? ? ?Past Medical History:  ?Diagnosis Date  ? Abnormal Pap smear of cervix   ? Hx of cryotherapy to cervix in her 56s  ? Abnormal uterine bleeding   ? history of fibroids  ? Anemia   ? Chronic kidney disease   ? stage 2  ? Diabetes mellitus without complication ([place])   ? type 2 diagnosed 3 years ago  ?  DJD (degenerative joint disease)   ? Fibroid   ? Heart murmur   ? functional  ? History of hypertension NO MEDS SINCE LAP GASTRIC BAND 2010 --  WT. LOSS  ? Hypertension   ? Hypothyroidism   ? Low testosterone level in female 2019  ? Low testosterone level in female   ? Mast cell disorder diag 1989  ? 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  STATES SHE CARRIES EPI PEN--BUT HAS NOT HAD ANY RECENT EPISODES.  STATES MANY OF THE MEDICATIONS SHE TAKES ARE TO HELP THIS PROBLEM - INCLUDING  Ketotifen-mast cell inhibitor-not approved in [place].   ? Migraine   ? 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 Osteopenia   ? Other fracture of sacrum, initial encounter for closed fracture ([place]) 06/2017  ? PMR (polymyalgia rheumatica) (HCC)   ? PONV (postoperative nausea and vomiting)   ? Spondylosis   ? ? ?Past Medical History, Surgical history, Social history, and Family history were reviewed and updated as appropriate.  ? ?Please see review of systems for further details on the patient's review from today.  ? ?Objective:  ? ?Physical Exam:  ?LMP  (LMP Unknown)  ? ?Physical Exam ?Constitutional:   ?   General: She is not in acute distress. ?Musculoskeletal:     ?   General: No deformity.  ?Neurological:  ?   Mental Status: She is alert and oriented to person, place, and time.  ?   Coordination: Coordination normal.  ?Psychiatric:     ?   Attention and Perception: Attention and perception normal. She does not perceive auditory or visual hallucinations.     ?   Mood and Affect: Mood is depressed. Mood is not anxious. Affect is not labile, blunt, angry or inappropriate.     ?   Speech: Speech normal.     ?   Behavior: Behavior normal.     ?   Thought Content: Thought content normal. Thought content is not paranoid or delusional. Thought content does not include homicidal or suicidal ideation. Thought content does not include suicidal plan.     ?    Cognition and Memory: Cognition and memory normal.     ?   Judgment: Judgment normal.  ?   Comments: Insight intact  ? ? ?Lab Review:  ?   ?Component Value Date/Time  ? NA 142 02/20/2018 1200  ? K 3.4 (L) 02/20/2018 1200  ? CL  108 02/20/2018 1200  ? CO2 25 02/20/2018 1200  ? GLUCOSE 86 02/20/2018 1200  ? BUN 18 02/20/2018 1200  ? CREATININE 1.28 (H) 02/20/2018 1200  ? CALCIUM 9.2 02/20/2018 1200  ? PROT 6.3 07/28/2008 1030  ? ALBUMIN 3.8 07/28/2008 1030  ? AST 38 (H) 07/28/2008 1030  ? ALT 45 (H) 07/28/2008 1030  ? ALKPHOS 102 07/28/2008 1030  ? BILITOT 0.6 07/28/2008 1030  ? GFRNONAA 43 (L) 02/20/2018 1200  ? GFRAA 49 (L) 02/20/2018 1200  ? ? ?   ?Component Value Date/Time  ? WBC 9.2 12/12/2016 1120  ? RBC 3.69 (L) 12/12/2016 1120  ? HGB 10.7 (L) 12/12/2016 1120  ? HCT 33.4 (L) 12/12/2016 1120  ? PLT 383 12/12/2016 1120  ? MCV 90.5 12/12/2016 1120  ? MCH 29.0 12/12/2016 1120  ? MCHC 32.0 12/12/2016 1120  ? RDW 14.3 12/12/2016 1120  ? LYMPHSABS 1.0 05/25/2011 0355  ? MONOABS 1.5 (H) 05/25/2011 0355  ? EOSABS 0.0 05/25/2011 0355  ? BASOSABS 0.0 05/25/2011 0355  ? ? ?No results found for: POCLITH, LITHIUM  ? ?No results found for: PHENYTOIN, PHENOBARB, VALPROATE, CBMZ  ? ?.res ?Assessment: Plan:   ? ?Feleica was seen today for follow-up, depression and sleeping problem. ? ?Diagnoses and all orders for this visit: ? ?Depression, major, single episode, moderate ([place]) ? ?Depression is not resolved though some have seen some improvement.  She has not noticed much improvement but also no side effects.  We discussed the dosage range of Effexor  ?Increase Effexor XR 112.5 mg daily.  Disc SE. ? ?Make sure enough protein and explained reasons.  Wt loss stopped. ? ?Call if there is any problems with the dosage increase ? ?Follow-up 4 weeks ? ?Lynder Parents MD, DFAPA ? ? ?Please see After Visit Summary for patient specific instructions. ? ?Future Appointments  ?Date Time Provider [place]  ?09/01/2021 10:20 AM [facility]  [facility]  ? ? ?No orders of the defined types were placed in this encounter. ? ? ?------------------------------- ?

## 2021-08-30 NOTE — Patient Instructions (Addendum)
Please discuss with your primary care provider your anemia for you to understand why this is occurring.  ?If you have iron deficiency, you may be a candidate for either oral iron supplementation or an intravenous iron infusion.  ? ? ?EXERCISE AND DIET:  We recommended that you start or continue a regular exercise program for good health. Regular exercise means any activity that makes your heart beat faster and makes you sweat.  We recommend exercising at least 30 minutes per day at least 3 days a week, preferably 4 or 5.  We also recommend a diet low in fat and sugar.  Inactivity, poor dietary choices and obesity can cause diabetes, heart attack, stroke, and kidney damage, among others.   ? ?ALCOHOL AND SMOKING:  Women should limit their alcohol intake to no more than 7 drinks/beers/glasses of wine (combined, not each!) per week. Moderation of alcohol intake to this level decreases your risk of breast cancer and liver damage. And of course, no recreational drugs are part of a healthy lifestyle.  And absolutely no smoking or even second hand smoke. Most people know smoking can cause heart and lung diseases, but did you know it also contributes to weakening of your bones? Aging of your skin?  Yellowing of your teeth and nails? ? ?CALCIUM AND VITAMIN D:  Adequate intake of calcium and Vitamin D are recommended.  The recommendations for exact amounts of these supplements seem to change often, but generally speaking 600 mg of calcium (either carbonate or citrate) and 800 units of Vitamin D per day seems prudent. Certain women may benefit from higher intake of Vitamin D.  If you are among these women, your doctor will have told you during your visit.   ? ?PAP SMEARS:  Pap smears, to check for cervical cancer or precancers,  have traditionally been done yearly, although recent scientific advances have shown that most women can have pap smears less often.  However, every woman still should have a physical exam from her  gynecologist every year. It will include a breast check, inspection of the vulva and vagina to check for abnormal growths or skin changes, a visual exam of the cervix, and then an exam to evaluate the size and shape of the uterus and ovaries.  And after 70 years of age, a rectal exam is indicated to check for rectal cancers. We will also provide age appropriate advice regarding health maintenance, like when you should have certain vaccines, screening for sexually transmitted diseases, bone density testing, colonoscopy, mammograms, etc.  ? ?MAMMOGRAMS:  All women over 26 years old should have a yearly mammogram. Many facilities now offer a "3D" mammogram, which may cost around $50 extra out of pocket. If possible,  we recommend you accept the option to have the 3D mammogram performed.  It both reduces the number of women who will be called back for extra views which then turn out to be normal, and it is better than the routine mammogram at detecting truly abnormal areas.   ? ?COLONOSCOPY:  Colonoscopy to screen for colon cancer is recommended for all women at age 70.  We know, you hate the idea of the prep.  We agree, BUT, having colon cancer and not knowing it is worse!!  Colon cancer so often starts as a polyp that can be seen and removed at colonscopy, which can quite literally save your life!  And if your first colonoscopy is normal and you have no family history of colon cancer, most women  don't have to have it again for 10 years.  Once every ten years, you can do something that may end up saving your life, right?  We will be happy to help you get it scheduled when you are ready.  Be sure to check your insurance coverage so you understand how much it will cost.  It may be covered as a preventative service at no cost, but you should check your particular policy.   ? ? ?

## 2021-08-31 DIAGNOSIS — D649 Anemia, unspecified: Secondary | ICD-10-CM | POA: Diagnosis not present

## 2021-08-31 DIAGNOSIS — I77810 Thoracic aortic ectasia: Secondary | ICD-10-CM | POA: Diagnosis not present

## 2021-08-31 DIAGNOSIS — R634 Abnormal weight loss: Secondary | ICD-10-CM | POA: Diagnosis not present

## 2021-08-31 DIAGNOSIS — E039 Hypothyroidism, unspecified: Secondary | ICD-10-CM | POA: Diagnosis not present

## 2021-08-31 DIAGNOSIS — R1033 Periumbilical pain: Secondary | ICD-10-CM | POA: Diagnosis not present

## 2021-08-31 DIAGNOSIS — K921 Melena: Secondary | ICD-10-CM | POA: Diagnosis not present

## 2021-08-31 DIAGNOSIS — M353 Polymyalgia rheumatica: Secondary | ICD-10-CM | POA: Diagnosis not present

## 2021-09-01 ENCOUNTER — Ambulatory Visit
Admission: RE | Admit: 2021-09-01 | Discharge: 2021-09-01 | Disposition: A | Discharge status: Home / Self Care | Payer: Medicare Other | Source: Ambulatory Visit | Attending: Internal Medicine | Admitting: Internal Medicine

## 2021-09-01 DIAGNOSIS — I77810 Thoracic aortic ectasia: Secondary | ICD-10-CM

## 2021-09-01 DIAGNOSIS — I712 Thoracic aortic aneurysm, without rupture, unspecified: Secondary | ICD-10-CM | POA: Diagnosis not present

## 2021-09-01 LAB — CYTOLOGY - PAP
Comment: NEGATIVE
Diagnosis: NEGATIVE
High risk HPV: NEGATIVE

## 2021-09-01 MED ORDER — IOPAMIDOL (ISOVUE-370) INJECTION 76%
75.0000 mL | Freq: Once | INTRAVENOUS | Status: AC | PRN
  Administered 2021-09-01: 75 mL via INTRAVENOUS

## 2021-09-05 DIAGNOSIS — M19072 Primary osteoarthritis, left ankle and foot: Secondary | ICD-10-CM | POA: Diagnosis not present

## 2021-09-05 DIAGNOSIS — Z20822 Contact with and (suspected) exposure to covid-19: Secondary | ICD-10-CM | POA: Diagnosis not present

## 2021-09-07 DIAGNOSIS — E119 Type 2 diabetes mellitus without complications: Secondary | ICD-10-CM | POA: Diagnosis not present

## 2021-09-07 DIAGNOSIS — Z8619 Personal history of other infectious and parasitic diseases: Secondary | ICD-10-CM | POA: Diagnosis not present

## 2021-09-07 DIAGNOSIS — M353 Polymyalgia rheumatica: Secondary | ICD-10-CM | POA: Diagnosis not present

## 2021-09-07 DIAGNOSIS — D649 Anemia, unspecified: Secondary | ICD-10-CM | POA: Diagnosis not present

## 2021-09-07 DIAGNOSIS — T781XXA Other adverse food reactions, not elsewhere classified, initial encounter: Secondary | ICD-10-CM | POA: Diagnosis not present

## 2021-09-07 DIAGNOSIS — Z77120 Contact with and (suspected) exposure to mold (toxic): Secondary | ICD-10-CM | POA: Diagnosis not present

## 2021-09-07 DIAGNOSIS — R5382 Chronic fatigue, unspecified: Secondary | ICD-10-CM | POA: Diagnosis not present

## 2021-09-07 DIAGNOSIS — Z713 Dietary counseling and surveillance: Secondary | ICD-10-CM | POA: Diagnosis not present

## 2021-09-07 DIAGNOSIS — D8940 Mast cell activation, unspecified: Secondary | ICD-10-CM | POA: Diagnosis not present

## 2021-09-07 DIAGNOSIS — E639 Nutritional deficiency, unspecified: Secondary | ICD-10-CM | POA: Diagnosis not present

## 2021-09-07 DIAGNOSIS — E039 Hypothyroidism, unspecified: Secondary | ICD-10-CM | POA: Diagnosis not present

## 2021-09-07 DIAGNOSIS — R5381 Other malaise: Secondary | ICD-10-CM | POA: Diagnosis not present

## 2021-09-12 DIAGNOSIS — Z77120 Contact with and (suspected) exposure to mold (toxic): Secondary | ICD-10-CM | POA: Diagnosis not present

## 2021-09-13 DIAGNOSIS — Z23 Encounter for immunization: Secondary | ICD-10-CM | POA: Diagnosis not present

## 2021-09-14 DIAGNOSIS — N183 Chronic kidney disease, stage 3 unspecified: Secondary | ICD-10-CM | POA: Diagnosis not present

## 2021-09-14 DIAGNOSIS — E119 Type 2 diabetes mellitus without complications: Secondary | ICD-10-CM | POA: Diagnosis not present

## 2021-09-14 DIAGNOSIS — E039 Hypothyroidism, unspecified: Secondary | ICD-10-CM | POA: Diagnosis not present

## 2021-09-14 DIAGNOSIS — E782 Mixed hyperlipidemia: Secondary | ICD-10-CM | POA: Diagnosis not present

## 2021-09-14 DIAGNOSIS — G43009 Migraine without aura, not intractable, without status migrainosus: Secondary | ICD-10-CM | POA: Diagnosis not present

## 2021-09-14 DIAGNOSIS — I10 Essential (primary) hypertension: Secondary | ICD-10-CM | POA: Diagnosis not present

## 2021-09-16 ENCOUNTER — Ambulatory Visit (INDEPENDENT_AMBULATORY_CARE_PROVIDER_SITE_OTHER): Payer: Medicare Other | Admitting: Psychiatry

## 2021-09-16 ENCOUNTER — Encounter: Payer: Self-pay | Admitting: Psychiatry

## 2021-09-16 DIAGNOSIS — F321 Major depressive disorder, single episode, moderate: Secondary | ICD-10-CM

## 2021-09-16 DIAGNOSIS — M19071 Primary osteoarthritis, right ankle and foot: Secondary | ICD-10-CM | POA: Diagnosis not present

## 2021-09-16 MED ORDER — VENLAFAXINE HCL ER 37.5 MG PO CP24: 112.5000 mg | ORAL_CAPSULE | Freq: Every day | ORAL | 0 refills | Status: DC

## 2021-09-16 NOTE — Progress Notes (Signed)
CARL BLEECKER [id number] 12/11/1950 71 y.o.  Subjective:   Patient ID:  Gabrielle Lynch is a 71 y.o. (DOB 14-Jan-1951) female.  Chief Complaint:  Chief Complaint  Patient presents with   Follow-up    HPI Gabrielle Lynch presents to the office today for follow-up of major depression.  She was first seen 07/08/2021 upon referral from Dr. Cheryln Manly.  Venlafaxine XR 37.5 mg was added and increased to 75 mg daily.  She has been on clonazepam for a number of years for sleep. . 08/30/21 appt noted: Not noticed much change but another person remarked about seeing the old [place].  A couple of people noticed she was more active and outgoing.  Not feeling bad.  Still a lack of enjoyment.   Stopped losing weight.  Not eating as usual. Has lost 50+#.  Try to eat enough protein.  Eats meat. On Effexor XR 75 mg daily. No SE. Looking forward to celebrating 71th birthday upcoming.  Plan: Increase Effexor XR 112.5 mg daily.  09/16/21 appt noted: I think it might be helping a little.  Going on a trip for 3 weeks. Leaving Sunday.  No SE.   No profound sadness but still some lack of enjoyment but not as bad as it was.  But could be better. No sign anxiety.  Sleep is fine with 2 mg clonazepam HS. Energy a little better but not normal.  Concentration ok.  Motivation a little better but not normal.   Past Psychiatric Medication Trials: Venlafaxine for hot flashes 75 mg daily  Review of Systems:  Review of Systems  Constitutional:  Negative for appetite change, diaphoresis and unexpected weight change.  Cardiovascular:  Negative for palpitations.  Neurological:  Negative for tremors and weakness.   Medications: I have reviewed the patient's current medications.  Current Outpatient Medications  Medication Sig Dispense Refill   Calcium Citrate-Vitamin D 315-250 MG-UNIT TABS Take twice daily     cetirizine (ZYRTEC) 10 MG tablet Take 10 mg by mouth daily.     Cholecalciferol (VITAMIN D3) 50 MCG (2000  UT) capsule 1 capsule     clonazePAM (KLONOPIN) 1 MG tablet TAKE THREE TABLETS BY MOUTH EVERYDAY AT BEDTIME     denosumab (PROLIA) 60 MG/ML SOSY injection Inject 60 mg into the skin every 6 (six) months.     EPINEPHrine (EPI-PEN) 0.3 mg/0.3 mL DEVI Inject into the muscle as needed.     Estradiol (VAGIFEM) 10 MCG TABS vaginal tablet Place 1 tablet (10 mcg total) vaginally 2 (two) times a week. Use every night before bed for two weeks when you first begin this medicine, then after the first two weeks, begin using it twice a week. 34 tablet 3   famotidine (PEPCID) 20 MG tablet 1 tablet at bedtime as needed     finasteride (PROSCAR) 5 MG tablet 1/2 tablet     hydrOXYzine (ATARAX/VISTARIL) 50 MG tablet Take 1 tablet by mouth 2 (two) times daily.     hyoscyamine (LEVSIN SL) 0.125 MG SL tablet Place 1 tablet under the tongue as needed.     losartan (COZAAR) 50 MG tablet      montelukast (SINGULAIR) 10 MG tablet Take 10 mg by mouth at bedtime.     ondansetron (ZOFRAN-ODT) 4 MG disintegrating tablet Take 1 tablet by mouth as needed.     PRESCRIPTION MEDICATION Take 1 tablet by mouth 2 (two) times daily. Ketotifen '5mg'$  [place]. For mast cell problem  rizatriptan (MAXALT) 10 MG tablet      rosuvastatin (CRESTOR) 20 MG tablet TAKE ONE TABLET BY MOUTH EVERYDAY AT BEDTIME     scopolamine (TRANSDERM-SCOP, 1.5 MG,) 1 MG/3DAYS Place 1 patch (1.5 mg total) onto the skin every 3 (three) days. 10 patch 0   SYNTHROID 100 MCG tablet      tirzepatide (MOUNJARO) 5 MG/0.5ML Pen inject '5mg'$      topiramate (TOPAMAX) 50 MG tablet Take 50 mg by mouth daily.     UNABLE TO FIND 2 capsules 2 (two) times daily. Med Name: AlgaeCal Plus     VASCEPA 1 g CAPS Take 1 capsule by mouth 2 (two) times daily.     venlafaxine XR (EFFEXOR-XR) 37.5 MG 24 hr capsule Take 3 capsules (112.5 mg total) by mouth daily with breakfast. 270 capsule 0   No current facility-administered medications for this visit.     Medication Side Effects: None  Allergies:  Allergies  Allergen Reactions   Lisinopril Other (See Comments)   Demerol Nausea Only    Past Medical History:  Diagnosis Date   Abnormal Pap smear of cervix    Hx of cryotherapy to cervix in her 4s   Abnormal uterine bleeding    history of fibroids   Anemia    Chronic kidney disease    stage 2   Diabetes mellitus without complication ([place])    type 2 diagnosed 3 years ago   DJD (degenerative joint disease)    Fibroid    Heart murmur    functional   History of hypertension NO MEDS SINCE LAP GASTRIC BAND 2010 --  WT. 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  STATES SHE CARRIES EPI PEN--BUT HAS NOT HAD ANY RECENT EPISODES.  STATES MANY OF THE MEDICATIONS SHE TAKES ARE TO HELP THIS PROBLEM - INCLUDING  Ketotifen-mast cell inhibitor-not approved in [place].    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   Please see review of systems for further details on the patient's review from today.   Objective:   Physical Exam:  LMP  (LMP Unknown)   Physical Exam Constitutional:      General: She is not in acute distress. Musculoskeletal:        General: No deformity.  Neurological:     Mental Status: She is alert and oriented to person, place, and time.     Coordination: Coordination normal.  Psychiatric:        Attention and Perception: Attention and perception normal. She does not  perceive auditory or visual hallucinations.        Mood and Affect: Mood is depressed. Mood is not anxious. Affect is blunt. Affect is not labile, angry or inappropriate.        Speech: Speech normal.        Behavior: Behavior normal.        Thought Content: Thought content  normal. Thought content is not paranoid or delusional. Thought content does not include homicidal or suicidal ideation. Thought content does not include suicidal plan.        Cognition and Memory: Cognition and memory normal.        Judgment: Judgment normal.     Comments: Insight intact Affect more blunted than normal for her    Lab Review:     Component Value Date/Time   NA 142 02/20/2018 1200   K 3.4 (L) 02/20/2018 1200   CL 108 02/20/2018 1200   CO2 25 02/20/2018 1200   GLUCOSE 86 02/20/2018 1200   BUN 18 02/20/2018 1200   CREATININE 1.28 (H) 02/20/2018 1200   CALCIUM 9.2 02/20/2018 1200   PROT 6.3 07/28/2008 1030   ALBUMIN 3.8 07/28/2008 1030   AST 38 (H) 07/28/2008 1030   ALT 45 (H) 07/28/2008 1030   ALKPHOS 102 07/28/2008 1030   BILITOT 0.6 07/28/2008 1030   GFRNONAA 43 (L) 02/20/2018 1200   GFRAA 49 (L) 02/20/2018 1200       Component Value Date/Time   WBC 9.2 12/12/2016 1120   RBC 3.69 (L) 12/12/2016 1120   HGB 10.7 (L) 12/12/2016 1120   HCT 33.4 (L) 12/12/2016 1120   PLT 383 12/12/2016 1120   MCV 90.5 12/12/2016 1120   MCH 29.0 12/12/2016 1120   MCHC 32.0 12/12/2016 1120   RDW 14.3 12/12/2016 1120   LYMPHSABS 1.0 05/25/2011 0355   MONOABS 1.5 (H) 05/25/2011 0355   EOSABS 0.0 05/25/2011 0355   BASOSABS 0.0 05/25/2011 0355    No results found for: POCLITH, LITHIUM   No results found for: PHENYTOIN, PHENOBARB, VALPROATE, CBMZ   .res Assessment: Plan:    Gabrielle Lynch was seen today for follow-up.  Diagnoses and all orders for this visit:  Depression, major, single episode, moderate (H[facility]) -     venlafaxine XR (EFFEXOR-XR) 37.5 MG 24 hr capsule; Take 3 capsules (112.5 mg total) by mouth  daily with breakfast.  Greater than 50% of 30 min face to face time with patient was spent on counseling and coordination of care. We discussed Depression is not resolved though some have seen some improvement.  She has not noticed much improvement but also no side effects.  We discussed the dosage range of Effexor  Increase Effexor XR to 150 mg daily.  Disc SE.  None experienced. She has had about 25-30% improvement overall.  Consider alternative Wellbutrin.  Make sure enough protein and explained reasons.  Wt loss stopped.  Call if there is any problems with the dosage increase  Follow-up 4 weeks  Lynder Parents MD, DFAPA   Please see After Visit Summary for patient specific instructions.  Future Appointments  Date Time Provider [place]  10/21/2021  2:00 PM Cottle, [place]., MD [facility] None     No orders of the defined types were placed in this encounter.   -------------------------------

## 2021-10-03 ENCOUNTER — Other Ambulatory Visit: Payer: Self-pay | Admitting: Psychiatry

## 2021-10-03 DIAGNOSIS — F321 Major depressive disorder, single episode, moderate: Secondary | ICD-10-CM

## 2021-10-04 ENCOUNTER — Other Ambulatory Visit: Payer: Self-pay

## 2021-10-04 DIAGNOSIS — F321 Major depressive disorder, single episode, moderate: Secondary | ICD-10-CM

## 2021-10-04 MED ORDER — VENLAFAXINE HCL ER 150 MG PO CP24: 150.0000 mg | ORAL_CAPSULE | Freq: Every day | ORAL | 1 refills | Status: DC

## 2021-10-10 DIAGNOSIS — M8580 Other specified disorders of bone density and structure, unspecified site: Secondary | ICD-10-CM | POA: Diagnosis not present

## 2021-10-10 DIAGNOSIS — E039 Hypothyroidism, unspecified: Secondary | ICD-10-CM | POA: Diagnosis not present

## 2021-10-10 DIAGNOSIS — E782 Mixed hyperlipidemia: Secondary | ICD-10-CM | POA: Diagnosis not present

## 2021-10-10 DIAGNOSIS — I10 Essential (primary) hypertension: Secondary | ICD-10-CM | POA: Diagnosis not present

## 2021-10-18 ENCOUNTER — Other Ambulatory Visit: Payer: Self-pay | Admitting: Psychiatry

## 2021-10-18 DIAGNOSIS — F321 Major depressive disorder, single episode, moderate: Secondary | ICD-10-CM

## 2021-10-21 ENCOUNTER — Ambulatory Visit (INDEPENDENT_AMBULATORY_CARE_PROVIDER_SITE_OTHER): Payer: Medicare Other | Admitting: Psychiatry

## 2021-10-21 ENCOUNTER — Encounter: Payer: Self-pay | Admitting: Psychiatry

## 2021-10-21 DIAGNOSIS — F321 Major depressive disorder, single episode, moderate: Secondary | ICD-10-CM

## 2021-10-21 MED ORDER — VENLAFAXINE HCL ER 150 MG PO CP24: 150.0000 mg | ORAL_CAPSULE | Freq: Every day | ORAL | 3 refills | Status: DC

## 2021-11-04 DIAGNOSIS — Z77120 Contact with and (suspected) exposure to mold (toxic): Secondary | ICD-10-CM | POA: Diagnosis not present

## 2021-11-04 DIAGNOSIS — K5989 Other specified functional intestinal disorders: Secondary | ICD-10-CM | POA: Diagnosis not present

## 2021-11-08 DIAGNOSIS — E039 Hypothyroidism, unspecified: Secondary | ICD-10-CM | POA: Diagnosis not present

## 2021-11-08 DIAGNOSIS — I10 Essential (primary) hypertension: Secondary | ICD-10-CM | POA: Diagnosis not present

## 2021-11-08 DIAGNOSIS — N183 Chronic kidney disease, stage 3 unspecified: Secondary | ICD-10-CM | POA: Diagnosis not present

## 2021-11-08 DIAGNOSIS — E119 Type 2 diabetes mellitus without complications: Secondary | ICD-10-CM | POA: Diagnosis not present

## 2021-11-08 DIAGNOSIS — E782 Mixed hyperlipidemia: Secondary | ICD-10-CM | POA: Diagnosis not present

## 2021-11-14 DIAGNOSIS — G729 Myopathy, unspecified: Secondary | ICD-10-CM | POA: Diagnosis not present

## 2021-11-14 DIAGNOSIS — E1129 Type 2 diabetes mellitus with other diabetic kidney complication: Secondary | ICD-10-CM | POA: Diagnosis not present

## 2021-11-14 DIAGNOSIS — E559 Vitamin D deficiency, unspecified: Secondary | ICD-10-CM | POA: Diagnosis not present

## 2021-11-14 DIAGNOSIS — D808 Other immunodeficiencies with predominantly antibody defects: Secondary | ICD-10-CM | POA: Diagnosis not present

## 2021-11-14 DIAGNOSIS — E785 Hyperlipidemia, unspecified: Secondary | ICD-10-CM | POA: Diagnosis not present

## 2021-11-14 DIAGNOSIS — R87610 Atypical squamous cells of undetermined significance on cytologic smear of cervix (ASC-US): Secondary | ICD-10-CM | POA: Diagnosis not present

## 2021-11-14 DIAGNOSIS — E039 Hypothyroidism, unspecified: Secondary | ICD-10-CM | POA: Diagnosis not present

## 2021-11-14 DIAGNOSIS — I129 Hypertensive chronic kidney disease with stage 1 through stage 4 chronic kidney disease, or unspecified chronic kidney disease: Secondary | ICD-10-CM | POA: Diagnosis not present

## 2021-11-14 DIAGNOSIS — Z9884 Bariatric surgery status: Secondary | ICD-10-CM | POA: Diagnosis not present

## 2021-11-14 DIAGNOSIS — N1832 Chronic kidney disease, stage 3b: Secondary | ICD-10-CM | POA: Diagnosis not present

## 2021-11-14 DIAGNOSIS — Z4681 Encounter for fitting and adjustment of insulin pump: Secondary | ICD-10-CM | POA: Diagnosis not present

## 2021-11-14 DIAGNOSIS — D649 Anemia, unspecified: Secondary | ICD-10-CM | POA: Diagnosis not present

## 2021-11-30 DIAGNOSIS — M353 Polymyalgia rheumatica: Secondary | ICD-10-CM | POA: Diagnosis not present

## 2021-11-30 DIAGNOSIS — D649 Anemia, unspecified: Secondary | ICD-10-CM | POA: Diagnosis not present

## 2021-11-30 DIAGNOSIS — Z1331 Encounter for screening for depression: Secondary | ICD-10-CM | POA: Diagnosis not present

## 2021-11-30 DIAGNOSIS — E039 Hypothyroidism, unspecified: Secondary | ICD-10-CM | POA: Diagnosis not present

## 2021-11-30 DIAGNOSIS — M8580 Other specified disorders of bone density and structure, unspecified site: Secondary | ICD-10-CM | POA: Diagnosis not present

## 2021-11-30 DIAGNOSIS — K581 Irritable bowel syndrome with constipation: Secondary | ICD-10-CM | POA: Diagnosis not present

## 2021-11-30 DIAGNOSIS — E7800 Pure hypercholesterolemia, unspecified: Secondary | ICD-10-CM | POA: Diagnosis not present

## 2021-11-30 DIAGNOSIS — E119 Type 2 diabetes mellitus without complications: Secondary | ICD-10-CM | POA: Diagnosis not present

## 2021-11-30 DIAGNOSIS — E538 Deficiency of other specified B group vitamins: Secondary | ICD-10-CM | POA: Diagnosis not present

## 2021-11-30 DIAGNOSIS — G43009 Migraine without aura, not intractable, without status migrainosus: Secondary | ICD-10-CM | POA: Diagnosis not present

## 2021-11-30 DIAGNOSIS — N183 Chronic kidney disease, stage 3 unspecified: Secondary | ICD-10-CM | POA: Diagnosis not present

## 2021-11-30 DIAGNOSIS — E559 Vitamin D deficiency, unspecified: Secondary | ICD-10-CM | POA: Diagnosis not present

## 2021-11-30 DIAGNOSIS — I10 Essential (primary) hypertension: Secondary | ICD-10-CM | POA: Diagnosis not present

## 2021-11-30 DIAGNOSIS — Q822 Mastocytosis: Secondary | ICD-10-CM | POA: Diagnosis not present

## 2021-11-30 DIAGNOSIS — Z0000 Encounter for general adult medical examination without abnormal findings: Secondary | ICD-10-CM | POA: Diagnosis not present

## 2021-12-02 DIAGNOSIS — R748 Abnormal levels of other serum enzymes: Secondary | ICD-10-CM | POA: Diagnosis not present

## 2021-12-02 DIAGNOSIS — R945 Abnormal results of liver function studies: Secondary | ICD-10-CM | POA: Diagnosis not present

## 2021-12-07 DIAGNOSIS — N183 Chronic kidney disease, stage 3 unspecified: Secondary | ICD-10-CM | POA: Diagnosis not present

## 2021-12-07 DIAGNOSIS — E039 Hypothyroidism, unspecified: Secondary | ICD-10-CM | POA: Diagnosis not present

## 2021-12-07 DIAGNOSIS — E782 Mixed hyperlipidemia: Secondary | ICD-10-CM | POA: Diagnosis not present

## 2021-12-07 DIAGNOSIS — I10 Essential (primary) hypertension: Secondary | ICD-10-CM | POA: Diagnosis not present

## 2021-12-07 DIAGNOSIS — E119 Type 2 diabetes mellitus without complications: Secondary | ICD-10-CM | POA: Diagnosis not present

## 2021-12-07 DIAGNOSIS — G43009 Migraine without aura, not intractable, without status migrainosus: Secondary | ICD-10-CM | POA: Diagnosis not present

## 2021-12-08 DIAGNOSIS — R748 Abnormal levels of other serum enzymes: Secondary | ICD-10-CM | POA: Diagnosis not present

## 2022-01-06 DIAGNOSIS — E039 Hypothyroidism, unspecified: Secondary | ICD-10-CM | POA: Diagnosis not present

## 2022-01-06 DIAGNOSIS — I10 Essential (primary) hypertension: Secondary | ICD-10-CM | POA: Diagnosis not present

## 2022-01-06 DIAGNOSIS — N183 Chronic kidney disease, stage 3 unspecified: Secondary | ICD-10-CM | POA: Diagnosis not present

## 2022-01-06 DIAGNOSIS — G43009 Migraine without aura, not intractable, without status migrainosus: Secondary | ICD-10-CM | POA: Diagnosis not present

## 2022-01-06 DIAGNOSIS — E119 Type 2 diabetes mellitus without complications: Secondary | ICD-10-CM | POA: Diagnosis not present

## 2022-01-06 DIAGNOSIS — M8580 Other specified disorders of bone density and structure, unspecified site: Secondary | ICD-10-CM | POA: Diagnosis not present

## 2022-01-06 DIAGNOSIS — E782 Mixed hyperlipidemia: Secondary | ICD-10-CM | POA: Diagnosis not present

## 2022-01-19 DIAGNOSIS — Z23 Encounter for immunization: Secondary | ICD-10-CM | POA: Diagnosis not present

## 2022-01-23 DIAGNOSIS — M25572 Pain in left ankle and joints of left foot: Secondary | ICD-10-CM | POA: Diagnosis not present

## 2022-02-02 ENCOUNTER — Ambulatory Visit: Payer: Medicare Other | Admitting: Psychiatry

## 2022-02-02 DIAGNOSIS — M19071 Primary osteoarthritis, right ankle and foot: Secondary | ICD-10-CM | POA: Diagnosis not present

## 2022-02-06 DIAGNOSIS — E782 Mixed hyperlipidemia: Secondary | ICD-10-CM | POA: Diagnosis not present

## 2022-02-06 DIAGNOSIS — G43009 Migraine without aura, not intractable, without status migrainosus: Secondary | ICD-10-CM | POA: Diagnosis not present

## 2022-02-06 DIAGNOSIS — I10 Essential (primary) hypertension: Secondary | ICD-10-CM | POA: Diagnosis not present

## 2022-02-06 DIAGNOSIS — E119 Type 2 diabetes mellitus without complications: Secondary | ICD-10-CM | POA: Diagnosis not present

## 2022-02-06 DIAGNOSIS — N183 Chronic kidney disease, stage 3 unspecified: Secondary | ICD-10-CM | POA: Diagnosis not present

## 2022-02-06 DIAGNOSIS — E039 Hypothyroidism, unspecified: Secondary | ICD-10-CM | POA: Diagnosis not present

## 2022-02-08 DIAGNOSIS — M810 Age-related osteoporosis without current pathological fracture: Secondary | ICD-10-CM | POA: Diagnosis not present

## 2022-02-08 DIAGNOSIS — S8002XA Contusion of left knee, initial encounter: Secondary | ICD-10-CM | POA: Diagnosis not present

## 2022-02-09 DIAGNOSIS — Z9884 Bariatric surgery status: Secondary | ICD-10-CM | POA: Diagnosis not present

## 2022-02-09 DIAGNOSIS — R5383 Other fatigue: Secondary | ICD-10-CM | POA: Diagnosis not present

## 2022-02-09 DIAGNOSIS — R768 Other specified abnormal immunological findings in serum: Secondary | ICD-10-CM | POA: Diagnosis not present

## 2022-02-09 DIAGNOSIS — E538 Deficiency of other specified B group vitamins: Secondary | ICD-10-CM | POA: Diagnosis not present

## 2022-02-09 DIAGNOSIS — R779 Abnormality of plasma protein, unspecified: Secondary | ICD-10-CM | POA: Diagnosis not present

## 2022-02-09 DIAGNOSIS — E7800 Pure hypercholesterolemia, unspecified: Secondary | ICD-10-CM | POA: Diagnosis not present

## 2022-02-09 DIAGNOSIS — E038 Other specified hypothyroidism: Secondary | ICD-10-CM | POA: Diagnosis not present

## 2022-02-09 DIAGNOSIS — N189 Chronic kidney disease, unspecified: Secondary | ICD-10-CM | POA: Diagnosis not present

## 2022-02-09 DIAGNOSIS — D649 Anemia, unspecified: Secondary | ICD-10-CM | POA: Diagnosis not present

## 2022-02-09 DIAGNOSIS — R2991 Unspecified symptoms and signs involving the musculoskeletal system: Secondary | ICD-10-CM | POA: Diagnosis not present

## 2022-02-16 DIAGNOSIS — Z9884 Bariatric surgery status: Secondary | ICD-10-CM | POA: Diagnosis not present

## 2022-03-02 ENCOUNTER — Other Ambulatory Visit: Payer: Self-pay | Admitting: Psychiatry

## 2022-03-02 DIAGNOSIS — F321 Major depressive disorder, single episode, moderate: Secondary | ICD-10-CM

## 2022-03-06 DIAGNOSIS — R748 Abnormal levels of other serum enzymes: Secondary | ICD-10-CM | POA: Diagnosis not present

## 2022-03-06 DIAGNOSIS — M545 Low back pain, unspecified: Secondary | ICD-10-CM | POA: Diagnosis not present

## 2022-03-07 DIAGNOSIS — Z1211 Encounter for screening for malignant neoplasm of colon: Secondary | ICD-10-CM | POA: Diagnosis not present

## 2022-03-07 DIAGNOSIS — Z1212 Encounter for screening for malignant neoplasm of rectum: Secondary | ICD-10-CM | POA: Diagnosis not present

## 2022-03-08 ENCOUNTER — Other Ambulatory Visit: Payer: Self-pay | Admitting: Obstetrics and Gynecology

## 2022-03-08 DIAGNOSIS — N1832 Chronic kidney disease, stage 3b: Secondary | ICD-10-CM | POA: Diagnosis not present

## 2022-03-08 DIAGNOSIS — E785 Hyperlipidemia, unspecified: Secondary | ICD-10-CM | POA: Diagnosis not present

## 2022-03-08 DIAGNOSIS — I129 Hypertensive chronic kidney disease with stage 1 through stage 4 chronic kidney disease, or unspecified chronic kidney disease: Secondary | ICD-10-CM | POA: Diagnosis not present

## 2022-03-08 DIAGNOSIS — E1129 Type 2 diabetes mellitus with other diabetic kidney complication: Secondary | ICD-10-CM | POA: Diagnosis not present

## 2022-03-08 DIAGNOSIS — Z1231 Encounter for screening mammogram for malignant neoplasm of breast: Secondary | ICD-10-CM

## 2022-03-09 DIAGNOSIS — I10 Essential (primary) hypertension: Secondary | ICD-10-CM | POA: Diagnosis not present

## 2022-03-09 DIAGNOSIS — G43009 Migraine without aura, not intractable, without status migrainosus: Secondary | ICD-10-CM | POA: Diagnosis not present

## 2022-03-09 DIAGNOSIS — E119 Type 2 diabetes mellitus without complications: Secondary | ICD-10-CM | POA: Diagnosis not present

## 2022-03-09 DIAGNOSIS — E782 Mixed hyperlipidemia: Secondary | ICD-10-CM | POA: Diagnosis not present

## 2022-03-09 DIAGNOSIS — E039 Hypothyroidism, unspecified: Secondary | ICD-10-CM | POA: Diagnosis not present

## 2022-03-09 DIAGNOSIS — N183 Chronic kidney disease, stage 3 unspecified: Secondary | ICD-10-CM | POA: Diagnosis not present

## 2022-03-15 DIAGNOSIS — M47896 Other spondylosis, lumbar region: Secondary | ICD-10-CM | POA: Diagnosis not present

## 2022-03-15 DIAGNOSIS — M6281 Muscle weakness (generalized): Secondary | ICD-10-CM | POA: Diagnosis not present

## 2022-03-15 LAB — COLOGUARD: COLOGUARD: NEGATIVE

## 2022-03-27 DIAGNOSIS — N1832 Chronic kidney disease, stage 3b: Secondary | ICD-10-CM | POA: Diagnosis not present

## 2022-03-27 DIAGNOSIS — D47Z9 Other specified neoplasms of uncertain behavior of lymphoid, hematopoietic and related tissue: Secondary | ICD-10-CM | POA: Diagnosis not present

## 2022-03-27 DIAGNOSIS — I129 Hypertensive chronic kidney disease with stage 1 through stage 4 chronic kidney disease, or unspecified chronic kidney disease: Secondary | ICD-10-CM | POA: Diagnosis not present

## 2022-03-27 DIAGNOSIS — Z4681 Encounter for fitting and adjustment of insulin pump: Secondary | ICD-10-CM | POA: Diagnosis not present

## 2022-03-27 DIAGNOSIS — E785 Hyperlipidemia, unspecified: Secondary | ICD-10-CM | POA: Diagnosis not present

## 2022-03-27 DIAGNOSIS — D808 Other immunodeficiencies with predominantly antibody defects: Secondary | ICD-10-CM | POA: Diagnosis not present

## 2022-03-27 DIAGNOSIS — M353 Polymyalgia rheumatica: Secondary | ICD-10-CM | POA: Diagnosis not present

## 2022-03-27 DIAGNOSIS — I712 Thoracic aortic aneurysm, without rupture, unspecified: Secondary | ICD-10-CM | POA: Diagnosis not present

## 2022-03-27 DIAGNOSIS — F330 Major depressive disorder, recurrent, mild: Secondary | ICD-10-CM | POA: Diagnosis not present

## 2022-03-27 DIAGNOSIS — E1129 Type 2 diabetes mellitus with other diabetic kidney complication: Secondary | ICD-10-CM | POA: Diagnosis not present

## 2022-03-27 DIAGNOSIS — G729 Myopathy, unspecified: Secondary | ICD-10-CM | POA: Diagnosis not present

## 2022-03-27 DIAGNOSIS — Z9884 Bariatric surgery status: Secondary | ICD-10-CM | POA: Diagnosis not present

## 2022-03-28 ENCOUNTER — Encounter: Payer: Self-pay | Admitting: Psychiatry

## 2022-03-28 ENCOUNTER — Ambulatory Visit (INDEPENDENT_AMBULATORY_CARE_PROVIDER_SITE_OTHER): Payer: Medicare Other | Admitting: Psychiatry

## 2022-03-28 DIAGNOSIS — L814 Other melanin hyperpigmentation: Secondary | ICD-10-CM | POA: Diagnosis not present

## 2022-03-28 DIAGNOSIS — D692 Other nonthrombocytopenic purpura: Secondary | ICD-10-CM | POA: Diagnosis not present

## 2022-03-28 DIAGNOSIS — L570 Actinic keratosis: Secondary | ICD-10-CM | POA: Diagnosis not present

## 2022-03-28 DIAGNOSIS — F321 Major depressive disorder, single episode, moderate: Secondary | ICD-10-CM

## 2022-03-28 DIAGNOSIS — H61002 Unspecified perichondritis of left external ear: Secondary | ICD-10-CM | POA: Diagnosis not present

## 2022-03-28 DIAGNOSIS — D2272 Melanocytic nevi of left lower limb, including hip: Secondary | ICD-10-CM | POA: Diagnosis not present

## 2022-03-28 DIAGNOSIS — D1801 Hemangioma of skin and subcutaneous tissue: Secondary | ICD-10-CM | POA: Diagnosis not present

## 2022-03-28 DIAGNOSIS — L821 Other seborrheic keratosis: Secondary | ICD-10-CM | POA: Diagnosis not present

## 2022-03-28 DIAGNOSIS — L853 Xerosis cutis: Secondary | ICD-10-CM | POA: Diagnosis not present

## 2022-03-28 DIAGNOSIS — D2271 Melanocytic nevi of right lower limb, including hip: Secondary | ICD-10-CM | POA: Diagnosis not present

## 2022-03-28 MED ORDER — VENLAFAXINE HCL ER 37.5 MG PO CP24: ORAL_CAPSULE | ORAL | 1 refills | Status: DC

## 2022-03-28 NOTE — Progress Notes (Signed)
Gabrielle Lynch [id number] 1950/05/12 71 y.o.  Subjective:   Patient ID:  Gabrielle Lynch is a 71 y.o. (DOB October 02, 1950) female.  Chief Complaint:  Chief Complaint  Patient presents with   Follow-up   Depression    HPI Gabrielle Lynch presents to the office today for follow-up of major depression.  She was first seen 07/08/2021 upon referral from Dr. Cheryln Manly.  Venlafaxine XR 37.5 mg was added and increased to 75 mg daily.  She has been on clonazepam for a number of years for sleep. . 08/30/21 appt noted: Not noticed much change but another person remarked about seeing the old [place].  A couple of people noticed she was more active and outgoing.  Not feeling bad.  Still a lack of enjoyment.   Stopped losing weight.  Not eating as usual. Has lost 50+#.  Try to eat enough protein.  Eats meat. On Effexor XR 75 mg daily. No SE. Looking forward to celebrating 71th birthday upcoming.  Plan: Increase Effexor XR 112.5 mg daily.  09/16/21 appt noted: I think it might be helping a little.  Going on a trip for 3 weeks. Leaving Sunday.  No SE.   No profound sadness but still some lack of enjoyment but not as bad as it was.  But could be better. No sign anxiety.  Sleep is fine with 2 mg clonazepam HS. Energy a little better but not normal.  Concentration ok.  Motivation a little better but not normal. Plan: Increase Effexor XR to 150 mg daily.   10/21/21 appt noted: Some improvement, better genreally. Thinks she's out of depression.    Busy time has helped.  Doing a lot.  Not hiding in home anymore.  Doing things at home again.   No SE. Sleep is good.  Energy good.  Appetite is so so, but nothing appeals.  Lost sense of taste and smell from Covid.  Weight stable.  Did enjoy food in [place].   Satisfied with response.   Continue Effexor xr 150 mg without change  03/28/22 appt noted: Doing great since here.  Asks about reducing the Effexor.  She wants to wean off meds.  Has been out of depression  for 6 mos.   No sig depression.  No anxiety.  Sleep is ok. No unusual stressors.  No health problems.  Past Psychiatric Medication Trials: Venlafaxine for hot flashes 75 mg daily  Review of Systems:  Review of Systems  Constitutional:  Negative for appetite change, diaphoresis and unexpected weight change.  Cardiovascular:  Negative for palpitations.  Neurological:  Negative for tremors.    Medications: I have reviewed the patient's current medications.  Current Outpatient Medications  Medication Sig Dispense Refill   Calcium Citrate-Vitamin D 315-250 MG-UNIT TABS Take twice daily     cetirizine (ZYRTEC) 10 MG tablet Take 10 mg by mouth daily.     Cholecalciferol (VITAMIN D3) 50 MCG (2000 UT) capsule 1 capsule     clonazePAM (KLONOPIN) 1 MG tablet 2 tabs     denosumab (PROLIA) 60 MG/ML SOSY injection Inject 60 mg into the skin every 6 (six) months.     EPINEPHrine (EPI-PEN) 0.3 mg/0.3 mL DEVI Inject into the muscle as needed.     Estradiol (VAGIFEM) 10 MCG TABS vaginal tablet Place 1 tablet (10 mcg total) vaginally 2 (two) times a week. Use every night before bed for two weeks when you first begin this medicine, then after the first two weeks, begin using it twice a  week. 34 tablet 3   famotidine (PEPCID) 20 MG tablet 1 tablet at bedtime as needed     finasteride (PROSCAR) 5 MG tablet 1/2 tablet     hydrOXYzine (ATARAX/VISTARIL) 50 MG tablet Take 1 tablet by mouth 2 (two) times daily.     hyoscyamine (LEVSIN SL) 0.125 MG SL tablet Place 1 tablet under the tongue as needed.     losartan (COZAAR) 50 MG tablet      montelukast (SINGULAIR) 10 MG tablet Take 10 mg by mouth at bedtime.     ondansetron (ZOFRAN-ODT) 4 MG disintegrating tablet Take 1 tablet by mouth as needed.     PRESCRIPTION MEDICATION Take 1 tablet by mouth 2 (two) times daily. Ketotifen '5mg'$  tablet[place]. For mast cell problem     rizatriptan (MAXALT) 10 MG tablet      rosuvastatin (CRESTOR) 20 MG  tablet TAKE ONE TABLET BY MOUTH EVERYDAY AT BEDTIME     scopolamine (TRANSDERM-SCOP, 1.5 MG,) 1 MG/3DAYS Place 1 patch (1.5 mg total) onto the skin every 3 (three) days. 10 patch 0   SYNTHROID 100 MCG tablet      tirzepatide (MOUNJARO) 5 MG/0.5ML Pen inject '5mg'$      UNABLE TO FIND 2 capsules 2 (two) times daily. Med Name: AlgaeCal Plus     VASCEPA 1 g CAPS Take 1 capsule by mouth 2 (two) times daily.     topiramate (TOPAMAX) 50 MG tablet Take 50 mg by mouth daily. (Patient not taking: Reported on 03/28/2022)     venlafaxine XR (EFFEXOR-XR) 37.5 MG 24 hr capsule 3 daily for 1 month then 2 daily for 1 month then 1 daily for 1 month then stop it. 90 capsule 1   No current facility-administered medications for this visit.    Medication Side Effects: None  Allergies:  Allergies  Allergen Reactions   Lisinopril Other (See Comments)   Demerol Nausea Only    Past Medical History:  Diagnosis Date   Abnormal Pap smear of cervix    Hx of cryotherapy to cervix in her 8s   Abnormal uterine bleeding    history of fibroids   Anemia    Chronic kidney disease    stage 2   Diabetes mellitus without complication ([place])    type 2 diagnosed 3 years ago   DJD (degenerative joint disease)    Fibroid    Heart murmur    functional   History of hypertension NO MEDS SINCE LAP GASTRIC BAND 2010 --  WT. 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  STATES SHE CARRIES EPI PEN--BUT HAS NOT HAD ANY RECENT EPISODES.  STATES MANY OF THE MEDICATIONS SHE TAKES ARE TO HELP THIS PROBLEM - INCLUDING  Ketotifen-mast cell inhibitor-not approved in [place].    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   Please see review of systems for further  details on the patient's review from today.   Objective:   Physical Exam:  LMP  (LMP Unknown)   Physical Exam Constitutional:      General: She is not in acute distress. Musculoskeletal:        General: No deformity.  Neurological:     Mental Status: She is alert and oriented to person, place, and time.     Coordination: Coordination normal.  Psychiatric:        Attention and Perception: Attention and perception normal. She does not perceive auditory or visual hallucinations.        Mood and Affect: Mood is not anxious or depressed. Affect is not labile, blunt, angry or inappropriate.        Speech: Speech normal.        Behavior: Behavior normal.        Thought Content: Thought content normal. Thought content is not paranoid or delusional. Thought content does not include homicidal or suicidal ideation. Thought content does not include suicidal plan.        Cognition and Memory: Cognition and memory normal.        Judgment: Judgment normal.     Comments: Insight intact      Lab Review:     Component Value Date/Time   NA 142 02/20/2018 1200   K 3.4 (L) 02/20/2018 1200   CL 108 02/20/2018 1200   CO2 25 02/20/2018 1200   GLUCOSE 86 02/20/2018 1200   BUN 18 02/20/2018 1200   CREATININE 1.28 (H) 02/20/2018 1200   CALCIUM 9.2 02/20/2018 1200   PROT 6.3 07/28/2008 1030   ALBUMIN 3.8 07/28/2008 1030   AST 38 (H) 07/28/2008 1030   ALT 45 (H) 07/28/2008 1030   ALKPHOS 102 07/28/2008 1030   BILITOT 0.6 07/28/2008 1030   GFRNONAA 43 (L) 02/20/2018 1200   GFRAA 49 (L) 02/20/2018 1200       Component Value Date/Time   WBC 9.2 12/12/2016 1120   RBC 3.69 (L) 12/12/2016 1120   HGB 10.7 (L) 12/12/2016  1120   HCT 33.4 (L) 12/12/2016 1120   PLT 383 12/12/2016 1120   MCV 90.5 12/12/2016 1120   MCH 29.0 12/12/2016 1120   MCHC 32.0 12/12/2016 1120   RDW 14.3 12/12/2016 1120   LYMPHSABS 1.0 05/25/2011 0355   MONOABS 1.5 (H) 05/25/2011 0355   EOSABS 0.0 05/25/2011 0355   BASOSABS 0.0 05/25/2011 0355    No results found for: "POCLITH", "LITHIUM"   No results found for: "PHENYTOIN", "PHENOBARB", "VALPROATE", "CBMZ"   .res Assessment: Plan:    Damiyah was seen today for follow-up and depression.  Diagnoses and all orders for this visit:  Depression, major, single episode, moderate (HCC) -     venlafaxine XR (EFFEXOR-XR) 37.5 MG 24 hr capsule; 3 daily for 1 month then 2 daily for 1 month then 1 daily for 1 month then stop it.   Greater than 50% of 15 min face to face time with patient was spent on counseling and coordination of care.  Fortunately she feels her depression is largely resolved at this point and is satisfied with the results.  Given that she does not have a long history of depression or repeated episodes of depression and it is reasonable to attempt to wean off the venlafaxine now that she has been symptom free for several months.  Wean Effexor XR over 3 months Discussed the risk of relapse and she will let us know if this occurs.  Discussed that is  not necessary to urine during her SSRI withdrawal and their ways to solve the problem should she have it.  Follow-up 3 mos  Lynder Parents MD, DFAPA   Please see After Visit Summary for patient specific instructions.  Future Appointments  Date Time Provider [place]  05/24/2022  1:30 PM [facility] 2 [facility]     No orders of the defined types were placed in this encounter.   -------------------------------

## 2022-04-06 DIAGNOSIS — M8580 Other specified disorders of bone density and structure, unspecified site: Secondary | ICD-10-CM | POA: Diagnosis not present

## 2022-04-06 DIAGNOSIS — I10 Essential (primary) hypertension: Secondary | ICD-10-CM | POA: Diagnosis not present

## 2022-04-06 DIAGNOSIS — E039 Hypothyroidism, unspecified: Secondary | ICD-10-CM | POA: Diagnosis not present

## 2022-04-06 DIAGNOSIS — N183 Chronic kidney disease, stage 3 unspecified: Secondary | ICD-10-CM | POA: Diagnosis not present

## 2022-04-06 DIAGNOSIS — E119 Type 2 diabetes mellitus without complications: Secondary | ICD-10-CM | POA: Diagnosis not present

## 2022-04-06 DIAGNOSIS — E782 Mixed hyperlipidemia: Secondary | ICD-10-CM | POA: Diagnosis not present

## 2022-04-10 ENCOUNTER — Other Ambulatory Visit: Payer: Self-pay | Admitting: Psychiatry

## 2022-04-10 DIAGNOSIS — F321 Major depressive disorder, single episode, moderate: Secondary | ICD-10-CM

## 2022-04-11 ENCOUNTER — Other Ambulatory Visit: Payer: Self-pay | Admitting: Psychiatry

## 2022-04-11 DIAGNOSIS — F321 Major depressive disorder, single episode, moderate: Secondary | ICD-10-CM

## 2022-04-11 NOTE — Telephone Encounter (Signed)
Have started taper and given another RX for lower dose.

## 2022-05-24 ENCOUNTER — Ambulatory Visit
Admission: RE | Admit: 2022-05-24 | Discharge: 2022-05-24 | Disposition: A | Discharge status: Home / Self Care | Payer: Medicare Other | Source: Ambulatory Visit | Attending: Obstetrics and Gynecology | Admitting: Obstetrics and Gynecology

## 2022-05-24 DIAGNOSIS — R748 Abnormal levels of other serum enzymes: Secondary | ICD-10-CM | POA: Diagnosis not present

## 2022-05-24 DIAGNOSIS — Z1231 Encounter for screening mammogram for malignant neoplasm of breast: Secondary | ICD-10-CM

## 2022-05-24 DIAGNOSIS — E7800 Pure hypercholesterolemia, unspecified: Secondary | ICD-10-CM | POA: Diagnosis not present

## 2022-05-29 DIAGNOSIS — I10 Essential (primary) hypertension: Secondary | ICD-10-CM | POA: Diagnosis not present

## 2022-05-29 DIAGNOSIS — E119 Type 2 diabetes mellitus without complications: Secondary | ICD-10-CM | POA: Diagnosis not present

## 2022-05-29 DIAGNOSIS — N183 Chronic kidney disease, stage 3 unspecified: Secondary | ICD-10-CM | POA: Diagnosis not present

## 2022-05-29 DIAGNOSIS — E039 Hypothyroidism, unspecified: Secondary | ICD-10-CM | POA: Diagnosis not present

## 2022-05-29 DIAGNOSIS — M8580 Other specified disorders of bone density and structure, unspecified site: Secondary | ICD-10-CM | POA: Diagnosis not present

## 2022-05-29 DIAGNOSIS — E7800 Pure hypercholesterolemia, unspecified: Secondary | ICD-10-CM | POA: Diagnosis not present

## 2022-06-02 DIAGNOSIS — H02831 Dermatochalasis of right upper eyelid: Secondary | ICD-10-CM | POA: Diagnosis not present

## 2022-06-02 DIAGNOSIS — H02835 Dermatochalasis of left lower eyelid: Secondary | ICD-10-CM | POA: Diagnosis not present

## 2022-06-02 DIAGNOSIS — H02834 Dermatochalasis of left upper eyelid: Secondary | ICD-10-CM | POA: Diagnosis not present

## 2022-06-02 DIAGNOSIS — Z01812 Encounter for preprocedural laboratory examination: Secondary | ICD-10-CM | POA: Diagnosis not present

## 2022-06-02 DIAGNOSIS — H02832 Dermatochalasis of right lower eyelid: Secondary | ICD-10-CM | POA: Diagnosis not present

## 2022-06-02 DIAGNOSIS — Z01810 Encounter for preprocedural cardiovascular examination: Secondary | ICD-10-CM | POA: Diagnosis not present

## 2022-06-02 DIAGNOSIS — I129 Hypertensive chronic kidney disease with stage 1 through stage 4 chronic kidney disease, or unspecified chronic kidney disease: Secondary | ICD-10-CM | POA: Diagnosis not present

## 2022-06-02 DIAGNOSIS — N183 Chronic kidney disease, stage 3 unspecified: Secondary | ICD-10-CM | POA: Diagnosis not present

## 2022-06-05 DIAGNOSIS — I444 Left anterior fascicular block: Secondary | ICD-10-CM | POA: Diagnosis not present

## 2022-06-06 DIAGNOSIS — H02835 Dermatochalasis of left lower eyelid: Secondary | ICD-10-CM | POA: Diagnosis not present

## 2022-06-06 DIAGNOSIS — H02832 Dermatochalasis of right lower eyelid: Secondary | ICD-10-CM | POA: Diagnosis not present

## 2022-06-06 DIAGNOSIS — H02831 Dermatochalasis of right upper eyelid: Secondary | ICD-10-CM | POA: Diagnosis not present

## 2022-06-06 DIAGNOSIS — H02834 Dermatochalasis of left upper eyelid: Secondary | ICD-10-CM | POA: Diagnosis not present

## 2022-06-06 DIAGNOSIS — H57813 Brow ptosis, bilateral: Secondary | ICD-10-CM | POA: Diagnosis not present

## 2022-06-22 ENCOUNTER — Telehealth: Payer: Self-pay | Admitting: Internal Medicine

## 2022-06-22 NOTE — Telephone Encounter (Signed)
Good Afternoon Dr. Henrene Pastor,  I have received a call from this patient wishing to schedule an appointment with you, states she has been having severe constipation and is looking for treatment and to possibly discuss a colonoscopy. Patient has history with [facility], wishing to transfer her care over to you due to her previous GI provider retiring. She says a close friend of hers is a patient of yours and he highly recommended her to you. Records are available to view via Care Everywhere, please review them at your earliest convenience and advise on scheduling.  Thank You!

## 2022-06-22 NOTE — Telephone Encounter (Signed)
Ok to schedule an appointment with me. Thanks

## 2022-06-27 ENCOUNTER — Encounter: Payer: Self-pay | Admitting: Internal Medicine

## 2022-06-27 NOTE — Telephone Encounter (Signed)
OV scheduled 4/22 at 3:00

## 2022-06-28 ENCOUNTER — Other Ambulatory Visit (HOSPITAL_COMMUNITY): Payer: Self-pay

## 2022-06-28 ENCOUNTER — Other Ambulatory Visit: Payer: Self-pay

## 2022-06-28 ENCOUNTER — Ambulatory Visit: Payer: Medicare Other | Admitting: Psychiatry

## 2022-06-28 DIAGNOSIS — N1832 Chronic kidney disease, stage 3b: Secondary | ICD-10-CM | POA: Diagnosis not present

## 2022-06-28 DIAGNOSIS — E039 Hypothyroidism, unspecified: Secondary | ICD-10-CM | POA: Diagnosis not present

## 2022-06-28 DIAGNOSIS — E785 Hyperlipidemia, unspecified: Secondary | ICD-10-CM | POA: Diagnosis not present

## 2022-06-28 DIAGNOSIS — I129 Hypertensive chronic kidney disease with stage 1 through stage 4 chronic kidney disease, or unspecified chronic kidney disease: Secondary | ICD-10-CM | POA: Diagnosis not present

## 2022-06-28 DIAGNOSIS — E1129 Type 2 diabetes mellitus with other diabetic kidney complication: Secondary | ICD-10-CM | POA: Diagnosis not present

## 2022-06-28 DIAGNOSIS — F321 Major depressive disorder, single episode, moderate: Secondary | ICD-10-CM

## 2022-06-28 MED ORDER — MOUNJARO 7.5 MG/0.5ML ~~LOC~~ SOAJ
7.5000 mg | SUBCUTANEOUS | 11 refills | Status: DC
  Filled 2022-06-28: qty 2, 28d supply, fill #0
  Filled 2022-07-14: qty 2, 28d supply, fill #1

## 2022-06-28 NOTE — Progress Notes (Signed)
Called twice and went to VM

## 2022-06-29 DIAGNOSIS — M85851 Other specified disorders of bone density and structure, right thigh: Secondary | ICD-10-CM | POA: Diagnosis not present

## 2022-06-29 DIAGNOSIS — M85852 Other specified disorders of bone density and structure, left thigh: Secondary | ICD-10-CM | POA: Diagnosis not present

## 2022-06-29 DIAGNOSIS — Z780 Asymptomatic menopausal state: Secondary | ICD-10-CM | POA: Diagnosis not present

## 2022-07-03 ENCOUNTER — Ambulatory Visit (INDEPENDENT_AMBULATORY_CARE_PROVIDER_SITE_OTHER): Payer: Medicare Other | Admitting: Psychology

## 2022-07-03 DIAGNOSIS — F321 Major depressive disorder, single episode, moderate: Secondary | ICD-10-CM | POA: Diagnosis not present

## 2022-07-03 NOTE — Progress Notes (Addendum)
                Progress Note Date 07/03/2022  Diagnosis 34.89 Symptoms Anxiety/depression Medication Status Effexor 75mg   Safety none  If Suicidal or Homicidal State Action Taken: unspecified  Current Risk: low Medications none Objectives unspecified Client Response full compliance  Service Location Location, [address] [place], [place] [address]  Service Code cpt [phone]  Self-monitoring  Self care activities  Distress tolerance skill  Validate/empathize  Rationally challenge thoughts or beliefs/cognitive restructuring  Facilitate problem solving  Comments  Patient agrees to have a Webex video session due to the pandemic. she is at home and I am in my home office.  Dx.: Adjustment Disorder  Goals: Patient reports she is seeking counseling to facilitate complicated family dynamics. She is also contending with anger and frustration associated with current family dynamics. Reports depressive symptoms associated with relationship satisfaction and seeking to determine ways to improve family/marital situation and reduce symptoms. Goal date is 12-24.  Acacia is no longer taking the Effexor and says she is fine off of it. She does not feel she needs any additional medication. He feels that her life "is what it is" and she just has to settle. She feels that if she were to leave, "he would make my life miserable". She does says there are more "pros to cons" in her life, but that Pilar Plate has some things "about me that he obviously doesn't like". She says he is judgmental about how she spends money and it bothers him. She feels that the real issue for Pilar Plate is control. She says that she has her own funds but it is very small compared to [place]. She says he is very generous with everyone except with her. She says that there was a big envelope that came to the house from the person who makes their wills. Pilar Plate took the envelope and she asked about it. He told her she needs to sign a  new will due to legal changes. She told him that she wants to read and review. We discussed the need to shift expectations of Pilar Plate and live a live with less conflict.              [name], PhD  Time: 9:40a-10:30a 50 minutes

## 2022-07-04 ENCOUNTER — Other Ambulatory Visit: Payer: Self-pay | Admitting: Physical Medicine and Rehabilitation

## 2022-07-04 DIAGNOSIS — E1122 Type 2 diabetes mellitus with diabetic chronic kidney disease: Secondary | ICD-10-CM | POA: Diagnosis not present

## 2022-07-04 DIAGNOSIS — M79672 Pain in left foot: Secondary | ICD-10-CM

## 2022-07-04 DIAGNOSIS — I129 Hypertensive chronic kidney disease with stage 1 through stage 4 chronic kidney disease, or unspecified chronic kidney disease: Secondary | ICD-10-CM | POA: Diagnosis not present

## 2022-07-04 DIAGNOSIS — M545 Low back pain, unspecified: Secondary | ICD-10-CM | POA: Diagnosis not present

## 2022-07-04 DIAGNOSIS — I151 Hypertension secondary to other renal disorders: Secondary | ICD-10-CM | POA: Diagnosis not present

## 2022-07-04 DIAGNOSIS — N1832 Chronic kidney disease, stage 3b: Secondary | ICD-10-CM | POA: Diagnosis not present

## 2022-07-04 DIAGNOSIS — D631 Anemia in chronic kidney disease: Secondary | ICD-10-CM | POA: Diagnosis not present

## 2022-07-10 DIAGNOSIS — Z5181 Encounter for therapeutic drug level monitoring: Secondary | ICD-10-CM | POA: Diagnosis not present

## 2022-07-10 DIAGNOSIS — E7800 Pure hypercholesterolemia, unspecified: Secondary | ICD-10-CM | POA: Diagnosis not present

## 2022-07-14 ENCOUNTER — Other Ambulatory Visit (HOSPITAL_COMMUNITY): Payer: Self-pay

## 2022-07-19 ENCOUNTER — Other Ambulatory Visit (HOSPITAL_COMMUNITY): Payer: Self-pay

## 2022-07-26 ENCOUNTER — Other Ambulatory Visit (HOSPITAL_COMMUNITY): Payer: Self-pay

## 2022-07-27 ENCOUNTER — Other Ambulatory Visit (HOSPITAL_COMMUNITY): Payer: Self-pay

## 2022-07-27 ENCOUNTER — Other Ambulatory Visit: Payer: Self-pay

## 2022-07-27 DIAGNOSIS — M810 Age-related osteoporosis without current pathological fracture: Secondary | ICD-10-CM | POA: Diagnosis not present

## 2022-07-27 MED ORDER — MOUNJARO 10 MG/0.5ML ~~LOC~~ SOAJ
10.0000 mg | SUBCUTANEOUS | 2 refills | Status: DC
  Filled 2022-07-27 (×2): qty 2, 28d supply, fill #0
  Filled 2022-08-24: qty 2, 28d supply, fill #1
  Filled 2022-09-12: qty 2, 28d supply, fill #2

## 2022-07-28 ENCOUNTER — Ambulatory Visit
Admission: RE | Admit: 2022-07-28 | Discharge: 2022-07-28 | Disposition: A | Discharge status: Home / Self Care | Payer: Medicare Other | Source: Ambulatory Visit | Attending: Physical Medicine and Rehabilitation | Admitting: Physical Medicine and Rehabilitation

## 2022-07-28 DIAGNOSIS — M19072 Primary osteoarthritis, left ankle and foot: Secondary | ICD-10-CM | POA: Diagnosis not present

## 2022-07-28 DIAGNOSIS — M722 Plantar fascial fibromatosis: Secondary | ICD-10-CM | POA: Diagnosis not present

## 2022-07-28 DIAGNOSIS — M65872 Other synovitis and tenosynovitis, left ankle and foot: Secondary | ICD-10-CM | POA: Diagnosis not present

## 2022-07-28 DIAGNOSIS — M79672 Pain in left foot: Secondary | ICD-10-CM

## 2022-08-11 DIAGNOSIS — M810 Age-related osteoporosis without current pathological fracture: Secondary | ICD-10-CM | POA: Diagnosis not present

## 2022-08-17 DIAGNOSIS — D649 Anemia, unspecified: Secondary | ICD-10-CM | POA: Diagnosis not present

## 2022-08-21 ENCOUNTER — Encounter: Payer: Self-pay | Admitting: Internal Medicine

## 2022-08-21 ENCOUNTER — Ambulatory Visit (INDEPENDENT_AMBULATORY_CARE_PROVIDER_SITE_OTHER): Payer: Medicare Other | Admitting: Internal Medicine

## 2022-08-21 VITALS — BP 108/76 | HR 74 | Ht 63.0 in | Wt 135.0 lb

## 2022-08-21 DIAGNOSIS — K5909 Other constipation: Secondary | ICD-10-CM

## 2022-08-21 DIAGNOSIS — M545 Low back pain, unspecified: Secondary | ICD-10-CM | POA: Diagnosis not present

## 2022-08-21 DIAGNOSIS — Z9884 Bariatric surgery status: Secondary | ICD-10-CM | POA: Diagnosis not present

## 2022-08-21 DIAGNOSIS — K2100 Gastro-esophageal reflux disease with esophagitis, without bleeding: Secondary | ICD-10-CM | POA: Diagnosis not present

## 2022-08-21 NOTE — Patient Instructions (Signed)
_______________________________________________________  If your blood pressure at your visit was 140/90 or greater, please contact your primary care physician to follow up on this.  _______________________________________________________  If you are age 71 or older, your body mass index should be between 23-30. Your Body mass index is 23.91 kg/m. If this is out of the aforementioned range listed, please consider follow up with your Primary Care Provider.  If you are age 36 or younger, your body mass index should be between 19-25. Your Body mass index is 23.91 kg/m. If this is out of the aformentioned range listed, please consider follow up with your Primary Care Provider.   ________________________________________________________  The [facility] providers would like to encourage you to use [facility] to communicate with providers for non-urgent requests or questions.  Due to long hold times on the telephone, sending your provider a message by [facility] may be a faster and more efficient way to get a response.  Please allow 48 business hours for a response.  Please remember that this is for non-urgent requests.  _______________________________________________________  Bonita Quin have been given samples of Linzess .  Linzess works best when taken once a day every day, on an empty stomach, at least 30 minutes before your first meal of the day.  When Linzess is taken daily as directed:  *Constipation relief is typically felt in about a week *IBS-C patients may begin to experience relief from belly pain and overall abdominal symptoms (pain, discomfort, and bloating) in about 1 week,   with symptoms typically improving over 12 weeks.  Diarrhea may occur in the first 2 weeks -keep taking it.  The diarrhea should go away and you should start having normal, complete, full bowel movements. It may be helpful to start treatment when you can be near the comfort of your own bathroom, such as a weekend.

## 2022-08-21 NOTE — Progress Notes (Signed)
HISTORY OF PRESENT ILLNESS:  Gabrielle Lynch is a 72 y.o. female past today regarding chronic constipation.  Previous patient Dr. Sharrell Ku.  Multiple outside records reviewed.  Previous procedures as outlined below: COLONOSCOPIES 1.  2001.  Negative 2.  2008.  Negative 3.  2013.  Diverticulosis and hemorrhoids 4.  2019.  Negative for neoplasia  Upper endoscopy 1.  2011.  Evidence of prior lap band procedure 2.  2013.  Evidence of prior lap band procedure.  Grade B esophagitis  Patient reports lifelong problems with chronic constipation.  She has tried multiple agents.  Currently taking 4 Dulcolax at night and to collect suppository several times per week.  With progressive constipation, she does not get abdominal discomfort.  However, she is concerned about how hard or firm her bowel movements are, as well as difficulty with evacuation.  She is status post total abdominal hysterectomy, rectocele repair, and pubovaginal sling procedure.  Previous reports of trying MiraLAX resulted in diarrhea.  Seen with Linzess 145 mcg.  Amitiza ineffective.  She inquires about nonconventional therapies promoted over the Internet.  She continues to travel extensively.  Anticipating a several week trip to [place] in the near future.  She denies rectal bleeding.  She has been on [facility] for some months now.  Has resulted in weight loss.  She did not feel that this has exacerbated her constipation.  No family history of colon cancer.  She does inquire about proper timing of her next colonoscopy.  REVIEW OF SYSTEMS:  All non-GI ROS negative except unless otherwise stated in the HPI for sinus and allergy trouble, arthritis  Past Medical History:  Diagnosis Date   Abnormal Pap smear of cervix    Hx of cryotherapy to cervix in her 40s   Abnormal uterine bleeding    history of fibroids   Anemia    Chronic kidney disease    stage 2   Diabetes mellitus without complication (HCC)    type 2 diagnosed 3  years ago   DJD (degenerative joint disease)    Fibroid    Heart murmur    functional   History of hypertension NO MEDS SINCE LAP GASTRIC BAND 2010 --  WT. 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  STATES SHE CARRIES EPI PEN--BUT HAS NOT HAD ANY RECENT EPISODES.  STATES MANY OF THE MEDICATIONS SHE TAKES ARE TO HELP THIS PROBLEM - INCLUDING  Ketotifen-mast cell inhibitor-not approved in [place].    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)    Osteopenia    Other fracture of sacrum, initial encounter for closed fracture (HCC) 06/2017   PMR (polymyalgia rheumatica) (HCC)    PONV (postoperative nausea and vomiting)    Spondylosis     Past Surgical History:  Procedure Laterality Date   ABDOMINAL HYSTERECTOMY  02/20/2011   Procedure: HYSTERECTOMY ABDOMINAL;  Surgeon: Trellis Paganini, MD;  Location: [facility];  Service: Gynecology;  Laterality: N/A;   BREAST BIOPSY Right 05/02/2013   [place]   CATARACT EXTRACTION, BILATERAL Bilateral 10/2019   CERVICAL BIOPSY  W/ LOOP ELECTRODE EXCISION  1992   CERVIX LESION DESTRUCTION     CESAREAN SECTION  1984, 1987   X2  CHOLECYSTECTOMY     COLONOSCOPY     DILATION AND CURETTAGE OF UTERUS     heavy menses   HYSTEROSCOPY  06/1995   HYSTEROSCOPIC MYOMECTOMY   HYSTEROSCOPY  06/1998   D&C, HYSTEROSCOPY   KNEE SURGERY     X2 right knee   LAPAROSCOPIC GASTRIC BANDING  08-03-2008   [facility]   LESION REMOVAL N/A 09/02/2012   Procedure: EXCISION VAGINAL LESION;  Surgeon: Ok Edwards, MD;  Location: [facility];  Service: Gynecology;  Laterality: N/A;  cpt 57100  one hour   PUBOVAGINAL SLING  02/20/2011   Procedure: Leonides Grills;  Surgeon: Kathi Ludwig, MD;  Location: [facility];  Service: Urology;  Laterality: N/A;   RECTOCELE REPAIR N/A 12/19/2016   Procedure: POSTERIOR REPAIR (RECTOCELE);  Surgeon: Patton Salles, MD;  Location: [facility];  Service: Gynecology;  Laterality: N/A;  1.25 hours   SALPINGOOPHORECTOMY  02/20/2011   Procedure: SALPINGO OOPHERECTOMY;  Surgeon: Trellis Paganini, MD;  Location: [facility];  Service: Gynecology;  Laterality: Bilateral;   UPPER GASTROINTESTINAL ENDOSCOPY  06/2011   VENTRAL HERNIA REPAIR  05/24/2011   Procedure: LAPAROSCOPIC VENTRAL HERNIA;  Surgeon: Valarie Merino, MD;  Location: [facility];  Service: General;  Laterality: N/A;   WOUND EXPLORATION Left 02/22/2018   Procedure: EXPLORATION OF MASS  BEHIND LEFT EAR;  Surgeon: Luretha Murphy, MD;  Location: [facility];  Service: General;  Laterality: Left;    Social History Gabrielle Lynch  reports that she has never smoked. She has never used smokeless tobacco. She reports that she does not drink alcohol and does not use drugs.  family history includes Breast cancer in her mother; Cancer in her father; Hypertension in her mother; Thyroid disease in her maternal grandmother and mother.  Allergies  Allergen Reactions   Lisinopril Other (See Comments)   Demerol Nausea Only       PHYSICAL EXAMINATION: Vital signs: Ht  (1.6 m)   Wt 135 lb (61.2 kg)   LMP  (LMP Unknown)   BMI 23.91 kg/m   Constitutional: generally well-appearing, no acute distress Psychiatric: alert and oriented x3, cooperative Eyes: extraocular movements intact, anicteric, conjunctiva pink Mouth: oral pharynx moist, no lesions Neck: supple no lymphadenopathy Cardiovascular: heart regular rate and rhythm, no murmur Lungs: clear to auscultation bilaterally Abdomen: soft, nontender, nondistended, no obvious ascites, no peritoneal signs, normal bowel sounds, no organomegaly Rectal: Omitted Extremities: no clubbing, cyanosis, or lower extremity edema bilaterally Skin: no lesions  on visible extremities Neuro: No focal deficits.  Cranial nerves intact  ASSESSMENT:  1.  Chronic constipation 2.  History of rectocele, total abdominal hysterectomy, and pubovaginal sling procedure 3.  Multiple prior colonoscopies (2001, 2008, 2013, 2019) without neoplasia 4.  Status post lap band procedure 5.  GERD 6.  Prior upper endoscopies as noted   PLAN:  1.  We discussed multiple strategies to deal with her constipation.  She will continue to prolapse therapies as this works. 2.  Recommended reinstituting low-dose MiraLAX 3.  Provided samples of Linzess 72 mcg.  Can prescribe.  Medication risk reviewed.  We will see if this lower dose is more effective in terms of bowel movements without significant diarrhea as a side effect.  She tried once daily.  Cannot try every other day or every third day or even as a rescue agent if needed. 4.  Reflux precautions 5.  Recall colonoscopy for 2029. 6.  Interval GI  follow-up as needed Total time of 45 minutes was spent preparing to see the patient, reviewing a myriad of outside records, obtaining comprehensive history, performing medically appropriate physical exam, counseling and educating the patient regarding the above listed issues, ordering medication, and documenting clinical information in the health record

## 2022-08-24 ENCOUNTER — Other Ambulatory Visit (HOSPITAL_COMMUNITY): Payer: Self-pay

## 2022-08-28 ENCOUNTER — Ambulatory Visit: Payer: Medicare Other | Admitting: Psychology

## 2022-08-28 NOTE — Progress Notes (Incomplete)
                               Progress Note Date 08/28/2022  Diagnosis 34.89 Symptoms Anxiety/depression Medication Status Effexor 75mg   Safety none  If Suicidal or Homicidal State Action Taken: unspecified  Current Risk: low Medications none Objectives unspecified Client Response full compliance  Service Location Location, [address] [place], [place] [address]  Service Code cpt [phone]  Self-monitoring  Self care activities  Distress tolerance skill  Validate/empathize  Rationally challenge thoughts or beliefs/cognitive restructuring  Facilitate problem solving  Comments  Patient agrees to have a Webex video session due to the pandemic. she is at home and I am in my home office.  Dx.: Adjustment Disorder  Goals: Patient reports she is seeking counseling to facilitate complicated family dynamics. She is also contending with anger and frustration associated with current family dynamics. Reports depressive symptoms associated with relationship satisfaction and seeking to determine ways to improve family/marital situation and reduce symptoms. Goal date is 12-24.  Bettye Boeck, PhD  Time: 9:40a-10:30a 50 minutes

## 2022-08-29 DIAGNOSIS — Z23 Encounter for immunization: Secondary | ICD-10-CM | POA: Diagnosis not present

## 2022-08-30 DIAGNOSIS — E7800 Pure hypercholesterolemia, unspecified: Secondary | ICD-10-CM | POA: Diagnosis not present

## 2022-08-30 DIAGNOSIS — Z5181 Encounter for therapeutic drug level monitoring: Secondary | ICD-10-CM | POA: Diagnosis not present

## 2022-08-31 DIAGNOSIS — G729 Myopathy, unspecified: Secondary | ICD-10-CM | POA: Diagnosis not present

## 2022-08-31 DIAGNOSIS — E1129 Type 2 diabetes mellitus with other diabetic kidney complication: Secondary | ICD-10-CM | POA: Diagnosis not present

## 2022-08-31 DIAGNOSIS — I129 Hypertensive chronic kidney disease with stage 1 through stage 4 chronic kidney disease, or unspecified chronic kidney disease: Secondary | ICD-10-CM | POA: Diagnosis not present

## 2022-08-31 DIAGNOSIS — K5901 Slow transit constipation: Secondary | ICD-10-CM | POA: Diagnosis not present

## 2022-08-31 DIAGNOSIS — E039 Hypothyroidism, unspecified: Secondary | ICD-10-CM | POA: Diagnosis not present

## 2022-08-31 DIAGNOSIS — E785 Hyperlipidemia, unspecified: Secondary | ICD-10-CM | POA: Diagnosis not present

## 2022-08-31 DIAGNOSIS — F330 Major depressive disorder, recurrent, mild: Secondary | ICD-10-CM | POA: Diagnosis not present

## 2022-08-31 DIAGNOSIS — N1832 Chronic kidney disease, stage 3b: Secondary | ICD-10-CM | POA: Diagnosis not present

## 2022-08-31 DIAGNOSIS — I712 Thoracic aortic aneurysm, without rupture, unspecified: Secondary | ICD-10-CM | POA: Diagnosis not present

## 2022-08-31 DIAGNOSIS — D47Z9 Other specified neoplasms of uncertain behavior of lymphoid, hematopoietic and related tissue: Secondary | ICD-10-CM | POA: Diagnosis not present

## 2022-08-31 DIAGNOSIS — D8940 Mast cell activation, unspecified: Secondary | ICD-10-CM | POA: Diagnosis not present

## 2022-08-31 DIAGNOSIS — Z9884 Bariatric surgery status: Secondary | ICD-10-CM | POA: Diagnosis not present

## 2022-09-12 ENCOUNTER — Other Ambulatory Visit (HOSPITAL_COMMUNITY): Payer: Self-pay

## 2022-09-12 ENCOUNTER — Other Ambulatory Visit: Payer: Self-pay

## 2022-09-12 MED ORDER — MOUNJARO 12.5 MG/0.5ML ~~LOC~~ SOAJ
12.5000 mg | SUBCUTANEOUS | 5 refills | Status: DC
  Filled 2022-09-12: qty 2, 28d supply, fill #0
  Filled 2022-10-23: qty 2, 28d supply, fill #1

## 2022-10-23 ENCOUNTER — Other Ambulatory Visit (HOSPITAL_COMMUNITY): Payer: Self-pay

## 2022-11-06 ENCOUNTER — Other Ambulatory Visit (HOSPITAL_COMMUNITY): Payer: Self-pay

## 2022-11-06 MED ORDER — MOUNJARO 7.5 MG/0.5ML ~~LOC~~ SOAJ
7.5000 mg | SUBCUTANEOUS | 11 refills | Status: DC
  Filled 2022-11-06: qty 2, 28d supply, fill #0
  Filled 2022-12-14: qty 2, 28d supply, fill #1
  Filled 2023-01-08: qty 2, 28d supply, fill #2
  Filled 2023-02-16: qty 2, 28d supply, fill #3
  Filled 2023-03-17: qty 2, 28d supply, fill #4
  Filled 2023-04-12: qty 2, 28d supply, fill #5
  Filled 2023-05-12: qty 2, 28d supply, fill #6
  Filled 2023-06-09: qty 2, 28d supply, fill #7
  Filled 2023-07-05: qty 2, 28d supply, fill #8
  Filled 2023-07-30: qty 2, 28d supply, fill #9
  Filled 2023-08-28: qty 2, 28d supply, fill #10
  Filled 2023-09-23: qty 2, 28d supply, fill #11

## 2022-11-14 ENCOUNTER — Ambulatory Visit: Payer: Medicare Other | Admitting: Obstetrics and Gynecology

## 2022-12-04 DIAGNOSIS — Z0000 Encounter for general adult medical examination without abnormal findings: Secondary | ICD-10-CM | POA: Diagnosis not present

## 2022-12-04 DIAGNOSIS — E039 Hypothyroidism, unspecified: Secondary | ICD-10-CM | POA: Diagnosis not present

## 2022-12-04 DIAGNOSIS — G43009 Migraine without aura, not intractable, without status migrainosus: Secondary | ICD-10-CM | POA: Diagnosis not present

## 2022-12-04 DIAGNOSIS — K581 Irritable bowel syndrome with constipation: Secondary | ICD-10-CM | POA: Diagnosis not present

## 2022-12-04 DIAGNOSIS — Q822 Mastocytosis: Secondary | ICD-10-CM | POA: Diagnosis not present

## 2022-12-04 DIAGNOSIS — E7800 Pure hypercholesterolemia, unspecified: Secondary | ICD-10-CM | POA: Diagnosis not present

## 2022-12-04 DIAGNOSIS — I10 Essential (primary) hypertension: Secondary | ICD-10-CM | POA: Diagnosis not present

## 2022-12-04 DIAGNOSIS — D649 Anemia, unspecified: Secondary | ICD-10-CM | POA: Diagnosis not present

## 2022-12-04 DIAGNOSIS — I7121 Aneurysm of the ascending aorta, without rupture: Secondary | ICD-10-CM | POA: Diagnosis not present

## 2022-12-04 DIAGNOSIS — E119 Type 2 diabetes mellitus without complications: Secondary | ICD-10-CM | POA: Diagnosis not present

## 2022-12-04 DIAGNOSIS — N183 Chronic kidney disease, stage 3 unspecified: Secondary | ICD-10-CM | POA: Diagnosis not present

## 2022-12-04 DIAGNOSIS — M8580 Other specified disorders of bone density and structure, unspecified site: Secondary | ICD-10-CM | POA: Diagnosis not present

## 2022-12-04 DIAGNOSIS — Z1331 Encounter for screening for depression: Secondary | ICD-10-CM | POA: Diagnosis not present

## 2022-12-04 DIAGNOSIS — E538 Deficiency of other specified B group vitamins: Secondary | ICD-10-CM | POA: Diagnosis not present

## 2022-12-05 ENCOUNTER — Other Ambulatory Visit: Payer: Self-pay | Admitting: Internal Medicine

## 2022-12-05 DIAGNOSIS — I7121 Aneurysm of the ascending aorta, without rupture: Secondary | ICD-10-CM

## 2022-12-06 DIAGNOSIS — N1832 Chronic kidney disease, stage 3b: Secondary | ICD-10-CM | POA: Diagnosis not present

## 2022-12-06 DIAGNOSIS — E039 Hypothyroidism, unspecified: Secondary | ICD-10-CM | POA: Diagnosis not present

## 2022-12-06 DIAGNOSIS — E785 Hyperlipidemia, unspecified: Secondary | ICD-10-CM | POA: Diagnosis not present

## 2022-12-06 DIAGNOSIS — E1129 Type 2 diabetes mellitus with other diabetic kidney complication: Secondary | ICD-10-CM | POA: Diagnosis not present

## 2022-12-06 DIAGNOSIS — I129 Hypertensive chronic kidney disease with stage 1 through stage 4 chronic kidney disease, or unspecified chronic kidney disease: Secondary | ICD-10-CM | POA: Diagnosis not present

## 2022-12-15 ENCOUNTER — Other Ambulatory Visit (HOSPITAL_COMMUNITY): Payer: Self-pay

## 2022-12-21 ENCOUNTER — Ambulatory Visit
Admission: RE | Admit: 2022-12-21 | Discharge: 2022-12-21 | Disposition: A | Discharge status: Home / Self Care | Payer: Medicare Other | Source: Ambulatory Visit | Attending: Internal Medicine | Admitting: Internal Medicine

## 2022-12-21 DIAGNOSIS — I700 Atherosclerosis of aorta: Secondary | ICD-10-CM | POA: Diagnosis not present

## 2022-12-21 DIAGNOSIS — I7121 Aneurysm of the ascending aorta, without rupture: Secondary | ICD-10-CM | POA: Diagnosis not present

## 2022-12-21 MED ORDER — IOPAMIDOL (ISOVUE-370) INJECTION 76%
200.0000 mL | Freq: Once | INTRAVENOUS | Status: AC | PRN
  Administered 2022-12-21: 60 mL via INTRAVENOUS

## 2022-12-26 ENCOUNTER — Other Ambulatory Visit: Payer: Self-pay | Admitting: Internal Medicine

## 2022-12-26 DIAGNOSIS — H43813 Vitreous degeneration, bilateral: Secondary | ICD-10-CM | POA: Diagnosis not present

## 2022-12-26 DIAGNOSIS — H5201 Hypermetropia, right eye: Secondary | ICD-10-CM | POA: Diagnosis not present

## 2022-12-26 DIAGNOSIS — H5212 Myopia, left eye: Secondary | ICD-10-CM | POA: Diagnosis not present

## 2022-12-26 DIAGNOSIS — I773 Arterial fibromuscular dysplasia: Secondary | ICD-10-CM

## 2022-12-26 DIAGNOSIS — Z961 Presence of intraocular lens: Secondary | ICD-10-CM | POA: Diagnosis not present

## 2022-12-27 DIAGNOSIS — Z23 Encounter for immunization: Secondary | ICD-10-CM | POA: Diagnosis not present

## 2022-12-28 ENCOUNTER — Ambulatory Visit
Admission: RE | Admit: 2022-12-28 | Discharge: 2022-12-28 | Disposition: A | Discharge status: Home / Self Care | Payer: Medicare Other | Source: Ambulatory Visit | Attending: Internal Medicine | Admitting: Internal Medicine

## 2022-12-28 DIAGNOSIS — I773 Arterial fibromuscular dysplasia: Secondary | ICD-10-CM

## 2022-12-28 DIAGNOSIS — I700 Atherosclerosis of aorta: Secondary | ICD-10-CM | POA: Diagnosis not present

## 2022-12-28 MED ORDER — IOPAMIDOL (ISOVUE-370) INJECTION 76%
200.0000 mL | Freq: Once | INTRAVENOUS | Status: AC | PRN
  Administered 2022-12-28: 60 mL via INTRAVENOUS

## 2023-01-09 ENCOUNTER — Other Ambulatory Visit (HOSPITAL_COMMUNITY): Payer: Self-pay

## 2023-01-24 DIAGNOSIS — R278 Other lack of coordination: Secondary | ICD-10-CM | POA: Diagnosis not present

## 2023-01-24 DIAGNOSIS — K5900 Constipation, unspecified: Secondary | ICD-10-CM | POA: Diagnosis not present

## 2023-01-24 DIAGNOSIS — M6281 Muscle weakness (generalized): Secondary | ICD-10-CM | POA: Diagnosis not present

## 2023-01-29 ENCOUNTER — Ambulatory Visit (INDEPENDENT_AMBULATORY_CARE_PROVIDER_SITE_OTHER): Payer: Medicare Other | Admitting: Surgery

## 2023-01-29 ENCOUNTER — Encounter: Payer: Self-pay | Admitting: Surgery

## 2023-01-29 VITALS — BP 95/67 | HR 83 | Temp 97.7°F | Resp 20 | Ht 63.0 in | Wt 123.5 lb

## 2023-01-29 DIAGNOSIS — I7121 Aneurysm of the ascending aorta, without rupture: Secondary | ICD-10-CM

## 2023-01-29 NOTE — Progress Notes (Signed)
[facility] [place]  Patient name: Gabrielle Lynch MRN: [id number] DOB: 24-Apr-1951 Sex: female   REQUESTING PROVIDER:    Dr. Margaretann Loveless   REASON FOR CONSULT:    FMD, aortic aneurysm  HISTORY OF PRESENT ILLNESS:   Gabrielle Lynch is a 72 y.o. female, who is referred for evaluation of fibromuscular disease found in her right kidney on a CT scan.  She went for her CT scan to follow-up a ascending aortic aneurysm and because went down into the abdomen which detected her FMD.  She does have mild stage II renal disease which is being managed at [facility].  The most recent creatinine was 1.4.  Her blood pressure is controlled on an ARB.Marland Kitchen  She is medically managed for diabetes.  She is a non-smoker  PAST MEDICAL HISTORY    Past Medical History:  Diagnosis Date   Abnormal Pap smear of cervix    Hx of cryotherapy to cervix in her 54s   Abnormal uterine bleeding    history of fibroids   Anemia    Chronic kidney disease    stage 2   Diabetes mellitus without complication (HCC)    type 2 diagnosed 3 years ago   DJD (degenerative joint disease)    Fibroid    Heart murmur    functional   History of hypertension NO MEDS SINCE LAP GASTRIC BAND 2010 --  WT. 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  STATES SHE CARRIES EPI PEN--BUT HAS NOT HAD ANY RECENT EPISODES.  STATES MANY OF THE MEDICATIONS SHE TAKES ARE TO HELP THIS PROBLEM - INCLUDING  Ketotifen-mast cell inhibitor-not approved in [place].    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)    Osteopenia    Other fracture of sacrum, initial encounter for closed fracture (HCC) 06/2017   PMR (polymyalgia rheumatica) (HCC)    PONV (postoperative nausea and  vomiting)    Spondylosis      FAMILY HISTORY   Family History  Problem Relation Age of Onset   Breast cancer Mother        53's   Hypertension Mother    Thyroid disease Mother        hypothyroid   Cancer Father        LUNG   Thyroid disease Maternal Grandmother        hypothyroid    SOCIAL HISTORY:   Social History   Socioeconomic History   Marital status: Married    Spouse name: Not on file   Number of children: 2   Years of education: Not on file   Highest education level: Not on file  Occupational History   Occupation: retired  Tobacco Use   Smoking status: Never   Smokeless tobacco: Never  Vaping Use   Vaping status: Never Used  Substance and Sexual Activity   Alcohol use: No    Alcohol/week: 0.0 standard drinks of alcohol    Comment: social   Drug use: No   Sexual activity: Not Currently    Partners: Male    Birth control/protection: Surgical    Comment: Less than 5 sexual partners, >16 first tme of intercourse  Other Topics Concern   Not  on file  Social History Narrative   Lives with husband   Social Determinants of Health   Financial Resource Strain: Not on file  Food Insecurity: Not on file  Transportation Needs: Not on file  Physical Activity: Not on file  Stress: Not on file  Social Connections: Not on file  Intimate Partner Violence: Not on file    ALLERGIES:    Allergies  Allergen Reactions   Lisinopril Other (See Comments)   Demerol Nausea Only    CURRENT MEDICATIONS:    Current Outpatient Medications  Medication Sig Dispense Refill   cetirizine (ZYRTEC) 10 MG tablet Take 20 mg by mouth daily.     clonazePAM (KLONOPIN) 1 MG tablet 2 tabs     denosumab (PROLIA) 60 MG/ML SOSY injection Inject 60 mg into the skin every 6 (six) months.     EPINEPHrine (EPI-PEN) 0.3 mg/0.3 mL DEVI Inject into the muscle as needed.     famotidine (PEPCID) 20 MG tablet 1 tablet at bedtime as needed     finasteride (PROSCAR) 5 MG tablet 1/2 tablet      hydrOXYzine (ATARAX/VISTARIL) 50 MG tablet Take 1 tablet by mouth 2 (two) times daily.     hyoscyamine (LEVSIN SL) 0.125 MG SL tablet Place 1 tablet under the tongue as needed.     losartan (COZAAR) 50 MG tablet      montelukast (SINGULAIR) 10 MG tablet Take 10 mg by mouth at bedtime.     ondansetron (ZOFRAN-ODT) 4 MG disintegrating tablet Take 1 tablet by mouth as needed.     PRESCRIPTION MEDICATION Take 1 tablet by mouth 2 (two) times daily. Ketotifen 5mg  table[place] For mast cell problem     rizatriptan (MAXALT) 10 MG tablet      SYNTHROID 112 MCG tablet Take 112 mcg by mouth daily before breakfast.     tirzepatide (MOUNJARO) 7.5 MG/0.5ML Pen Inject 7.5 mg into the skin once a week. 2 mL 11   tirzepatide (MOUNJARO) 7.5 MG/0.5ML Pen Inject 7.5 mg into the skin once a week. 2 mL 11   topiramate (TOPAMAX) 50 MG tablet Take 50 mg by mouth daily.     UNABLE TO FIND 2 capsules 2 (two) times daily. Med Name: AlgaeCal Plus     VASCEPA 1 g CAPS Take 1 capsule by mouth 2 (two) times daily.     LINZESS 145 MCG CAPS capsule Take 145 mcg by mouth every morning.     No current facility-administered medications for this visit.    REVIEW OF SYSTEMS:   [X]  denotes positive finding, [ ]  denotes negative finding Cardiac  Comments:  Chest pain or chest pressure:    Shortness of breath upon exertion:    Short of breath when lying flat:    Irregular heart rhythm:        Vascular    Pain in calf, thigh, or hip brought on by ambulation:    Pain in feet at night that wakes you up from your sleep:     Blood clot in your veins:    Leg swelling:         Pulmonary    Oxygen at home:    Productive cough:     Wheezing:         Neurologic    Sudden weakness in arms or legs:     Sudden numbness in arms or legs:     Sudden onset of difficulty speaking or slurred speech:  Temporary loss of vision in one eye:     Problems with dizziness:         Gastrointestinal    Blood  in stool:      Vomited blood:         Genitourinary    Burning when urinating:     Blood in urine:        Psychiatric    Major depression:         Hematologic    Bleeding problems:    Problems with blood clotting too easily:        Skin    Rashes or ulcers:        Constitutional    Fever or chills:     PHYSICAL EXAM:   Vitals:   01/29/23 1508  BP: 95/67  Pulse: 83  Resp: 20  Temp: 97.7 F (36.5 C)  SpO2: 96%  Weight: 123 lb 8 oz (56 kg)  Height: 5\' 3"  (1.6 m)    GENERAL: The patient is a well-nourished female, in no acute distress. The vital signs are documented above. CARDIAC: There is a regular rate and rhythm.  VASCULAR: No carotid bruits.  Brisk posterior tibial Doppler signals bilaterally PULMONARY: Nonlabored respirations MUSCULOSKELETAL: There are no major deformities or cyanosis. NEUROLOGIC: No focal weakness or paresthesias are detected. SKIN: There are no ulcers or rashes noted. PSYCHIATRIC: The patient has a normal affect.  STUDIES:   I have reviewed the following CT scan: VASCULAR   1. Positive for fibromuscular dysplasia of the mid aspect of the right renal artery. No evidence of arterial dissection, aneurysm or other complicating feature. 2. No visible evidence of fibromuscular dysplasia in the left renal artery. 3. Mild atherosclerotic plaque along the abdominal aorta. Aortic Atherosclerosis (ICD10-I70.0). 4. Mild anatomic variation. The common hepatic artery is replaced to the superior mesenteric artery.   NON-VASCULAR   1. No acute abnormality in the abdomen. 2. Surgical changes of prior lap band procedure without visible complication. 3. Grade 1 anterolisthesis of L4 on L5.   Chest CTA: 1. Stable fusiform dilatation of the ascending thoracic aorta, 4.1 cm. Recommend annual imaging followup by CTA or MRA. This recommendation follows 2010 ACCF/AHA/AATS/ACR/ASA/SCA/SCAI/SIR/STS/SVM Guidelines for the Diagnosis and Management of  Patients with Thoracic Aortic Disease. Circulation. 2010; 121: V564-P329. Aortic aneurysm NOS (ICD10-I71.9) 2. Beaded appearance of the right renal artery, suspicious for fibromuscular dysplasia. Recommend abdominal CTA for further assessment, this is only partially included in the field of view. 3. No acute findings in the chest. ASSESSMENT and PLAN   FMD of the right renal artery: She does have mild chronic renal insufficiency with a creatinine of 1.4.  I do not see any significant stenosis on the FMD pattern on her CT scan.  I am going to get a ultrasound to get hemodynamic information about her FMD.  In addition, I will get a carotid duplex to see if she has any disease in the carotid arteries as this is the second most common location of FMD.  Ascending aortic aneurysm: The patient does have a family history of coronary artery disease, with her grandfather.  Therefore I am going to refer her to cardiology for cardiac workup.  I will pass on following the ascending aortic aneurysm to them.  She will ultimately need to see cardiac surgery, as I do not fix this type of aneurysm   Durene Cal, IV, MD, FACS [facility] [phone] Pager [phone]

## 2023-01-30 DIAGNOSIS — R202 Paresthesia of skin: Secondary | ICD-10-CM | POA: Diagnosis not present

## 2023-01-31 DIAGNOSIS — K5900 Constipation, unspecified: Secondary | ICD-10-CM | POA: Diagnosis not present

## 2023-01-31 DIAGNOSIS — R278 Other lack of coordination: Secondary | ICD-10-CM | POA: Diagnosis not present

## 2023-01-31 DIAGNOSIS — M6281 Muscle weakness (generalized): Secondary | ICD-10-CM | POA: Diagnosis not present

## 2023-02-01 DIAGNOSIS — M316 Other giant cell arteritis: Secondary | ICD-10-CM | POA: Diagnosis not present

## 2023-02-01 DIAGNOSIS — R202 Paresthesia of skin: Secondary | ICD-10-CM | POA: Diagnosis not present

## 2023-02-01 DIAGNOSIS — M79641 Pain in right hand: Secondary | ICD-10-CM | POA: Diagnosis not present

## 2023-02-01 DIAGNOSIS — M1991 Primary osteoarthritis, unspecified site: Secondary | ICD-10-CM | POA: Diagnosis not present

## 2023-02-01 DIAGNOSIS — M353 Polymyalgia rheumatica: Secondary | ICD-10-CM | POA: Diagnosis not present

## 2023-02-01 DIAGNOSIS — Z6821 Body mass index (BMI) 21.0-21.9, adult: Secondary | ICD-10-CM | POA: Diagnosis not present

## 2023-02-01 DIAGNOSIS — R5383 Other fatigue: Secondary | ICD-10-CM | POA: Diagnosis not present

## 2023-02-01 DIAGNOSIS — M79642 Pain in left hand: Secondary | ICD-10-CM | POA: Diagnosis not present

## 2023-02-05 ENCOUNTER — Other Ambulatory Visit: Payer: Self-pay | Admitting: *Deleted

## 2023-02-05 DIAGNOSIS — I773 Arterial fibromuscular dysplasia: Secondary | ICD-10-CM

## 2023-02-06 DIAGNOSIS — R202 Paresthesia of skin: Secondary | ICD-10-CM | POA: Diagnosis not present

## 2023-02-06 DIAGNOSIS — M5030 Other cervical disc degeneration, unspecified cervical region: Secondary | ICD-10-CM | POA: Diagnosis not present

## 2023-02-06 NOTE — Progress Notes (Signed)
72 y.o. G2P2 Married Caucasian female here for breast and pelvic exam.   Has not been using Vagifem recently.  She is taking Prolia through her PCP. Labs done 02/15/23: Her EGFR is 46.  Cr 1.26. Calcium 8.9.  Her half sister developed breast cancer and did double mastectomy, and had negative BRCA testing.  Her mother also had breast cancer. Patient is concerned.   Her weight is stable.   No bladder or bowel control concerns.   Traveling to [place] regularly to see her son.  PCP: Thana Ates, MD  Endocrinology:  Dr. Evlyn Kanner  No LMP recorded (lmp unknown). Patient has had a hysterectomy.           Sexually active: No.  The current method of family planning is status post hysterectomy.    Exercising: No.   Smoker:  no  OB History  Gravida Para Term Preterm AB Living  2 2       2   SAB IAB Ectopic Multiple Live Births               # Outcome Date GA Lbr Len/2nd Weight Sex Type Anes PTL Lv  2 Para     F CS-LTranv     1 Para     M CS-LTranv        Health Maintenance: Pap:  08/30/21 neg: HR HPV neg.  Cervix benign with hysterectomy pathology specimen in 2012. History of abnormal Pap:  yes MMG: 05/24/22 Breast Density Cat B, BI-RADS CAT 1 neg Colonoscopy:   [facility] Colonoscopy          Colonoscopy (Every 10 Years) Next due on 05/25/2027    05/24/2017  Outside Claim: [place] COLONOSCOPY W/BIOPSY SINGLE/MULTIPLE   Only the first 1 history entries have been loaded, but more history exists.           BMD:  06/04/20  Result  osteopenia  HIV: 2012 NR Hep C: 2012 neg  Immunization History  Administered Date(s) Administered   Influenza,inj,quad, With Preservative 04/23/2014   PFIZER(Purple Top)SARS-COV-2 Vaccination 05/18/2019, 06/05/2019        reports that she has never smoked. She has never used smokeless tobacco. She reports that she does not drink alcohol and does not use drugs.  Past Medical History:  Diagnosis Date   Abnormal Pap smear of cervix    Hx of  cryotherapy to cervix in her 19s   Abnormal uterine bleeding    history of fibroids   Anemia    Chronic kidney disease    stage 2   Diabetes mellitus without complication (HCC)    type 2 diagnosed 3 years ago   DJD (degenerative joint disease)    Fibroid    Heart murmur    functional   History of hypertension NO MEDS SINCE LAP GASTRIC BAND 2010 --  WT. 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  STATES SHE CARRIES EPI PEN--BUT HAS NOT HAD ANY RECENT EPISODES.  STATES MANY OF THE MEDICATIONS SHE TAKES ARE TO HELP THIS PROBLEM - INCLUDING  Ketotifen-mast cell inhibitor-not approved in [place].    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)    Osteopenia    Other fracture of sacrum,  initial encounter for closed fracture (HCC) 06/2017   PMR (polymyalgia rheumatica) (HCC)    PONV (postoperative nausea and vomiting)    Spondylosis     Past Surgical History:  Procedure Laterality Date   ABDOMINAL HYSTERECTOMY  02/20/2011   Procedure: HYSTERECTOMY ABDOMINAL;  Surgeon: Trellis Paganini, MD;  Location: [facility];  Service: Gynecology;  Laterality: N/A;   BREAST BIOPSY Right 05/02/2013   [facility]   CATARACT EXTRACTION, BILATERAL Bilateral 10/2019   CERVICAL BIOPSY  W/ LOOP ELECTRODE EXCISION  1992   CERVIX LESION DESTRUCTION     CESAREAN SECTION  1984, 1987   X2   CHOLECYSTECTOMY     COLONOSCOPY     DILATION AND CURETTAGE OF UTERUS     heavy menses   HYSTEROSCOPY  06/1995   HYSTEROSCOPIC MYOMECTOMY   HYSTEROSCOPY  06/1998   D&C, HYSTEROSCOPY   KNEE SURGERY     X2 right knee   LAPAROSCOPIC GASTRIC BANDING  08-03-2008   [facility]   LESION REMOVAL N/A 09/02/2012   Procedure: EXCISION VAGINAL LESION;  Surgeon: Ok Edwards, MD;  Location:  [facility];  Service: Gynecology;  Laterality: N/A;  cpt 57100  one hour   PUBOVAGINAL SLING  02/20/2011   Procedure: Leonides Grills;  Surgeon: Kathi Ludwig, MD;  Location: [facility];  Service: Urology;  Laterality: N/A;   RECTOCELE REPAIR N/A 12/19/2016   Procedure: POSTERIOR REPAIR (RECTOCELE);  Surgeon: Patton Salles, MD;  Location: [facility];  Service: Gynecology;  Laterality: N/A;  1.25 hours   SALPINGOOPHORECTOMY  02/20/2011   Procedure: SALPINGO OOPHERECTOMY;  Surgeon: Trellis Paganini, MD;  Location: [facility];  Service: Gynecology;  Laterality: Bilateral;   UPPER GASTROINTESTINAL ENDOSCOPY  06/2011   VENTRAL HERNIA REPAIR  05/24/2011   Procedure: LAPAROSCOPIC VENTRAL HERNIA;  Surgeon: Valarie Merino, MD;  Location: [facility];  Service: General;  Laterality: N/A;   WOUND EXPLORATION Left 02/22/2018   Procedure: EXPLORATION OF MASS  BEHIND LEFT EAR;  Surgeon: Luretha Murphy, MD;  [facility];  Service: General;  Laterality: Left;    Current Outpatient Medications  Medication Sig Dispense Refill   cetirizine (ZYRTEC) 10 MG tablet Take 20 mg by mouth daily.     clonazePAM (KLONOPIN) 1 MG tablet 2 tabs     denosumab (PROLIA) 60 MG/ML SOSY injection Inject 60 mg into the skin every 6 (six) months.     EPINEPHrine (EPI-PEN) 0.3 mg/0.3 mL DEVI Inject into the muscle as needed.     famotidine (PEPCID) 20 MG tablet 1 tablet at bedtime as needed     finasteride (PROSCAR) 5 MG tablet 1/2 tablet     hydrOXYzine (ATARAX/VISTARIL) 50 MG tablet Take 1 tablet by mouth 2 (two) times daily.     hyoscyamine (LEVSIN SL) 0.125 MG SL tablet Place 1 tablet under the tongue as needed.     LINZESS 145 MCG CAPS capsule Take 145 mcg by mouth every morning.     losartan (COZAAR) 50 MG tablet      meloxicam (MOBIC) 15 MG tablet Take 15 mg by mouth daily.     montelukast (SINGULAIR) 10 MG tablet Take 10 mg by mouth at bedtime.     ondansetron (ZOFRAN-ODT)  4 MG disintegrating tablet Take 1 tablet by mouth as needed.     PRESCRIPTION MEDICATION Take 1 tablet by mouth 2 (two) times daily. Ketotifen 5mg  table[place] For mast cell problem  rizatriptan (MAXALT) 10 MG tablet      SYNTHROID 112 MCG tablet Take 112 mcg by mouth daily before breakfast.     tirzepatide (MOUNJARO) 7.5 MG/0.5ML Pen Inject 7.5 mg into the skin once a week. 2 mL 11   topiramate (TOPAMAX) 50 MG tablet Take 50 mg by mouth daily.     UNABLE TO FIND 2 capsules 2 (two) times daily. Med Name: AlgaeCal Plus     VASCEPA 1 g CAPS Take 1 capsule by mouth 2 (two) times daily.     No current facility-administered medications for this visit.    Family History  Problem Relation Age of Onset   Breast cancer Mother        15's   Hypertension Mother    Thyroid disease Mother        hypothyroid   Cancer Father        LUNG   Breast cancer Sister    Thyroid disease Maternal Grandmother        hypothyroid    Review of Systems  All other systems reviewed and are negative.   Exam:   BP 118/60 (BP Location: Left Arm, Patient Position: Sitting, Cuff Size: Normal)   Pulse 82   Ht 5' 4.5" (1.638 m)   Wt 125 lb (56.7 kg)   LMP  (LMP Unknown)   SpO2 99%   BMI 21.12 kg/m     General appearance: alert, cooperative and appears stated age Head: normocephalic, without obvious abnormality, atraumatic Neck: no adenopathy, supple, symmetrical, trachea midline and thyroid normal to inspection and palpation Lungs: clear to auscultation bilaterally Breasts: normal appearance, no masses or tenderness, No nipple retraction or dimpling, No nipple discharge or bleeding, No axillary adenopathy Heart: regular rate and rhythm Abdomen: port site noted in right abdominal wall, abdomen is soft, non-tender; no masses, no organomegaly Extremities: extremities normal, atraumatic, no cyanosis or edema Skin: skin color, texture, turgor normal. No rashes or lesions Lymph nodes:  cervical, supraclavicular, and axillary nodes normal. Neurologic: grossly normal  Pelvic: External genitalia:  no lesions              No abnormal inguinal nodes palpated.              Urethra:  normal appearing urethra with no masses, tenderness or lesions              Bartholins and Skenes: normal                 Vagina: normal appearing vagina with normal color and discharge, no lesions.  Mild atrophy symptoms noted.               Cervix:  absent              Pap taken: no Bimanual Exam:  Uterus:  absent               Adnexa: no mass, fullness, tenderness              Rectal exam: Yes.  .  Confirms.              Anus:  normal sphincter tone, no lesions  Chaperone was present for exam:  Warren Lacy, CMA   Assessment:  Encounter for breast and pelvic exam.  Status post total abdominal hysterectomy for fibroids in 2012.  Final cervical pathology benign.   Status post BSO.  Status post midurethral sling.  Status post rectocele repair. Vaginal atrophy.  Hx of abnormal paps  and cryo, LEEP, and conization of cervix.  Osteoporosis.  Treating with Prolia.  Bilateral sacral fracture 06/2017.  Anemia. Chronic kidney disease. Weight loss. Stable.   Plan: Mammogram screening discussed. Self breast awareness reviewed. We reviewed genetic counseling and testing, stratification of risk of breast cancer, breast MRI indications, and referral to high risk breast cancer clinic at the [facility].  Will start with referral to the genetics counselor and testing center.  Rx for Vagifem.  We discussed benefits and potential effect on breast cancer.  She may choose to use this once weekly. Pap and HR HPV not indicated.  Guidelines for Calcium, Vitamin D, regular exercise program including cardiovascular and weight bearing exercise. We discussed continuation of her Prolia.  Fu yearly and prn.  35 min  total time was spent for this patient encounter, including preparation, face-to-face  counseling with the patient, coordination of care, and documentation of the encounter in addition to doing the breast and pelvic exam.

## 2023-02-09 NOTE — Progress Notes (Deleted)
Cardiology Office Note:    Date:  02/09/2023   ID:  Gabrielle Lynch, DOB June 14, 1950, MRN [id number]  PCP:  Marden Noble, MD   [facility] Providers Cardiologist:  None { Click to update primary MD,subspecialty MD or APP then REFRESH:1}    Referring MD: Nada Libman, MD   No chief complaint on file. ***  History of Present Illness:    Gabrielle Lynch is a 72 y.o. female seen at the request of Dr Myra Gianotti for evaluation of fibromuscular dysplasia involving the right renal artery and thoracic aorta. She has a history of CKD stage 2, DM, HTN. Coronary calcium score in 12/21 was 0.   Past Medical History:  Diagnosis Date   Abnormal Pap smear of cervix    Hx of cryotherapy to cervix in her 24s   Abnormal uterine bleeding    history of fibroids   Anemia    Chronic kidney disease    stage 2   Diabetes mellitus without complication (HCC)    type 2 diagnosed 3 years ago   DJD (degenerative joint disease)    Fibroid    Heart murmur    functional   History of hypertension NO MEDS SINCE LAP GASTRIC BAND 2010 --  WT. 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  STATES SHE CARRIES EPI PEN--BUT HAS NOT HAD ANY RECENT EPISODES.  STATES MANY OF THE MEDICATIONS SHE TAKES ARE TO HELP THIS PROBLEM - INCLUDING  Ketotifen-mast cell inhibitor-not approved in [place].    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)    Osteopenia    Other fracture of sacrum, initial encounter for closed fracture (HCC) 06/2017   PMR (polymyalgia rheumatica) (HCC)    PONV (postoperative nausea and vomiting)    Spondylosis     Past Surgical History:  Procedure Laterality Date   ABDOMINAL HYSTERECTOMY  02/20/2011   Procedure: HYSTERECTOMY ABDOMINAL;  Surgeon:  Trellis Paganini, MD;  Location: [facility];  Service: Gynecology;  Laterality: N/A;   BREAST BIOPSY Right 05/02/2013   Pash   CATARACT EXTRACTION, BILATERAL Bilateral 10/2019   CERVICAL BIOPSY  W/ LOOP ELECTRODE EXCISION  1992   CERVIX LESION DESTRUCTION     CESAREAN SECTION  1984, 1987   X2   CHOLECYSTECTOMY     COLONOSCOPY     DILATION AND CURETTAGE OF UTERUS     heavy menses   HYSTEROSCOPY  06/1995   HYSTEROSCOPIC MYOMECTOMY   HYSTEROSCOPY  06/1998   D&C, HYSTEROSCOPY   KNEE SURGERY     X2 right knee   LAPAROSCOPIC GASTRIC BANDING  08-03-2008   [facility] APS SYSTEM   LESION REMOVAL N/A 09/02/2012   Procedure: EXCISION VAGINAL LESION;  Surgeon: Ok Edwards, MD;  Location: [facility];  Service: Gynecology;  Laterality: N/A;  cpt 57100  one hour   PUBOVAGINAL SLING  02/20/2011   Procedure: Leonides Grills;  Surgeon: Kathi Ludwig, MD;  Location: [facility];  Service: Urology;  Laterality: N/A;   RECTOCELE REPAIR N/A 12/19/2016   Procedure: POSTERIOR REPAIR (RECTOCELE);  Surgeon: Patton Salles, MD;  Location: [facility];  Service: Gynecology;  Laterality: N/A;  1.25 hours   SALPINGOOPHORECTOMY  02/20/2011   Procedure: SALPINGO OOPHERECTOMY;  Surgeon: Trellis Paganini, MD;  Location: [facility];  Service: Gynecology;  Laterality: Bilateral;   UPPER GASTROINTESTINAL ENDOSCOPY  06/2011   VENTRAL HERNIA REPAIR  05/24/2011   Procedure: LAPAROSCOPIC VENTRAL HERNIA;  Surgeon: Valarie Merino, MD;  Location: [facility];  Service: General;  Laterality: N/A;   WOUND EXPLORATION Left 02/22/2018   Procedure: EXPLORATION OF MASS  BEHIND LEFT EAR;  Surgeon: Luretha Murphy, MD;  Location: [facility];  Service: General;  Laterality: Left;    Current Medications: No outpatient medications have been marked as taking for the 02/14/23 encounter (Appointment) with [place], Viha Kriegel M, MD.     Allergies:   Lisinopril and Demerol   Social History    Socioeconomic History   Marital status: Married    Spouse name: Not on file   Number of children: 2   Years of education: Not on file   Highest education level: Not on file  Occupational History   Occupation: retired  Tobacco Use   Smoking status: Never   Smokeless tobacco: Never  Vaping Use   Vaping status: Never Used  Substance and Sexual Activity   Alcohol use: No    Alcohol/week: 0.0 standard drinks of alcohol    Comment: social   Drug use: No   Sexual activity: Not Currently    Partners: Male    Birth control/protection: Surgical    Comment: Less than 5 sexual partners, >16 first tme of intercourse  Other Topics Concern   Not on file  Social History Narrative   Lives with husband   Social Determinants of Health   Financial Resource Strain: Not on file  Food Insecurity: Not on file  Transportation Needs: Not on file  Physical Activity: Not on file  Stress: Not on file  Social Connections: Not on file     Family History: The patient's ***family history includes Breast cancer in her mother; Cancer in her father; Hypertension in her mother; Thyroid disease in her maternal grandmother and mother.  ROS:   Please see the history of present illness.    *** All other systems reviewed and are negative.  EKGs/Labs/Other Studies Reviewed:    The following studies were reviewed today: CT ANGIOGRAPHY CHEST WITH CONTRAST   TECHNIQUE: Multidetector CT imaging of the chest was performed using the standard protocol during bolus administration of intravenous contrast. Multiplanar CT image reconstructions and MIPs were obtained to evaluate the vascular anatomy.   RADIATION DOSE REDUCTION: This exam was performed according to the departmental dose-optimization program which includes automated exposure control, adjustment of the mA and/or kV according to patient size and/or use of iterative reconstruction technique.   CONTRAST:  60mL ISOVUE-370 IOPAMIDOL (ISOVUE-370)  INJECTION 76%   COMPARISON:  CT 09/01/2021   FINDINGS: Cardiovascular: Stable fusiform dilatation of the ascending aorta, 4.1 cm. The aorta at the sinuses of Valsalva measures 4.1 cm, that is an a tubular junction 3 cm. Normal caliber transverse and descending aorta. Minimal aortic atherosclerosis. Heart is normal in size. No pericardial effusion.   Mediastinum/Nodes: No mediastinal or hilar adenopathy. No thyroid nodule. Unremarkable esophagus.   Lungs/Pleura: No focal airspace disease. No pulmonary nodule. No pleural fluid. Trachea and central airways are clear.   Upper Abdomen: Lap band in place. The right renal artery has the beaded appearance, for example series 4, image 156.   Musculoskeletal: Remote healed left rib fracture. There are no acute or suspicious osseous abnormalities.   Review of the  MIP images confirms the above findings.   IMPRESSION: 1. Stable fusiform dilatation of the ascending thoracic aorta, 4.1 cm. Recommend annual imaging followup by CTA or MRA. This recommendation follows 2010 ACCF/AHA/AATS/ACR/ASA/SCA/SCAI/SIR/STS/SVM Guidelines for the Diagnosis and Management of Patients with Thoracic Aortic Disease. Circulation. 2010; 121: Q657-Q469. Aortic aneurysm NOS (ICD10-I71.9) 2. Beaded appearance of the right renal artery, suspicious for fibromuscular dysplasia. Recommend abdominal CTA for further assessment, this is only partially included in the field of view. 3. No acute findings in the chest.   Aortic Atherosclerosis (ICD10-I70.0).     Electronically Signed   By: Narda Rutherford M.D.   On: 12/23/2022 20:42        Recent Labs: No results found for requested labs within last 365 days.  Recent Lipid Panel No results found for: "CHOL", "TRIG", "HDL", "CHOLHDL", "VLDL", "LDLCALC", "LDLDIRECT"   Risk Assessment/Calculations:   {Does this patient have ATRIAL FIBRILLATION?:[phone]}  No BP recorded.  {Refresh Note OR Click here to enter BP   :1}***         Physical Exam:    VS:  LMP  (LMP Unknown)     Wt Readings from Last 3 Encounters:  01/29/23 123 lb 8 oz (56 kg)  08/21/22 135 lb (61.2 kg)  08/30/21 125 lb (56.7 kg)     GEN: *** Well nourished, well developed in no acute distress HEENT: Normal NECK: No JVD; No carotid bruits LYMPHATICS: No lymphadenopathy CARDIAC: ***RRR, no murmurs, rubs, gallops RESPIRATORY:  Clear to auscultation without rales, wheezing or rhonchi  ABDOMEN: Soft, non-tender, non-distended MUSCULOSKELETAL:  No edema; No deformity  SKIN: Warm and dry NEUROLOGIC:  Alert and oriented x 3 PSYCHIATRIC:  Normal affect   ASSESSMENT:    No diagnosis found. PLAN:    In order of problems listed above:  ***      {Are you ordering a CV Procedure (e.g. stress test, cath, DCCV, TEE, etc)?   Press F2        :[id number]}    Medication Adjustments/Labs and Tests Ordered: Current medicines are reviewed at length with the patient today.  Concerns regarding medicines are outlined above.  No orders of the defined types were placed in this encounter.  No orders of the defined types were placed in this encounter.   There are no Patient Instructions on file for this visit.   Signed, Zola Runion [place], MD  02/09/2023 12:56 PM    [facility]

## 2023-02-14 ENCOUNTER — Ambulatory Visit: Payer: Medicare Other | Admitting: Cardiology

## 2023-02-15 ENCOUNTER — Ambulatory Visit (HOSPITAL_COMMUNITY)
Admission: RE | Admit: 2023-02-15 | Discharge: 2023-02-15 | Disposition: A | Discharge status: Home / Self Care | Payer: Medicare Other | Source: Ambulatory Visit | Attending: Surgery | Admitting: Surgery

## 2023-02-15 ENCOUNTER — Ambulatory Visit (INDEPENDENT_AMBULATORY_CARE_PROVIDER_SITE_OTHER)
Admission: RE | Admit: 2023-02-15 | Discharge: 2023-02-15 | Disposition: A | Discharge status: Home / Self Care | Payer: Medicare Other | Source: Ambulatory Visit | Attending: Surgery | Admitting: Surgery

## 2023-02-15 DIAGNOSIS — G5603 Carpal tunnel syndrome, bilateral upper limbs: Secondary | ICD-10-CM | POA: Diagnosis not present

## 2023-02-15 DIAGNOSIS — I773 Arterial fibromuscular dysplasia: Secondary | ICD-10-CM

## 2023-02-15 DIAGNOSIS — D649 Anemia, unspecified: Secondary | ICD-10-CM | POA: Diagnosis not present

## 2023-02-20 ENCOUNTER — Ambulatory Visit (INDEPENDENT_AMBULATORY_CARE_PROVIDER_SITE_OTHER): Payer: Self-pay | Admitting: Surgery

## 2023-02-20 ENCOUNTER — Encounter: Payer: Self-pay | Admitting: Obstetrics and Gynecology

## 2023-02-20 ENCOUNTER — Ambulatory Visit (INDEPENDENT_AMBULATORY_CARE_PROVIDER_SITE_OTHER): Payer: Medicare Other | Admitting: Obstetrics and Gynecology

## 2023-02-20 VITALS — BP 118/60 | HR 82 | Ht 64.5 in | Wt 125.0 lb

## 2023-02-20 DIAGNOSIS — Z803 Family history of malignant neoplasm of breast: Secondary | ICD-10-CM

## 2023-02-20 DIAGNOSIS — I773 Arterial fibromuscular dysplasia: Secondary | ICD-10-CM

## 2023-02-20 DIAGNOSIS — Z5181 Encounter for therapeutic drug level monitoring: Secondary | ICD-10-CM

## 2023-02-20 DIAGNOSIS — Z01419 Encounter for gynecological examination (general) (routine) without abnormal findings: Secondary | ICD-10-CM

## 2023-02-20 DIAGNOSIS — N952 Postmenopausal atrophic vaginitis: Secondary | ICD-10-CM

## 2023-02-20 MED ORDER — ESTRADIOL 10 MCG VA TABS: 1.0000 | ORAL_TABLET | VAGINAL | 3 refills | Status: DC

## 2023-02-20 NOTE — Progress Notes (Signed)
Reviewed renal and carotid u/s findings today via phone.  No significant stenosis identified on the carotid or renal u/s. I will plan on repeating both studies in 2 years  Gabrielle Lynch

## 2023-02-20 NOTE — Patient Instructions (Signed)

## 2023-02-22 ENCOUNTER — Telehealth: Payer: Self-pay | Admitting: Genetic Counselor

## 2023-02-22 NOTE — Telephone Encounter (Signed)
Called and left VM for patient to call back to schedule an appointment.

## 2023-02-28 ENCOUNTER — Ambulatory Visit: Payer: Medicare Other | Admitting: Cardiology

## 2023-03-08 DIAGNOSIS — Z23 Encounter for immunization: Secondary | ICD-10-CM | POA: Diagnosis not present

## 2023-03-08 DIAGNOSIS — N1832 Chronic kidney disease, stage 3b: Secondary | ICD-10-CM | POA: Diagnosis not present

## 2023-03-08 DIAGNOSIS — F330 Major depressive disorder, recurrent, mild: Secondary | ICD-10-CM | POA: Diagnosis not present

## 2023-03-08 DIAGNOSIS — E039 Hypothyroidism, unspecified: Secondary | ICD-10-CM | POA: Diagnosis not present

## 2023-03-08 DIAGNOSIS — E1129 Type 2 diabetes mellitus with other diabetic kidney complication: Secondary | ICD-10-CM | POA: Diagnosis not present

## 2023-03-08 DIAGNOSIS — E559 Vitamin D deficiency, unspecified: Secondary | ICD-10-CM | POA: Diagnosis not present

## 2023-03-08 DIAGNOSIS — E785 Hyperlipidemia, unspecified: Secondary | ICD-10-CM | POA: Diagnosis not present

## 2023-03-08 DIAGNOSIS — M353 Polymyalgia rheumatica: Secondary | ICD-10-CM | POA: Diagnosis not present

## 2023-03-08 DIAGNOSIS — I129 Hypertensive chronic kidney disease with stage 1 through stage 4 chronic kidney disease, or unspecified chronic kidney disease: Secondary | ICD-10-CM | POA: Diagnosis not present

## 2023-03-08 DIAGNOSIS — I773 Arterial fibromuscular dysplasia: Secondary | ICD-10-CM | POA: Diagnosis not present

## 2023-03-08 DIAGNOSIS — G729 Myopathy, unspecified: Secondary | ICD-10-CM | POA: Diagnosis not present

## 2023-03-08 DIAGNOSIS — I712 Thoracic aortic aneurysm, without rupture, unspecified: Secondary | ICD-10-CM | POA: Diagnosis not present

## 2023-03-09 ENCOUNTER — Encounter (HOSPITAL_COMMUNITY): Payer: Medicare Other

## 2023-03-16 ENCOUNTER — Encounter: Payer: Self-pay | Admitting: Genetic Counselor

## 2023-03-19 ENCOUNTER — Other Ambulatory Visit: Payer: Self-pay

## 2023-03-20 ENCOUNTER — Inpatient Hospital Stay: Payer: Medicare Other

## 2023-03-20 ENCOUNTER — Inpatient Hospital Stay: Payer: Medicare Other | Admitting: Genetic Counselor

## 2023-03-20 DIAGNOSIS — M353 Polymyalgia rheumatica: Secondary | ICD-10-CM | POA: Diagnosis not present

## 2023-03-22 DIAGNOSIS — M810 Age-related osteoporosis without current pathological fracture: Secondary | ICD-10-CM | POA: Diagnosis not present

## 2023-04-02 DIAGNOSIS — M6281 Muscle weakness (generalized): Secondary | ICD-10-CM | POA: Diagnosis not present

## 2023-04-02 DIAGNOSIS — K5900 Constipation, unspecified: Secondary | ICD-10-CM | POA: Diagnosis not present

## 2023-04-02 DIAGNOSIS — R278 Other lack of coordination: Secondary | ICD-10-CM | POA: Diagnosis not present

## 2023-04-03 DIAGNOSIS — M5136 Other intervertebral disc degeneration, lumbar region with discogenic back pain only: Secondary | ICD-10-CM | POA: Diagnosis not present

## 2023-04-06 ENCOUNTER — Ambulatory Visit: Payer: Medicare Other | Admitting: Cardiology

## 2023-04-11 DIAGNOSIS — S8012XA Contusion of left lower leg, initial encounter: Secondary | ICD-10-CM | POA: Diagnosis not present

## 2023-05-03 DIAGNOSIS — M1991 Primary osteoarthritis, unspecified site: Secondary | ICD-10-CM | POA: Diagnosis not present

## 2023-05-03 DIAGNOSIS — E559 Vitamin D deficiency, unspecified: Secondary | ICD-10-CM | POA: Diagnosis not present

## 2023-05-03 DIAGNOSIS — R5383 Other fatigue: Secondary | ICD-10-CM | POA: Diagnosis not present

## 2023-05-03 DIAGNOSIS — M79641 Pain in right hand: Secondary | ICD-10-CM | POA: Diagnosis not present

## 2023-05-03 DIAGNOSIS — M353 Polymyalgia rheumatica: Secondary | ICD-10-CM | POA: Diagnosis not present

## 2023-05-03 DIAGNOSIS — M316 Other giant cell arteritis: Secondary | ICD-10-CM | POA: Diagnosis not present

## 2023-05-03 DIAGNOSIS — Z6820 Body mass index (BMI) 20.0-20.9, adult: Secondary | ICD-10-CM | POA: Diagnosis not present

## 2023-05-03 DIAGNOSIS — M79642 Pain in left hand: Secondary | ICD-10-CM | POA: Diagnosis not present

## 2023-05-03 DIAGNOSIS — R202 Paresthesia of skin: Secondary | ICD-10-CM | POA: Diagnosis not present

## 2023-05-03 DIAGNOSIS — M791 Myalgia, unspecified site: Secondary | ICD-10-CM | POA: Diagnosis not present

## 2023-05-08 ENCOUNTER — Other Ambulatory Visit: Payer: Self-pay | Admitting: Genetic Counselor

## 2023-05-08 ENCOUNTER — Inpatient Hospital Stay: Payer: Medicare Other

## 2023-05-08 ENCOUNTER — Inpatient Hospital Stay: Payer: Medicare Other | Admitting: Genetic Counselor

## 2023-05-08 DIAGNOSIS — Z803 Family history of malignant neoplasm of breast: Secondary | ICD-10-CM

## 2023-05-09 DIAGNOSIS — I129 Hypertensive chronic kidney disease with stage 1 through stage 4 chronic kidney disease, or unspecified chronic kidney disease: Secondary | ICD-10-CM | POA: Diagnosis not present

## 2023-05-09 DIAGNOSIS — E1129 Type 2 diabetes mellitus with other diabetic kidney complication: Secondary | ICD-10-CM | POA: Diagnosis not present

## 2023-05-09 DIAGNOSIS — E039 Hypothyroidism, unspecified: Secondary | ICD-10-CM | POA: Diagnosis not present

## 2023-05-09 DIAGNOSIS — Z4681 Encounter for fitting and adjustment of insulin pump: Secondary | ICD-10-CM | POA: Diagnosis not present

## 2023-05-09 DIAGNOSIS — D8940 Mast cell activation, unspecified: Secondary | ICD-10-CM | POA: Diagnosis not present

## 2023-05-09 DIAGNOSIS — I773 Arterial fibromuscular dysplasia: Secondary | ICD-10-CM | POA: Diagnosis not present

## 2023-05-09 DIAGNOSIS — M353 Polymyalgia rheumatica: Secondary | ICD-10-CM | POA: Diagnosis not present

## 2023-05-09 DIAGNOSIS — D47Z9 Other specified neoplasms of uncertain behavior of lymphoid, hematopoietic and related tissue: Secondary | ICD-10-CM | POA: Diagnosis not present

## 2023-05-09 DIAGNOSIS — Z9884 Bariatric surgery status: Secondary | ICD-10-CM | POA: Diagnosis not present

## 2023-05-09 DIAGNOSIS — F330 Major depressive disorder, recurrent, mild: Secondary | ICD-10-CM | POA: Diagnosis not present

## 2023-05-09 DIAGNOSIS — E785 Hyperlipidemia, unspecified: Secondary | ICD-10-CM | POA: Diagnosis not present

## 2023-05-09 DIAGNOSIS — N1832 Chronic kidney disease, stage 3b: Secondary | ICD-10-CM | POA: Diagnosis not present

## 2023-05-10 DIAGNOSIS — M25552 Pain in left hip: Secondary | ICD-10-CM | POA: Diagnosis not present

## 2023-05-10 DIAGNOSIS — M25551 Pain in right hip: Secondary | ICD-10-CM | POA: Diagnosis not present

## 2023-05-10 DIAGNOSIS — M353 Polymyalgia rheumatica: Secondary | ICD-10-CM | POA: Diagnosis not present

## 2023-05-10 DIAGNOSIS — M79642 Pain in left hand: Secondary | ICD-10-CM | POA: Diagnosis not present

## 2023-05-10 DIAGNOSIS — M1991 Primary osteoarthritis, unspecified site: Secondary | ICD-10-CM | POA: Diagnosis not present

## 2023-05-10 DIAGNOSIS — Z6821 Body mass index (BMI) 21.0-21.9, adult: Secondary | ICD-10-CM | POA: Diagnosis not present

## 2023-05-10 DIAGNOSIS — M79641 Pain in right hand: Secondary | ICD-10-CM | POA: Diagnosis not present

## 2023-05-15 DIAGNOSIS — E1129 Type 2 diabetes mellitus with other diabetic kidney complication: Secondary | ICD-10-CM | POA: Diagnosis not present

## 2023-05-15 DIAGNOSIS — E785 Hyperlipidemia, unspecified: Secondary | ICD-10-CM | POA: Diagnosis not present

## 2023-05-15 DIAGNOSIS — M353 Polymyalgia rheumatica: Secondary | ICD-10-CM | POA: Diagnosis not present

## 2023-05-15 DIAGNOSIS — N1832 Chronic kidney disease, stage 3b: Secondary | ICD-10-CM | POA: Diagnosis not present

## 2023-05-15 DIAGNOSIS — I129 Hypertensive chronic kidney disease with stage 1 through stage 4 chronic kidney disease, or unspecified chronic kidney disease: Secondary | ICD-10-CM | POA: Diagnosis not present

## 2023-05-30 DIAGNOSIS — M5416 Radiculopathy, lumbar region: Secondary | ICD-10-CM | POA: Diagnosis not present

## 2023-05-30 DIAGNOSIS — M25551 Pain in right hip: Secondary | ICD-10-CM | POA: Diagnosis not present

## 2023-05-31 ENCOUNTER — Other Ambulatory Visit: Payer: Self-pay | Admitting: Physical Medicine and Rehabilitation

## 2023-05-31 DIAGNOSIS — M5136 Other intervertebral disc degeneration, lumbar region with discogenic back pain only: Secondary | ICD-10-CM

## 2023-06-04 ENCOUNTER — Ambulatory Visit
Admission: RE | Admit: 2023-06-04 | Discharge: 2023-06-04 | Disposition: A | Discharge status: Home / Self Care | Payer: Medicare Other | Source: Ambulatory Visit | Attending: Physical Medicine and Rehabilitation | Admitting: Physical Medicine and Rehabilitation

## 2023-06-04 DIAGNOSIS — M5136 Other intervertebral disc degeneration, lumbar region with discogenic back pain only: Secondary | ICD-10-CM

## 2023-06-04 DIAGNOSIS — M48061 Spinal stenosis, lumbar region without neurogenic claudication: Secondary | ICD-10-CM | POA: Diagnosis not present

## 2023-06-04 DIAGNOSIS — M5126 Other intervertebral disc displacement, lumbar region: Secondary | ICD-10-CM | POA: Diagnosis not present

## 2023-06-05 DIAGNOSIS — E785 Hyperlipidemia, unspecified: Secondary | ICD-10-CM | POA: Diagnosis not present

## 2023-06-05 DIAGNOSIS — N1832 Chronic kidney disease, stage 3b: Secondary | ICD-10-CM | POA: Diagnosis not present

## 2023-06-05 DIAGNOSIS — E1129 Type 2 diabetes mellitus with other diabetic kidney complication: Secondary | ICD-10-CM | POA: Diagnosis not present

## 2023-06-05 DIAGNOSIS — M353 Polymyalgia rheumatica: Secondary | ICD-10-CM | POA: Diagnosis not present

## 2023-06-05 DIAGNOSIS — I129 Hypertensive chronic kidney disease with stage 1 through stage 4 chronic kidney disease, or unspecified chronic kidney disease: Secondary | ICD-10-CM | POA: Diagnosis not present

## 2023-06-06 DIAGNOSIS — M5416 Radiculopathy, lumbar region: Secondary | ICD-10-CM | POA: Diagnosis not present

## 2023-06-06 DIAGNOSIS — M48061 Spinal stenosis, lumbar region without neurogenic claudication: Secondary | ICD-10-CM | POA: Diagnosis not present

## 2023-06-06 DIAGNOSIS — M4722 Other spondylosis with radiculopathy, cervical region: Secondary | ICD-10-CM | POA: Diagnosis not present

## 2023-06-07 DIAGNOSIS — M353 Polymyalgia rheumatica: Secondary | ICD-10-CM | POA: Diagnosis not present

## 2023-06-07 DIAGNOSIS — Z6822 Body mass index (BMI) 22.0-22.9, adult: Secondary | ICD-10-CM | POA: Diagnosis not present

## 2023-06-07 DIAGNOSIS — M1991 Primary osteoarthritis, unspecified site: Secondary | ICD-10-CM | POA: Diagnosis not present

## 2023-06-07 DIAGNOSIS — Z7952 Long term (current) use of systemic steroids: Secondary | ICD-10-CM | POA: Diagnosis not present

## 2023-06-07 DIAGNOSIS — M5459 Other low back pain: Secondary | ICD-10-CM | POA: Diagnosis not present

## 2023-06-08 ENCOUNTER — Ambulatory Visit: Payer: Medicare Other | Admitting: Physical Therapy

## 2023-06-08 DIAGNOSIS — M5416 Radiculopathy, lumbar region: Secondary | ICD-10-CM | POA: Diagnosis not present

## 2023-06-09 ENCOUNTER — Other Ambulatory Visit: Payer: Medicare Other

## 2023-06-14 DIAGNOSIS — Z711 Person with feared health complaint in whom no diagnosis is made: Secondary | ICD-10-CM | POA: Diagnosis not present

## 2023-06-14 DIAGNOSIS — M353 Polymyalgia rheumatica: Secondary | ICD-10-CM | POA: Diagnosis not present

## 2023-06-14 DIAGNOSIS — M48061 Spinal stenosis, lumbar region without neurogenic claudication: Secondary | ICD-10-CM | POA: Diagnosis not present

## 2023-06-15 ENCOUNTER — Ambulatory Visit: Payer: Medicare Other | Admitting: Physical Therapy

## 2023-06-15 DIAGNOSIS — Z961 Presence of intraocular lens: Secondary | ICD-10-CM | POA: Diagnosis not present

## 2023-06-15 DIAGNOSIS — H02882 Meibomian gland dysfunction right lower eyelid: Secondary | ICD-10-CM | POA: Diagnosis not present

## 2023-06-15 DIAGNOSIS — H02884 Meibomian gland dysfunction left upper eyelid: Secondary | ICD-10-CM | POA: Diagnosis not present

## 2023-06-15 DIAGNOSIS — H35372 Puckering of macula, left eye: Secondary | ICD-10-CM | POA: Diagnosis not present

## 2023-06-15 DIAGNOSIS — H04123 Dry eye syndrome of bilateral lacrimal glands: Secondary | ICD-10-CM | POA: Diagnosis not present

## 2023-06-18 DIAGNOSIS — M5136 Other intervertebral disc degeneration, lumbar region with discogenic back pain only: Secondary | ICD-10-CM | POA: Diagnosis not present

## 2023-06-18 DIAGNOSIS — M5431 Sciatica, right side: Secondary | ICD-10-CM | POA: Diagnosis not present

## 2023-06-18 DIAGNOSIS — M9903 Segmental and somatic dysfunction of lumbar region: Secondary | ICD-10-CM | POA: Diagnosis not present

## 2023-06-18 DIAGNOSIS — M4807 Spinal stenosis, lumbosacral region: Secondary | ICD-10-CM | POA: Diagnosis not present

## 2023-06-19 ENCOUNTER — Ambulatory Visit: Payer: Medicare Other | Admitting: Physical Therapy

## 2023-06-19 DIAGNOSIS — M25472 Effusion, left ankle: Secondary | ICD-10-CM | POA: Diagnosis not present

## 2023-06-28 ENCOUNTER — Ambulatory Visit: Payer: Medicare Other | Attending: Internal Medicine | Admitting: Physical Therapy

## 2023-06-28 ENCOUNTER — Encounter: Payer: Self-pay | Admitting: Physical Therapy

## 2023-06-28 ENCOUNTER — Other Ambulatory Visit: Payer: Self-pay

## 2023-06-28 DIAGNOSIS — R279 Unspecified lack of coordination: Secondary | ICD-10-CM | POA: Insufficient documentation

## 2023-06-28 DIAGNOSIS — R293 Abnormal posture: Secondary | ICD-10-CM | POA: Insufficient documentation

## 2023-06-28 DIAGNOSIS — M6281 Muscle weakness (generalized): Secondary | ICD-10-CM | POA: Insufficient documentation

## 2023-06-28 NOTE — Therapy (Signed)
 OUTPATIENT PHYSICAL THERAPY FEMALE PELVIC EVALUATION   Patient Name: Gabrielle Lynch MRN: [id number] DOB:19-Aug-1950, 73 y.o., female Today's Date: 06/28/2023  END OF SESSION:  PT End of Session - 06/28/23 1144     Visit Number 1    Number of Visits 8    Date for PT Re-Evaluation 07/26/23    Authorization Type Medicare/BCBS    PT Start Time 1107    PT Stop Time 1145    PT Time Calculation (min) 38 min    Activity Tolerance Patient tolerated treatment well    Behavior During Therapy WFL for tasks assessed/performed             Past Medical History:  Diagnosis Date   Abnormal Pap smear of cervix    Hx of cryotherapy to cervix in her 71s   Abnormal uterine bleeding    history of fibroids   Anemia    Chronic kidney disease    stage 2   Diabetes mellitus without complication (HCC)    type 2 diagnosed 3 years ago   DJD (degenerative joint disease)    Fibroid    Heart murmur    functional   History of hypertension NO MEDS SINCE LAP GASTRIC BAND 2010 --  WT. 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  STATES SHE CARRIES EPI PEN--BUT HAS NOT HAD ANY RECENT EPISODES.  STATES MANY OF THE MEDICATIONS SHE TAKES ARE TO HELP THIS PROBLEM - INCLUDING  Ketotifen-mast cell inhibitor-not approved in [place].    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)    Osteopenia    Other fracture of sacrum, initial encounter for closed fracture (HCC) 06/2017   PMR (polymyalgia rheumatica) (HCC)    PONV (postoperative nausea and vomiting)    Spondylosis    Past Surgical History:  Procedure Laterality Date   ABDOMINAL HYSTERECTOMY  02/20/2011   Procedure: HYSTERECTOMY ABDOMINAL;  Surgeon: Trellis Paganini, MD;  Location: [facility];  Service: Gynecology;   Laterality: N/A;   BREAST BIOPSY Right 05/02/2013   Pash   CATARACT EXTRACTION, BILATERAL Bilateral 10/2019   CERVICAL BIOPSY  W/ LOOP ELECTRODE EXCISION  1992   CERVIX LESION DESTRUCTION     CESAREAN SECTION  1984, 1987   X2   CHOLECYSTECTOMY     COLONOSCOPY     DILATION AND CURETTAGE OF UTERUS     heavy menses   HYSTEROSCOPY  06/1995   HYSTEROSCOPIC MYOMECTOMY   HYSTEROSCOPY  06/1998   D&C, HYSTEROSCOPY   KNEE SURGERY     X2 right knee   LAPAROSCOPIC GASTRIC BANDING  08-03-2008   [facility] APS SYSTEM   LESION REMOVAL N/A 09/02/2012   Procedure: EXCISION VAGINAL LESION;  Surgeon: Ok Edwards, MD;  Location: [facility];  Service: Gynecology;  Laterality: N/A;  cpt 57100  one hour   PUBOVAGINAL SLING  02/20/2011   Procedure: Leonides Grills;  Surgeon: Kathi Ludwig, MD;  Location: [facility];  Service: Urology;  Laterality: N/A;   RECTOCELE REPAIR N/A 12/19/2016   Procedure: POSTERIOR REPAIR (RECTOCELE);  Surgeon: Patton Salles, MD;  Location: [facility];  Service: Gynecology;  Laterality: N/A;  1.25 hours   SALPINGOOPHORECTOMY  02/20/2011  Procedure: SALPINGO OOPHERECTOMY;  Surgeon: Trellis Paganini, MD;  Location: [facility];  Service: Gynecology;  Laterality: Bilateral;   UPPER GASTROINTESTINAL ENDOSCOPY  06/2011   VENTRAL HERNIA REPAIR  05/24/2011   Procedure: LAPAROSCOPIC VENTRAL HERNIA;  Surgeon: Valarie Merino, MD;  Location: [facility];  Service: General;  Laterality: N/A;   WOUND EXPLORATION Left 02/22/2018   Procedure: EXPLORATION OF MASS  BEHIND LEFT EAR;  Surgeon: Luretha Murphy, MD;  Location: [facility];  Service: General;  Laterality: Left;   Patient Active Problem List   Diagnosis Date Noted   Adjustment disorder with mixed anxiety and depressed mood 04/05/2021   Murmur 04/14/2017   Postmenopausal HRT (hormone replacement therapy) 10/29/2014   Hot flashes, menopausal 10/29/2014   Fitting and adjustment of gastric lap  band 01/14/2013   Obese 01/14/2013   H/O vitamin D deficiency 08/09/2012   Hypothyroidism 08/09/2012   Menopause 08/09/2012   Postoperative seroma-in ventral hernia repair site 07/20/2011   Lapband APS April 2010 07/20/2011   Hypertension    Mast cell disorder    DEGENERATIVE JOINT DISEASE 02/19/2008   SPONDYLOSIS UNSPEC SITE W/O MENTION MYELOPATHY 02/19/2008   SINUS TARSI SYNDROME 02/19/2008   UNEQUAL LEG LENGTH 02/19/2008    PCP: Thana Ates, MD   REFERRING PROVIDER: Thana Ates, MD   REFERRING DIAG: K59.09 (ICD-10-CM) - Chronic constipation  THERAPY DIAG:  Muscle weakness (generalized)  Unspecified lack of coordination  Abnormal posture  Rationale for Evaluation and Treatment: Rehabilitation  ONSET DATE: unknown, has been dealing with for many years  SUBJECTIVE:                                                                                                                                                                                           SUBJECTIVE STATEMENT: Patient reports that since starting prednisone - she has gained 20 lbs and has started to eat and her constipation has cleared. Miralax was also suggested and this has helped her as well. She is tapering off the prednisone for the next few months and she does not want her bowel habits to return to how they once were. Before prednisone, she was able to only have 1 bowel movement per week and this was forced with medication.  Fluid intake: water intake - 2-3 16 oz water bottles  PAIN:  Are you having pain? No NPRS scale: 0/10  PRECAUTIONS: None  RED FLAGS: None   WEIGHT BEARING RESTRICTIONS: No  FALLS:  Has patient fallen in last 6 months? No  OCCUPATION: does not work  ACTIVITY LEVEL : none  PLOF: Independent  PATIENT GOALS: make  sure bowel and pelvic floor connection is intact and strong for the future when tapering off prednisone   PERTINENT HISTORY:  abdominal hysterectomy 02/20/11,  C-sectionx2 1984/1987, laparoscopic gastric banding 08/03/08, pubovaginal sling 02/20/11, rectocele repair 2nd degree 12/19/16, ventral hernia repair 2013, HTN  Sexual abuse: No  BOWEL MOVEMENT: Pain with bowel movement: No Type of bowel movement:Type (Bristol Stool Scale) 4, Frequency 1-2x/day, Strain no, and Splinting   Fully empty rectum: Yes: now that she is on the prednisone  Leakage: No Pads: No Fiber supplement/laxative Yes: miralax every night   URINATION: Pain with urination: No Fully empty bladder: Yes:   Stream: Strong Urgency: Yes  Frequency: WNL Leakage: none Pads: No  INTERCOURSE:  Ability to have vaginal penetration No  Pain with intercourse: none DrynessYes - uses estrogen suppository for this   PREGNANCY: C-section deliveries 2  PROLAPSE: None  OBJECTIVE:  Note: Objective measures were completed at Evaluation unless otherwise noted.  PATIENT SURVEYS:  PFIQ-7: 0  COGNITION: Overall cognitive status: Within functional limits for tasks assessed     SENSATION: Light touch: Appears intact  LUMBAR SPECIAL TESTS:  Single leg stance test: Positive  FUNCTIONAL TESTS:  Squat: unable to reach 90 degrees due to stiffness and discomfort  GAIT: Comments: mild trendelenburg gait pattern with ambulation  POSTURE: rounded shoulders, forward head, decreased lumbar lordosis, and increased thoracic kyphosis   LUMBARAROM/PROM:  A/PROM A/PROM  eval  Flexion   Extension   Right lateral flexion   Left lateral flexion   Right rotation   Left rotation    (Blank rows = not tested)  LOWER EXTREMITY ROM: WNL  Active ROM Right eval Left eval  Hip flexion    Hip extension    Hip abduction    Hip adduction    Hip internal rotation    Hip external rotation    Knee flexion    Knee extension    Ankle dorsiflexion    Ankle plantarflexion    Ankle inversion    Ankle eversion     (Blank rows = not tested)  LOWER EXTREMITY MMT: 4-/5 bilateral hips grossly,  4/5 bilateral knees grossly  MMT Right eval Left eval  Hip flexion    Hip extension    Hip abduction    Hip adduction    Hip internal rotation    Hip external rotation    Knee flexion    Knee extension    Ankle dorsiflexion    Ankle plantarflexion    Ankle inversion    Ankle eversion     (Blank rows = not tested) PALPATION:   General: no significant tension present in bilateral adductors/hip flexors   Pelvic Alignment: WNL  Abdominal: upper chest breathing, abdominal bracing at rest                External Perineal Exam: dryness noted externally, perineal drop present with cough test                              Internal Pelvic Floor: No pain with palpation of superficial or deep pelvic floor muscles. Lack of coordination present between diaphragm and pelvic floor. Patient unable to actively lengthen pelvic floor musculature   Patient confirms identification and approves PT to assess internal pelvic floor and treatment Yes No emotional/communication barriers or cognitive limitation. Patient is motivated to learn. Patient understands and agrees with treatment goals and plan. PT explains patient will be examined in standing,  sitting, and lying down to see how their muscles and joints work. When they are ready, they will be asked to remove their underwear so PT can examine their perineum. The patient is also given the option of providing their own chaperone as one is not provided in our facility. The patient also has the right and is explained the right to defer or refuse any part of the evaluation or treatment including the internal exam. With the patient's consent, PT will use one gloved finger to gently assess the muscles of the pelvic floor, seeing how well it contracts and relaxes and if there is muscle symmetry. After, the patient will get dressed and PT and patient will discuss exam findings and plan of care. PT and patient discuss plan of care, schedule, attendance policy and HEP  activities.  PELVIC MMT:   MMT eval  Vaginal 3/5, 8 quick flicks, 3 sec hold  Internal Anal Sphincter   External Anal Sphincter   Puborectalis   Diastasis Recti   (Blank rows = not tested)        TONE: Within normal limits   PROLAPSE: N/A  TODAY'S TREATMENT:                                                                                                                              DATE:   EVAL 06/28/23: Examination completed, findings reviewed, pt educated on POC, HEP, and self care. Pt motivated to participate in PT and agreeable to attempt recommendations.   Neuro re-ed: Hooklying pelvic floor lengthening with inhalation and shortening with exhalation for AROM 2x10  Hooklying pelvic floor quick flicks 2x10  Self care:  Relative anatomy, connection between the pelvic floor and diaphragm, how intraabdominal pressure management can affect the pelvic floor  Vaginal moisturizers  PATIENT EDUCATION:  Education details: Relative anatomy, connection between the pelvic floor and diaphragm, how intraabdominal pressure management can affect the pelvic floor; Vaginal moisturizers Person educated: Patient Education method: Explanation, Demonstration, Tactile cues, Verbal cues, and Handouts Education comprehension: verbalized understanding, returned demonstration, verbal cues required, tactile cues required, and needs further education  HOME EXERCISE PROGRAM: Access Code: [facility] URL: [email or web address] Date: 06/28/2023 Prepared by: Earna Coder  Exercises - Supine Pelvic Floor Contraction  - 1 x daily - 7 x weekly - 2 sets - 10 reps - Quick Flick Pelvic Floor Contractions in Hooklying  - 1 x daily - 7 x weekly - 2 sets - 10 reps  ASSESSMENT:  CLINICAL IMPRESSION: Patient is a 73 y.o. female  who was seen today for physical therapy evaluation and treatment for chronic constipation. She recently started prednisone which has helped her start eating more, and this  combined with her nightly miralax has helped normalize her bowel movements entirely. She is worried that her bowel movements will return to how they were previously when she starts tapering the prednisone in a few months, and she wishes to ensure her pelvic floor is prepared also.  Internal examination revealed no significant weakness or pain, but a lack of coordination between the pelvic floor and diaphragm. With internal cueing and verbal cueing from PT, pt was able to reach 75% of full AROM of the pelvic floor. Patient had no pain following today's session, tolerated all treatment well. Pt would benefit from additional PT to further address deficits.   OBJECTIVE IMPAIRMENTS: decreased coordination, decreased endurance, decreased mobility, decreased ROM, and decreased strength.   ACTIVITY LIMITATIONS: toileting  PARTICIPATION LIMITATIONS:  none  PERSONAL FACTORS: Age, Past/current experiences, and Time since onset of injury/illness/exacerbation are also affecting patient's functional outcome.   REHAB POTENTIAL: Good  CLINICAL DECISION MAKING: Stable/uncomplicated  EVALUATION COMPLEXITY: Low   GOALS: Goals reviewed with patient? Yes  SHORT TERM GOALS: Target date: 07/26/2023  Pt will be independent with HEP.  Baseline: Goal status: INITIAL  2.  Pt will be independent with diaphragmatic breathing and down training activities in order to improve pelvic floor relaxation. Baseline:  Goal status: INITIAL  3.  Pt will report her BMs are complete due to improved bowel habits and evacuation techniques to improve quality of life. Baseline:  Goal status: INITIAL  LONG TERM GOALS: Target date: 12/26/2023  Pt will be independent with advanced HEP.  Baseline:  Goal status: INITIAL  2.  Pt to demonstrate improved coordination of pelvic floor and breathing mechanics with 10# squat with appropriate synergistic patterns to improve normal pelvic floor coordination and active range of motion with  functional activities. Baseline:  Goal status: INITIAL  3.  Pt will be independent with use of squatty potty, relaxed toileting mechanics, and improved bowel movement techniques in order to increase ease of bowel movements and complete evacuation.   Baseline:  Goal status: INITIAL  4.  Pt to demonstrate at least 4+/5 bil hip strength for improved pelvic stability and functional squats to improve quality of life. Baseline:  Goal status: INITIAL  PLAN:  PT FREQUENCY: 1x/week  PT DURATION: 8 weeks  PLANNED INTERVENTIONS: 97110-Therapeutic exercises, 97530- Therapeutic activity, 97112- Neuromuscular re-education, 97535- Self Care, [id number]- Manual therapy, Patient/Family education, Taping, Dry Needling, Joint mobilization, Spinal mobilization, Scar mobilization, Cryotherapy, and Moist heat  PLAN FOR NEXT SESSION: continued pelvic floor AROM in seated, pelvic floor downtrainingexercises, hip strengthening exercises  Omar Person, PT 06/28/2023, 11:45 AM

## 2023-06-29 DIAGNOSIS — L821 Other seborrheic keratosis: Secondary | ICD-10-CM | POA: Diagnosis not present

## 2023-06-29 DIAGNOSIS — D2271 Melanocytic nevi of right lower limb, including hip: Secondary | ICD-10-CM | POA: Diagnosis not present

## 2023-06-29 DIAGNOSIS — L650 Telogen effluvium: Secondary | ICD-10-CM | POA: Diagnosis not present

## 2023-06-29 DIAGNOSIS — L649 Androgenic alopecia, unspecified: Secondary | ICD-10-CM | POA: Diagnosis not present

## 2023-06-29 DIAGNOSIS — I8312 Varicose veins of left lower extremity with inflammation: Secondary | ICD-10-CM | POA: Diagnosis not present

## 2023-06-29 DIAGNOSIS — D1801 Hemangioma of skin and subcutaneous tissue: Secondary | ICD-10-CM | POA: Diagnosis not present

## 2023-06-29 DIAGNOSIS — I872 Venous insufficiency (chronic) (peripheral): Secondary | ICD-10-CM | POA: Diagnosis not present

## 2023-06-29 DIAGNOSIS — I8311 Varicose veins of right lower extremity with inflammation: Secondary | ICD-10-CM | POA: Diagnosis not present

## 2023-06-29 DIAGNOSIS — D2272 Melanocytic nevi of left lower limb, including hip: Secondary | ICD-10-CM | POA: Diagnosis not present

## 2023-06-29 DIAGNOSIS — L814 Other melanin hyperpigmentation: Secondary | ICD-10-CM | POA: Diagnosis not present

## 2023-06-29 DIAGNOSIS — D225 Melanocytic nevi of trunk: Secondary | ICD-10-CM | POA: Diagnosis not present

## 2023-06-29 DIAGNOSIS — D692 Other nonthrombocytopenic purpura: Secondary | ICD-10-CM | POA: Diagnosis not present

## 2023-07-02 DIAGNOSIS — M4722 Other spondylosis with radiculopathy, cervical region: Secondary | ICD-10-CM | POA: Diagnosis not present

## 2023-07-02 DIAGNOSIS — R609 Edema, unspecified: Secondary | ICD-10-CM | POA: Diagnosis not present

## 2023-07-02 DIAGNOSIS — M47816 Spondylosis without myelopathy or radiculopathy, lumbar region: Secondary | ICD-10-CM | POA: Diagnosis not present

## 2023-07-02 DIAGNOSIS — J0140 Acute pansinusitis, unspecified: Secondary | ICD-10-CM | POA: Diagnosis not present

## 2023-07-02 DIAGNOSIS — Z03818 Encounter for observation for suspected exposure to other biological agents ruled out: Secondary | ICD-10-CM | POA: Diagnosis not present

## 2023-07-02 DIAGNOSIS — M48061 Spinal stenosis, lumbar region without neurogenic claudication: Secondary | ICD-10-CM | POA: Diagnosis not present

## 2023-07-02 DIAGNOSIS — I10 Essential (primary) hypertension: Secondary | ICD-10-CM | POA: Diagnosis not present

## 2023-07-02 DIAGNOSIS — Z79899 Other long term (current) drug therapy: Secondary | ICD-10-CM | POA: Diagnosis not present

## 2023-07-13 DIAGNOSIS — Z79899 Other long term (current) drug therapy: Secondary | ICD-10-CM | POA: Diagnosis not present

## 2023-07-13 DIAGNOSIS — R609 Edema, unspecified: Secondary | ICD-10-CM | POA: Diagnosis not present

## 2023-07-13 DIAGNOSIS — R748 Abnormal levels of other serum enzymes: Secondary | ICD-10-CM | POA: Diagnosis not present

## 2023-07-13 DIAGNOSIS — R519 Headache, unspecified: Secondary | ICD-10-CM | POA: Diagnosis not present

## 2023-07-13 DIAGNOSIS — I10 Essential (primary) hypertension: Secondary | ICD-10-CM | POA: Diagnosis not present

## 2023-07-18 DIAGNOSIS — M4310 Spondylolisthesis, site unspecified: Secondary | ICD-10-CM | POA: Diagnosis not present

## 2023-07-18 DIAGNOSIS — M7138 Other bursal cyst, other site: Secondary | ICD-10-CM | POA: Diagnosis not present

## 2023-07-18 DIAGNOSIS — M48062 Spinal stenosis, lumbar region with neurogenic claudication: Secondary | ICD-10-CM | POA: Diagnosis not present

## 2023-07-19 DIAGNOSIS — Z961 Presence of intraocular lens: Secondary | ICD-10-CM | POA: Diagnosis not present

## 2023-07-19 DIAGNOSIS — H04123 Dry eye syndrome of bilateral lacrimal glands: Secondary | ICD-10-CM | POA: Diagnosis not present

## 2023-07-19 DIAGNOSIS — H02886 Meibomian gland dysfunction of left eye, unspecified eyelid: Secondary | ICD-10-CM | POA: Diagnosis not present

## 2023-07-19 DIAGNOSIS — I10 Essential (primary) hypertension: Secondary | ICD-10-CM | POA: Diagnosis not present

## 2023-07-20 DIAGNOSIS — M6281 Muscle weakness (generalized): Secondary | ICD-10-CM | POA: Diagnosis not present

## 2023-07-20 DIAGNOSIS — M545 Low back pain, unspecified: Secondary | ICD-10-CM | POA: Diagnosis not present

## 2023-07-24 ENCOUNTER — Inpatient Hospital Stay: Payer: Medicare Other

## 2023-07-24 ENCOUNTER — Encounter: Payer: Self-pay | Admitting: Genetic Counselor

## 2023-07-24 ENCOUNTER — Other Ambulatory Visit: Payer: Self-pay | Admitting: Genetic Counselor

## 2023-07-24 ENCOUNTER — Inpatient Hospital Stay: Payer: Medicare Other | Attending: Genetic Counselor | Admitting: Genetic Counselor

## 2023-07-24 DIAGNOSIS — Z803 Family history of malignant neoplasm of breast: Secondary | ICD-10-CM | POA: Insufficient documentation

## 2023-07-24 DIAGNOSIS — Z1501 Genetic susceptibility to malignant neoplasm of breast: Secondary | ICD-10-CM | POA: Diagnosis not present

## 2023-07-24 DIAGNOSIS — Z1379 Encounter for other screening for genetic and chromosomal anomalies: Secondary | ICD-10-CM | POA: Diagnosis not present

## 2023-07-24 LAB — GENETIC SCREENING ORDER

## 2023-07-24 NOTE — Progress Notes (Signed)
 REFERRING PROVIDER: Patton Salles, MD [address] [place],  [place] [address]  PRIMARY PROVIDER:  Thana Ates, MD  PRIMARY REASON FOR VISIT:  1. Family history of breast cancer      HISTORY OF PRESENT ILLNESS:   Gabrielle Lynch, a 73 y.o. female, was seen for a [facility] cancer genetics consultation at the request of Dr. Ardell Isaacs due to a family history of breast cancer.  Gabrielle Lynch presents to clinic today to discuss the possibility of a hereditary predisposition to cancer, genetic testing, and to further clarify her future cancer risks, as well as potential cancer risks for family members.   Gabrielle Lynch is a 73 y.o. female with no personal history of cancer.    CANCER HISTORY:  Oncology History   No history exists.     RISK FACTORS:  Menarche was at age 73-13.  First live birth at age 28.  OCP use for approximately  14-15  years.  Menopausal status: postmenopausal.  HRT use: 5 years. Colonoscopy: yes; normal. Mammogram within the last year: yes. Number of breast biopsies: 1. Up to date with pelvic exams: yes. Any excessive radiation exposure in the past: no  Past Medical History:  Diagnosis Date   Abnormal Pap smear of cervix    Hx of cryotherapy to cervix in her 58s   Abnormal uterine bleeding    history of fibroids   Anemia    Chronic kidney disease    stage 2   Diabetes mellitus without complication (HCC)    type 2 diagnosed 3 years ago   DJD (degenerative joint disease)    Family history of breast cancer    Fibroid    Heart murmur    functional   History of hypertension NO MEDS SINCE LAP GASTRIC BAND 2010 --  WT. 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  STATES SHE CARRIES EPI PEN--BUT HAS NOT HAD  ANY RECENT EPISODES.  STATES MANY OF THE MEDICATIONS SHE TAKES ARE TO HELP THIS PROBLEM - INCLUDING  Ketotifen-mast cell inhibitor-not approved in [place].    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)    Osteopenia    Other fracture of sacrum, initial encounter for closed fracture (HCC) 06/2017   PMR (polymyalgia rheumatica) (HCC)    PONV (postoperative nausea and vomiting)    Spondylosis     Past Surgical History:  Procedure Laterality Date   ABDOMINAL HYSTERECTOMY  02/20/2011   Procedure: HYSTERECTOMY ABDOMINAL;  Surgeon: Trellis Paganini, MD;  Location: [facility];  Service: Gynecology;  Laterality: N/A;   BREAST BIOPSY Right 05/02/2013   [facility]   CATARACT EXTRACTION, BILATERAL Bilateral 10/2019   CERVICAL BIOPSY  W/ LOOP ELECTRODE EXCISION  1992   CERVIX LESION DESTRUCTION     CESAREAN SECTION  1984, 1987   X2   CHOLECYSTECTOMY     COLONOSCOPY     DILATION AND CURETTAGE OF UTERUS     heavy menses   HYSTEROSCOPY  06/1995   HYSTEROSCOPIC MYOMECTOMY   HYSTEROSCOPY  06/1998   D&C, HYSTEROSCOPY   KNEE SURGERY     X2 right knee   LAPAROSCOPIC GASTRIC BANDING  08-03-2008   [facility]  [facility]   LESION REMOVAL N/A 09/02/2012   Procedure: EXCISION VAGINAL LESION;  Surgeon: Ok Edwards, MD;  Location: [facility];  Service: Gynecology;  Laterality: N/A;  cpt 57100  one hour   PUBOVAGINAL SLING  02/20/2011   Procedure: Leonides Grills;  Surgeon: Kathi Ludwig, MD;  Location: [facility];  Service: Urology;  Laterality: N/A;   RECTOCELE REPAIR N/A 12/19/2016   Procedure: POSTERIOR REPAIR (RECTOCELE);  Surgeon: Patton Salles, MD;  Location: [facility];  Service: Gynecology;  Laterality: N/A;  1.25 hours   SALPINGOOPHORECTOMY  02/20/2011   Procedure: SALPINGO OOPHERECTOMY;  Surgeon: Trellis Paganini, MD;  Location: [facility];  Service: Gynecology;  Laterality: Bilateral;   UPPER GASTROINTESTINAL ENDOSCOPY  06/2011   VENTRAL HERNIA REPAIR   05/24/2011   Procedure: LAPAROSCOPIC VENTRAL HERNIA;  Surgeon: Valarie Merino, MD;  Location: [facility];  Service: General;  Laterality: N/A;   WOUND EXPLORATION Left 02/22/2018   Procedure: EXPLORATION OF MASS  BEHIND LEFT EAR;  Surgeon: Luretha Murphy, MD;  Location: [facility];  Service: General;  Laterality: Left;    Social History   Socioeconomic History   Marital status: Married    Spouse name: Not on file   Number of children: 2   Years of education: Not on file   Highest education level: Not on file  Occupational History   Occupation: retired  Tobacco Use   Smoking status: Never   Smokeless tobacco: Never  Vaping Use   Vaping status: Never Used  Substance and Sexual Activity   Alcohol use: No    Alcohol/week: 0.0 standard drinks of alcohol    Comment: social   Drug use: No   Sexual activity: Not Currently    Partners: Male    Birth control/protection: Surgical    Comment: Less than 5 sexual partners, >16 first tme of intercourse  Other Topics Concern   Not on file  Social History Narrative   Lives with husband   Social Drivers of Corporate investment banker Strain: Not on file  Food Insecurity: Not on file  Transportation Needs: Not on file  Physical Activity: Not on file  Stress: Not on file  Social Connections: Not on file     FAMILY HISTORY:  We obtained a detailed, 4-generation family history.  Significant diagnoses are listed below: Family History  Problem Relation Age of Onset   Breast cancer Mother        61's   Hypertension Mother    Thyroid disease Mother        hypothyroid   Lung cancer Father        LUNG   Breast cancer Sister 14   Cancer Paternal Uncle        NOS   Thyroid disease Maternal Grandmother        hypothyroid   Non-Hodgkin's lymphoma Paternal Grandmother    Heart attack Paternal Grandfather       The patient has a son and daughter who are cancer free.  She has a maternal half brother and sister and a  paternal half sister.  Her maternal half sister had breast cancer at age 51's.  She reportedly had genetic testing but we do not have that report.  Both parents are deceased.  Her mother's maternal first cousin had breast cancer.  The patient's father died of lung cancer.  He had a brother with an unknown cancer.  The paternal grandmother had NHL.  Gabrielle Lynch is aware of previous family history of genetic testing for hereditary cancer risks. There is no reported Ashkenazi Jewish ancestry. There is no known consanguinity.  GENETIC COUNSELING ASSESSMENT: Gabrielle Lynch is a 73 y.o. female with a family history of cancer which is somewhat suggestive of a hereditary cancer syndrome and predisposition to cancer given the family history and Jewish ancestry. We, therefore, discussed and recommended the following at today's visit.   DISCUSSION: We discussed that, in general, most cancer is not inherited in families, but instead is sporadic or familial. Sporadic cancers occur by chance and typically happen at older ages (>50 years) as this type of cancer is caused by genetic changes acquired during an individual's lifetime. Some families have more cancers than would be expected by chance; however, the ages or types of cancer are not consistent with a known genetic mutation or known genetic mutations have been ruled out. This type of familial cancer is thought to be due to a combination of multiple genetic, environmental, hormonal, and lifestyle factors. While this combination of factors likely increases the risk of cancer, the exact source of this risk is not currently identifiable or testable.  We discussed that 5 - 10% of breast cancer is hereditary, with most cases associated with BRCA mutations. With Ashkenazi Jewish ancestry, the risk for having a BRCA mutation in the family his higher.  There are other genes that can be associated with hereditary breast cancer syndromes.  These include ATM, CHEK2 and PALB2.  Based on the colon polyps in the patient and her father, along with her Jewish ancestry, there is a higher chance for the founder mutation in [place].  This could change how frequently we recommend colonoscopy.  We discussed that testing is beneficial for several reasons including knowing how to follow individuals after completing their treatment, identifying whether potential treatment options such as PARP inhibitors would be beneficial, and understand if other family members could be at risk for cancer and allow them to undergo genetic testing.   We reviewed the characteristics, features and inheritance patterns of hereditary cancer syndromes. We also discussed genetic testing, including the appropriate family members to test, the process of testing, insurance coverage and turn-around-time for results. We discussed the implications of a negative, positive, carrier and/or variant of uncertain significant result. Gabrielle Lynch  was offered a common hereditary cancer panel (36+ genes) and an expanded pan-cancer panel (70+ genes). Gabrielle Lynch was informed of the benefits and limitations of each panel, including that expanded pan-cancer panels contain genes that do not have clear management guidelines at this point in time.  We also discussed that as the number of genes included on a panel increases, the chances of variants of uncertain significance increases. Gabrielle Lynch decided to pursue genetic testing for the CancerNext-Expanded+RNAinsight gene panel.   The CancerNext-Expanded gene panel offered by [facility] and includes sequencing, rearrangement, and RNA analysis for the following 76 genes: AIP, ALK, APC, ATM, AXIN2, BAP1, BARD1, BMPR1A, BRCA1, BRCA2, BRIP1, CDC73, CDH1, CDK4, CDKN1B, CDKN2A, CEBPA, CHEK2, CTNNA1, DDX41, DICER1, ETV6, FH, FLCN, GATA2, LZTR1, MAX, MBD4, MEN1, MET, MLH1, MSH2, MSH3, MSH6, MUTYH, NF1, NF2, NTHL1, PALB2, PHOX2B, PMS2, POT1, PRKAR1A, PTCH1, PTEN, RAD51C, RAD51D, RB1, RET, RUNX1,  SDHA, SDHAF2, SDHB, SDHC, SDHD, SMAD4, SMARCA4, SMARCB1, SMARCE1, STK11, SUFU, TMEM127, TP53, TSC1, TSC2, VHL, and WT1 (sequencing and deletion/duplication); EGFR, HOXB13, KIT, MITF, PDGFRA, POLD1, and POLE (sequencing only); EPCAM and GREM1 (deletion/duplication only).    Based on Gabrielle Lynch's family history of  cancer and Ashkenazi Jewish ancestry, she meets NCCN criteria for genetic testing. Despite that she meets criteria, she may still have an out of pocket cost.   We discussed that some people do not want to undergo genetic testing due to fear of genetic discrimination.  The Genetic Information Nondiscrimination Act (GINA) was signed into federal law in 2008. GINA prohibits health insurers and most employers from discriminating against individuals based on genetic information (including the results of genetic tests and family history information). According to GINA, health insurance companies cannot consider genetic information to be a preexisting condition, nor can they use it to make decisions regarding coverage or rates. GINA also makes it illegal for most employers to use genetic information in making decisions about hiring, firing, promotion, or terms of employment. It is important to note that GINA does not offer protections for life insurance, disability insurance, or long-term care insurance. GINA does not apply to those in the [employer], those who work for companies with less than 15 employees, and new life insurance or long-term disability insurance policies.  Health status due to a cancer diagnosis is not protected under GINA. More information about GINA can be found by visiting [email or web address].  PLAN: After considering the risks, benefits, and limitations, Gabrielle Lynch provided informed consent to pursue genetic testing and the blood sample was sent to [facility] for analysis of the CancerNext-Expanded+RNAinsight. Results should be available within approximately 2-3 weeks'  time, at which point they will be disclosed by telephone to Gabrielle Lynch, as will any additional recommendations warranted by these results. Gabrielle Lynch will receive a summary of her genetic counseling visit and a copy of her results once available. This information will also be available in Epic.   Lastly, we encouraged Gabrielle Lynch to remain in contact with cancer genetics annually so that we can continuously update the family history and inform her of any changes in cancer genetics and testing that may be of benefit for this family.   Gabrielle Lynch questions were answered to her satisfaction today. Our contact information was provided should additional questions or concerns arise. Thank you for the referral and allowing [place] to share in the care of your patient.   Naiomy Watters P. Lowell Guitar, MS, CGC Licensed, Patent attorney [email or web address] phone: [phone]  60 minutes were spent on the date of the encounter in service to the patient including preparation, face-to-face consultation, documentation and care coordination.  The patient was seen alone.  Drs. Meliton Rattan, and/or [place] were available for questions, if needed..    _______________________________________________________________________ For Office Staff:  Number of people involved in session: 1 Was an Intern/ student involved with case: no

## 2023-07-30 ENCOUNTER — Other Ambulatory Visit (HOSPITAL_COMMUNITY): Payer: Self-pay

## 2023-07-30 DIAGNOSIS — Z9884 Bariatric surgery status: Secondary | ICD-10-CM | POA: Diagnosis not present

## 2023-07-30 DIAGNOSIS — E1129 Type 2 diabetes mellitus with other diabetic kidney complication: Secondary | ICD-10-CM | POA: Diagnosis not present

## 2023-07-30 DIAGNOSIS — E785 Hyperlipidemia, unspecified: Secondary | ICD-10-CM | POA: Diagnosis not present

## 2023-07-30 DIAGNOSIS — M353 Polymyalgia rheumatica: Secondary | ICD-10-CM | POA: Diagnosis not present

## 2023-07-30 DIAGNOSIS — D47Z9 Other specified neoplasms of uncertain behavior of lymphoid, hematopoietic and related tissue: Secondary | ICD-10-CM | POA: Diagnosis not present

## 2023-07-30 DIAGNOSIS — I129 Hypertensive chronic kidney disease with stage 1 through stage 4 chronic kidney disease, or unspecified chronic kidney disease: Secondary | ICD-10-CM | POA: Diagnosis not present

## 2023-07-30 DIAGNOSIS — H43812 Vitreous degeneration, left eye: Secondary | ICD-10-CM | POA: Diagnosis not present

## 2023-07-30 DIAGNOSIS — I700 Atherosclerosis of aorta: Secondary | ICD-10-CM | POA: Diagnosis not present

## 2023-07-30 DIAGNOSIS — E039 Hypothyroidism, unspecified: Secondary | ICD-10-CM | POA: Diagnosis not present

## 2023-07-30 DIAGNOSIS — M48061 Spinal stenosis, lumbar region without neurogenic claudication: Secondary | ICD-10-CM | POA: Diagnosis not present

## 2023-07-30 DIAGNOSIS — F330 Major depressive disorder, recurrent, mild: Secondary | ICD-10-CM | POA: Diagnosis not present

## 2023-07-30 DIAGNOSIS — N1832 Chronic kidney disease, stage 3b: Secondary | ICD-10-CM | POA: Diagnosis not present

## 2023-07-31 ENCOUNTER — Other Ambulatory Visit: Payer: Self-pay | Admitting: Obstetrics and Gynecology

## 2023-07-31 DIAGNOSIS — I151 Hypertension secondary to other renal disorders: Secondary | ICD-10-CM | POA: Diagnosis not present

## 2023-07-31 DIAGNOSIS — Z1231 Encounter for screening mammogram for malignant neoplasm of breast: Secondary | ICD-10-CM

## 2023-07-31 DIAGNOSIS — N1832 Chronic kidney disease, stage 3b: Secondary | ICD-10-CM | POA: Diagnosis not present

## 2023-07-31 DIAGNOSIS — E1122 Type 2 diabetes mellitus with diabetic chronic kidney disease: Secondary | ICD-10-CM | POA: Diagnosis not present

## 2023-08-01 ENCOUNTER — Ambulatory Visit: Payer: Medicare Other | Attending: Internal Medicine | Admitting: Physical Therapy

## 2023-08-01 DIAGNOSIS — M6281 Muscle weakness (generalized): Secondary | ICD-10-CM | POA: Diagnosis not present

## 2023-08-01 DIAGNOSIS — R293 Abnormal posture: Secondary | ICD-10-CM | POA: Diagnosis not present

## 2023-08-01 DIAGNOSIS — R279 Unspecified lack of coordination: Secondary | ICD-10-CM | POA: Insufficient documentation

## 2023-08-01 DIAGNOSIS — M545 Low back pain, unspecified: Secondary | ICD-10-CM | POA: Diagnosis not present

## 2023-08-01 NOTE — Therapy (Signed)
 OUTPATIENT PHYSICAL THERAPY FEMALE PELVIC TREATMENT   Patient Name: Gabrielle Lynch MRN: [id number] DOB:24-Jul-1950, 73 y.o., female Today's Date: 08/01/2023  END OF SESSION:  PT End of Session - 08/01/23 1543     Visit Number 2    Number of Visits 8    Date for PT Re-Evaluation 07/26/23    Authorization Type Medicare/BCBS    PT Start Time 0330    PT Stop Time 0415    PT Time Calculation (min) 45 min    Activity Tolerance Patient tolerated treatment well    Behavior During Therapy WFL for tasks assessed/performed              Past Medical History:  Diagnosis Date   Abnormal Pap smear of cervix    Hx of cryotherapy to cervix in her 18s   Abnormal uterine bleeding    history of fibroids   Anemia    Chronic kidney disease    stage 2   Diabetes mellitus without complication (HCC)    type 2 diagnosed 3 years ago   DJD (degenerative joint disease)    Family history of breast cancer    Fibroid    Heart murmur    functional   History of hypertension NO MEDS SINCE LAP GASTRIC BAND 2010 --  WT. 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  STATES SHE CARRIES EPI PEN--BUT HAS NOT HAD ANY RECENT EPISODES.  STATES MANY OF THE MEDICATIONS SHE TAKES ARE TO HELP THIS PROBLEM - INCLUDING  Ketotifen-mast cell inhibitor-not approved in [place].    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)    Osteopenia    Other fracture of sacrum, initial encounter for closed fracture (HCC) 06/2017   PMR (polymyalgia rheumatica) (HCC)    PONV (postoperative nausea and vomiting)    Spondylosis    Past Surgical History:  Procedure Laterality Date   ABDOMINAL HYSTERECTOMY  02/20/2011   Procedure: HYSTERECTOMY ABDOMINAL;  Surgeon: Trellis Paganini, MD;  Location:  [facility];  Service: Gynecology;  Laterality: N/A;   BREAST BIOPSY Right 05/02/2013   [facility]   CATARACT EXTRACTION, BILATERAL Bilateral 10/2019   CERVICAL BIOPSY  W/ LOOP ELECTRODE EXCISION  1992   CERVIX LESION DESTRUCTION     CESAREAN SECTION  1984, 1987   X2   CHOLECYSTECTOMY     COLONOSCOPY     DILATION AND CURETTAGE OF UTERUS     heavy menses   HYSTEROSCOPY  06/1995   HYSTEROSCOPIC MYOMECTOMY   HYSTEROSCOPY  06/1998   D&C, HYSTEROSCOPY   KNEE SURGERY     X2 right knee   LAPAROSCOPIC GASTRIC BANDING  08-03-2008   [facility] APS SYSTEM   LESION REMOVAL N/A 09/02/2012   Procedure: EXCISION VAGINAL LESION;  Surgeon: Ok Edwards, MD;  Location: [facility];  Service: Gynecology;  Laterality: N/A;  cpt 57100  one hour   PUBOVAGINAL SLING  02/20/2011   Procedure: Leonides Grills;  Surgeon: Kathi Ludwig, MD;  Location: [facility];  Service: Urology;  Laterality: N/A;   RECTOCELE REPAIR N/A 12/19/2016   Procedure: POSTERIOR REPAIR (RECTOCELE);  Surgeon: Patton Salles, MD;  Location: [facility];  Service: Gynecology;  Laterality: N/A;  1.25 hours   SALPINGOOPHORECTOMY  02/20/2011   Procedure: SALPINGO OOPHERECTOMY;  Surgeon: Trellis Paganini, MD;  Location: [facility];  Service: Gynecology;  Laterality: Bilateral;   UPPER GASTROINTESTINAL ENDOSCOPY  06/2011   VENTRAL HERNIA REPAIR  05/24/2011   Procedure: LAPAROSCOPIC VENTRAL HERNIA;  Surgeon: Valarie Merino, MD;  Location: [facility];  Service: General;  Laterality: N/A;   WOUND EXPLORATION Left 02/22/2018   Procedure: EXPLORATION OF MASS  BEHIND LEFT EAR;  Surgeon: Luretha Murphy, MD;  Location: [facility];  Service: General;  Laterality: Left;   Patient Active Problem List   Diagnosis Date Noted   Family history of breast cancer    Adjustment disorder with mixed anxiety and depressed mood 04/05/2021   Murmur 04/14/2017   Postmenopausal HRT (hormone replacement therapy) 10/29/2014   Hot  flashes, menopausal 10/29/2014   Fitting and adjustment of gastric lap band 01/14/2013   Obese 01/14/2013   H/O vitamin D deficiency 08/09/2012   Hypothyroidism 08/09/2012   Menopause 08/09/2012   Postoperative seroma-in ventral hernia repair site 07/20/2011   Lapband APS April 2010 07/20/2011   Hypertension    Mast cell disorder    DEGENERATIVE JOINT DISEASE 02/19/2008   SPONDYLOSIS UNSPEC SITE W/O MENTION MYELOPATHY 02/19/2008   SINUS TARSI SYNDROME 02/19/2008   UNEQUAL LEG LENGTH 02/19/2008    PCP: Thana Ates, MD   REFERRING PROVIDER: Thana Ates, MD   REFERRING DIAG: K59.09 (ICD-10-CM) - Chronic constipation  THERAPY DIAG:  Muscle weakness (generalized)  Unspecified lack of coordination  Abnormal posture  Rationale for Evaluation and Treatment: Rehabilitation  ONSET DATE: unknown, has been dealing with for many years  SUBJECTIVE:                                                                                                                                                                                           SUBJECTIVE STATEMENT: Patient reports that she is currently still tapering off the prednisone - she is very slowly tapering. She is still having regular bowel movements - she contributes this to eating more. She is still feeling good - but she has not been consistent with her HEP.  From eval: Patient reports that since starting prednisone - she has gained 20 lbs and has started to eat and her constipation has cleared. Miralax was also suggested and this has helped her as well. She is tapering off the prednisone for the next few months and she does not want her bowel habits to return to how they once were. Before prednisone, she was able to only have 1 bowel movement per week and this was  forced with medication.  Fluid intake: water intake - 2-3 16 oz water bottles  PAIN:  Are you having pain? No NPRS scale: 0/10  PRECAUTIONS: None  RED  FLAGS: None   WEIGHT BEARING RESTRICTIONS: No  FALLS:  Has patient fallen in last 6 months? No  OCCUPATION: does not work  ACTIVITY LEVEL : none  PLOF: Independent  PATIENT GOALS: make sure bowel and pelvic floor connection is intact and strong for the future when tapering off prednisone   PERTINENT HISTORY:  abdominal hysterectomy 02/20/11, C-sectionx2 1984/1987, laparoscopic gastric banding 08/03/08, pubovaginal sling 02/20/11, rectocele repair 2nd degree 12/19/16, ventral hernia repair 2013, HTN  Sexual abuse: No  BOWEL MOVEMENT: Pain with bowel movement: No Type of bowel movement:Type (Bristol Stool Scale) 4, Frequency 1-2x/day, Strain no, and Splinting   Fully empty rectum: Yes: now that she is on the prednisone  Leakage: No Pads: No Fiber supplement/laxative Yes: miralax every night   URINATION: Pain with urination: No Fully empty bladder: Yes:   Stream: Strong Urgency: Yes  Frequency: WNL Leakage: none Pads: No  INTERCOURSE:  Ability to have vaginal penetration No  Pain with intercourse: none DrynessYes - uses estrogen suppository for this   PREGNANCY: C-section deliveries 2  PROLAPSE: None  OBJECTIVE:  Note: Objective measures were completed at Evaluation unless otherwise noted.  PATIENT SURVEYS:  PFIQ-7: 0  COGNITION: Overall cognitive status: Within functional limits for tasks assessed     SENSATION: Light touch: Appears intact  LUMBAR SPECIAL TESTS:  Single leg stance test: Positive  FUNCTIONAL TESTS:  Squat: unable to reach 90 degrees due to stiffness and discomfort  GAIT: Comments: mild trendelenburg gait pattern with ambulation  POSTURE: rounded shoulders, forward head, decreased lumbar lordosis, and increased thoracic kyphosis   LUMBARAROM/PROM:  A/PROM A/PROM  eval  Flexion   Extension   Right lateral flexion   Left lateral flexion   Right rotation   Left rotation    (Blank rows = not tested)  LOWER EXTREMITY ROM:  WNL  Active ROM Right eval Left eval  Hip flexion    Hip extension    Hip abduction    Hip adduction    Hip internal rotation    Hip external rotation    Knee flexion    Knee extension    Ankle dorsiflexion    Ankle plantarflexion    Ankle inversion    Ankle eversion     (Blank rows = not tested)  LOWER EXTREMITY MMT: 4-/5 bilateral hips grossly, 4/5 bilateral knees grossly  MMT Right eval Left eval  Hip flexion    Hip extension    Hip abduction    Hip adduction    Hip internal rotation    Hip external rotation    Knee flexion    Knee extension    Ankle dorsiflexion    Ankle plantarflexion    Ankle inversion    Ankle eversion     (Blank rows = not tested) PALPATION:   General: no significant tension present in bilateral adductors/hip flexors   Pelvic Alignment: WNL  Abdominal: upper chest breathing, abdominal bracing at rest                External Perineal Exam: dryness noted externally, perineal drop present with cough test                              Internal Pelvic Floor: No pain with palpation of superficial  or deep pelvic floor muscles. Lack of coordination present between diaphragm and pelvic floor. Patient unable to actively lengthen pelvic floor musculature   Patient confirms identification and approves PT to assess internal pelvic floor and treatment Yes No emotional/communication barriers or cognitive limitation. Patient is motivated to learn. Patient understands and agrees with treatment goals and plan. PT explains patient will be examined in standing, sitting, and lying down to see how their muscles and joints work. When they are ready, they will be asked to remove their underwear so PT can examine their perineum. The patient is also given the option of providing their own chaperone as one is not provided in our facility. The patient also has the right and is explained the right to defer or refuse any part of the evaluation or treatment including the  internal exam. With the patient's consent, PT will use one gloved finger to gently assess the muscles of the pelvic floor, seeing how well it contracts and relaxes and if there is muscle symmetry. After, the patient will get dressed and PT and patient will discuss exam findings and plan of care. PT and patient discuss plan of care, schedule, attendance policy and HEP activities.  PELVIC MMT:   MMT eval  Vaginal 3/5, 8 quick flicks, 3 sec hold  Internal Anal Sphincter   External Anal Sphincter   Puborectalis   Diastasis Recti   (Blank rows = not tested)        TONE: Within normal limits   PROLAPSE: N/A  TODAY'S TREATMENT:                                                                                                                              DATE:   EVAL 06/28/23: Examination completed, findings reviewed, pt educated on POC, HEP, and self care. Pt motivated to participate in PT and agreeable to attempt recommendations.   Neuro re-ed: Hooklying pelvic floor lengthening with inhalation and shortening with exhalation for AROM 2x10  Hooklying pelvic floor quick flicks 2x10  Self care:  Relative anatomy, connection between the pelvic floor and diaphragm, how intraabdominal pressure management can affect the pelvic floor  Vaginal moisturizers  08/01/23: Neuro re-ed: Hooklying pelvic floor lengthening with inhalation and shortening with exhalation for AROM 2x10  Hooklying pelvic floor quick flicks 2x10  Hooklying bridge + diaphragmatic breathing 2x10  Sidelying clamshell + reverse clamshell + diaphragmatic breathing 2x10  Sit to stand without arm support + diaphragmatic breathing 2x10  Self care:  Relative anatomy, connection between the pelvic floor and diaphragm, how intraabdominal pressure management can affect the pelvic floor  Vaginal moisturizers Manual therapy  Abdominal massage to encourage digestion and promote relaxation  Abdominal scar tissue mobilization to decrease  myofascial restrictions   PATIENT EDUCATION:  Education details: Relative anatomy, connection between the pelvic floor and diaphragm, how intraabdominal pressure management can affect the pelvic floor; Vaginal moisturizers Person educated: Patient Education method: Explanation, Demonstration, Tactile cues, Verbal  cues, and Handouts Education comprehension: verbalized understanding, returned demonstration, verbal cues required, tactile cues required, and needs further education  HOME EXERCISE PROGRAM: Access Code: [facility] URL: [email or web address] Date: 08/01/2023 Prepared by: Earna Coder  Exercises - Supine Pelvic Floor Contraction  - 1 x daily - 7 x weekly - 2 sets - 10 reps - Quick Flick Pelvic Floor Contractions in Hooklying  - 1 x daily - 7 x weekly - 2 sets - 10 reps - Supine Bridge  - 1 x daily - 7 x weekly - 2 sets - 10 reps - Clamshell  - 1 x daily - 7 x weekly - 2 sets - 10 reps - Sidelying Reverse Clamshell  - 1 x daily - 7 x weekly - 2 sets - 10 reps - Sit to Stand Without Arm Support  - 1 x daily - 7 x weekly - 2 sets - 10 reps  ASSESSMENT:  CLINICAL IMPRESSION: Patient is a 73 y.o. female  who was seen today for physical therapy treatment for chronic constipation. She recently started prednisone which has helped her start eating more, and this combined with her nightly miralax has helped normalize her bowel movements entirely. Review of pelvic floor active range of motion training went very well and patient demonstrates full understanding of this exercise. Addition of lower extremity/pelvic strengthening exercises today with little difficulty. Abdominal massage performed to decrease scar tissue restriction in abdomen and to encourage digestion of large intestine. Patient had no pain following today's session, tolerated all treatment well. Pt would benefit from additional PT to further address deficits.   OBJECTIVE IMPAIRMENTS: decreased coordination, decreased  endurance, decreased mobility, decreased ROM, and decreased strength.   ACTIVITY LIMITATIONS: toileting  PARTICIPATION LIMITATIONS:  none  PERSONAL FACTORS: Age, Past/current experiences, and Time since onset of injury/illness/exacerbation are also affecting patient's functional outcome.   REHAB POTENTIAL: Good  CLINICAL DECISION MAKING: Stable/uncomplicated  EVALUATION COMPLEXITY: Low   GOALS: Goals reviewed with patient? Yes  SHORT TERM GOALS: Target date: 07/26/2023  Pt will be independent with HEP.  Baseline: Goal status: INITIAL  2.  Pt will be independent with diaphragmatic breathing and down training activities in order to improve pelvic floor relaxation. Baseline:  Goal status: INITIAL  3.  Pt will report her BMs are complete due to improved bowel habits and evacuation techniques to improve quality of life. Baseline:  Goal status: INITIAL  LONG TERM GOALS: Target date: 12/26/2023  Pt will be independent with advanced HEP.  Baseline:  Goal status: INITIAL  2.  Pt to demonstrate improved coordination of pelvic floor and breathing mechanics with 10# squat with appropriate synergistic patterns to improve normal pelvic floor coordination and active range of motion with functional activities. Baseline:  Goal status: INITIAL  3.  Pt will be independent with use of squatty potty, relaxed toileting mechanics, and improved bowel movement techniques in order to increase ease of bowel movements and complete evacuation.   Baseline:  Goal status: INITIAL  4.  Pt to demonstrate at least 4+/5 bil hip strength for improved pelvic stability and functional squats to improve quality of life. Baseline:  Goal status: INITIAL  PLAN:  PT FREQUENCY: 1x/week  PT DURATION: 8 weeks  PLANNED INTERVENTIONS: 97110-Therapeutic exercises, 97530- Therapeutic activity, 97112- Neuromuscular re-education, 97535- Self Care, 16109- Manual therapy, Patient/Family education, Taping, Dry  Needling, Joint mobilization, Spinal mobilization, Scar mobilization, Cryotherapy, and Moist heat  PLAN FOR NEXT SESSION: continued pelvic floor AROM in seated, pelvic floor downtrainingexercises, hip strengthening exercises  Omar Person, PT 08/01/2023, 4:08 PM

## 2023-08-06 DIAGNOSIS — M353 Polymyalgia rheumatica: Secondary | ICD-10-CM | POA: Diagnosis not present

## 2023-08-06 DIAGNOSIS — Z7952 Long term (current) use of systemic steroids: Secondary | ICD-10-CM | POA: Diagnosis not present

## 2023-08-06 DIAGNOSIS — M545 Low back pain, unspecified: Secondary | ICD-10-CM | POA: Diagnosis not present

## 2023-08-06 DIAGNOSIS — M6281 Muscle weakness (generalized): Secondary | ICD-10-CM | POA: Diagnosis not present

## 2023-08-06 DIAGNOSIS — Z6824 Body mass index (BMI) 24.0-24.9, adult: Secondary | ICD-10-CM | POA: Diagnosis not present

## 2023-08-06 DIAGNOSIS — M1991 Primary osteoarthritis, unspecified site: Secondary | ICD-10-CM | POA: Diagnosis not present

## 2023-08-08 ENCOUNTER — Ambulatory Visit: Payer: Medicare Other | Admitting: Physical Therapy

## 2023-08-08 ENCOUNTER — Ambulatory Visit
Admission: RE | Admit: 2023-08-08 | Discharge: 2023-08-08 | Disposition: A | Discharge status: Home / Self Care | Source: Ambulatory Visit | Attending: Obstetrics and Gynecology | Admitting: Obstetrics and Gynecology

## 2023-08-08 DIAGNOSIS — Z1231 Encounter for screening mammogram for malignant neoplasm of breast: Secondary | ICD-10-CM

## 2023-08-09 ENCOUNTER — Encounter: Payer: Self-pay | Admitting: Genetic Counselor

## 2023-08-09 DIAGNOSIS — Z1379 Encounter for other screening for genetic and chromosomal anomalies: Secondary | ICD-10-CM | POA: Insufficient documentation

## 2023-08-10 ENCOUNTER — Other Ambulatory Visit: Payer: Self-pay | Admitting: Genetic Counselor

## 2023-08-10 DIAGNOSIS — Z803 Family history of malignant neoplasm of breast: Secondary | ICD-10-CM

## 2023-08-14 ENCOUNTER — Encounter: Payer: Self-pay | Admitting: Obstetrics and Gynecology

## 2023-08-20 ENCOUNTER — Inpatient Hospital Stay: Attending: Genetic Counselor

## 2023-08-20 DIAGNOSIS — M47816 Spondylosis without myelopathy or radiculopathy, lumbar region: Secondary | ICD-10-CM | POA: Diagnosis not present

## 2023-08-20 DIAGNOSIS — Z803 Family history of malignant neoplasm of breast: Secondary | ICD-10-CM

## 2023-08-20 LAB — GENETIC SCREENING ORDER

## 2023-08-22 ENCOUNTER — Ambulatory Visit: Payer: Medicare Other | Admitting: Physical Therapy

## 2023-08-22 DIAGNOSIS — Z23 Encounter for immunization: Secondary | ICD-10-CM | POA: Diagnosis not present

## 2023-08-28 ENCOUNTER — Ambulatory Visit: Payer: Medicare Other | Admitting: Physical Therapy

## 2023-09-11 ENCOUNTER — Ambulatory Visit: Payer: Medicare Other | Admitting: Physical Therapy

## 2023-09-18 ENCOUNTER — Telehealth: Payer: Self-pay | Admitting: Genetic Counselor

## 2023-09-18 ENCOUNTER — Ambulatory Visit: Payer: Self-pay | Admitting: Genetic Counselor

## 2023-09-18 DIAGNOSIS — Z1379 Encounter for other screening for genetic and chromosomal anomalies: Secondary | ICD-10-CM

## 2023-09-18 NOTE — Progress Notes (Signed)
 HPI:  Gabrielle Lynch was previously seen in the [facility] due to a family history of cancer and concerns regarding a hereditary predisposition to cancer. Please refer to our prior cancer genetics clinic note for more information regarding our discussion, assessment and recommendations, at the time. Gabrielle Lynch recent genetic test results were disclosed to her, as were recommendations warranted by these results. These results and recommendations are discussed in more detail below.  CANCER HISTORY:  Oncology History   No history exists.    FAMILY HISTORY:  We obtained a detailed, 4-generation family history.  Significant diagnoses are listed below: Family History  Problem Relation Age of Onset   Breast cancer Mother        24's   Hypertension Mother    Thyroid  disease Mother        hypothyroid   Lung cancer Father        LUNG   Breast cancer Sister 43   Cancer Paternal Uncle        NOS   Thyroid  disease Maternal Grandmother        hypothyroid   Non-Hodgkin's lymphoma Paternal Grandmother    Heart attack Paternal Grandfather        The patient has a son and daughter who are cancer free.  She has a maternal half brother and sister and a paternal half sister.  Her maternal half sister had breast cancer at age 96's.  She reportedly had genetic testing but we do not have that report.  Both parents are deceased.  Her mother's maternal first cousin had breast cancer.   The patient's father died of lung cancer.  He had a brother with an unknown cancer.  The paternal grandmother had NHL.   Gabrielle Lynch is aware of previous family history of genetic testing for hereditary cancer risks. There is no reported Ashkenazi Jewish ancestry. There is no known consanguinity  GENETIC TEST RESULTS: Genetic testing reported out on Sep 17, 2023 through the CancerNext-Expanded+RNAinsight cancer panel found no pathogenic mutations. The CancerNext-Expanded gene panel offered by [facility] and includes sequencing, rearrangement, and RNA analysis for the following 77 genes: AIP, ALK, APC, ATM, BAP1, BARD1, BMPR1A, BRCA1, BRCA2, BRIP1, CDC73, CDH1, CDK4, CDKN1B, CDKN2A, CEBPA, CHEK2, CTNNA1, DDX41, DICER1, ETV6, FH, FLCN, GATA2, LZTR1, MAX, MBD4, MEN1, MET, MLH1, MSH2, MSH3, MSH6, MUTYH, NF1, NF2, NTHL1, PALB2, PHOX2B, PMS2, POT1, PRKAR1A, PTCH1, PTEN, RAD51C, RAD51D, RB1, RET, RPS20, RUNX1, SDHA, SDHAF2, SDHB, SDHC, SDHD, SMAD4, SMARCA4, SMARCB1, SMARCE1, STK11, SUFU, TMEM127, TP53, TSC1, TSC2, VHL, and WT1 (sequencing and deletion/duplication); AXIN2, CTNNA1, DDX41, EGFR, HOXB13, KIT, MBD4, MITF, MSH3, PDGFRA, POLD1 and POLE (sequencing only); EPCAM and GREM1 (deletion/duplication only). RNA data is routinely analyzed for use in variant interpretation for all genes. The test report has been scanned into EPIC and is located under the Molecular Pathology section of the Results Review tab.  A portion of the result report is included below for reference.     We discussed with Gabrielle Lynch that because current genetic testing is not perfect, it is possible there may be a gene mutation in one of these genes that current testing cannot detect, but that chance is small.  We also discussed, that there could be another gene that has not yet been discovered, or that we have not yet tested, that is responsible for the cancer diagnoses in the family. It is also possible there is a hereditary cause for the cancer in the family that Gabrielle Lynch did  not inherit and therefore was not identified in her testing.  Therefore, it is important to remain in touch with cancer genetics in the future so that we can continue to offer Gabrielle Lynch the most up to date genetic testing.   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  At this time, it is unknown if these variants are  associated with increased cancer risk or if they are normal findings, but most variants such as these get reclassified to being inconsequential. They should not be used to make medical management decisions. With time, we suspect the lab will determine the significance of these variants, if any. If we do learn more about them, we will try to contact Gabrielle Lynch to discuss it further. However, it is important to stay in touch with us  periodically and keep the address and phone number up to date.  ADDITIONAL GENETIC TESTING: We discussed with Gabrielle Lynch that her genetic testing was fairly extensive.  If there are genes identified to increase cancer risk that can be analyzed in the future, we would be happy to discuss and coordinate this testing at that time.    CANCER SCREENING RECOMMENDATIONS: Gabrielle Lynch test result is considered negative (normal).  This means that we have not identified a hereditary cause for her family history of cancer at this time.     Possible reasons for Gabrielle Lynch negative genetic test include:  1. There may be a gene mutation in one of these genes that current testing methods cannot detect but that chance is small.  2. There could be another gene that has not yet been discovered, or that we have not yet tested, that is responsible for the cancer diagnoses in the family.  3.  There may be no hereditary risk for cancer in the family. The cancers in Gabrielle Lynch and/or her family may be sporadic/familial or due to other genetic and environmental factors. 4. It is also possible there is a hereditary cause for the cancer in the family that Gabrielle Lynch did not inherit.  Therefore, it is recommended she continue to follow the cancer management and screening guidelines provided by her primary healthcare provider. An individual's cancer risk and medical management are not determined by genetic test results alone. Overall cancer risk assessment incorporates additional factors,  including personal medical history, family history, and any available genetic information that may result in a personalized plan for cancer prevention and surveillance  RECOMMENDATIONS FOR FAMILY MEMBERS:   Since she did not inherit a identifiable mutation in a cancer predisposition gene included on this panel, her children could not have inherited a known mutation from her in one of these genes. Individuals in this family might be at some increased risk of developing cancer, over the general population risk, simply due to the family history of cancer.  We recommended women in this family have a yearly mammogram beginning at age 67, or 76 years younger than the earliest onset of cancer, an annual clinical breast exam, and perform monthly breast self-exams. Women in this family should also have a gynecological exam as recommended by their primary provider. All family members should be referred for colonoscopy starting at age 17, or 24 years younger than the earliest onset of cancer.  FOLLOW-UP: Lastly, we discussed with Gabrielle Lynch that cancer genetics is a rapidly advancing field and it is possible that new  genetic tests will be appropriate for her and/or her family members in the future. We encouraged her to remain in contact with cancer genetics on an annual basis so we can update her personal and family histories and let her know of advances in cancer genetics that may benefit this family.   Our contact number was provided. Gabrielle Lynch questions were answered to her satisfaction, and she knows she is welcome to call us  at anytime with additional questions or concerns.   Gabrielle Shove, MS, [facility] Licensed, Certified Genetic Counselor [email or web address]

## 2023-09-18 NOTE — Telephone Encounter (Signed)
 Revealed negative genetic testing after the RNA was completed.  Patient had normal testing with RNA not able to be completed and the RNA did not find any additional results.  Discussed that we do not know why she cancer in the family. It could be due to a different gene that we are not testing, or maybe our current technology may not be able to pick something up.  It will be important for her to keep in contact with genetics to keep up with whether additional testing may be needed.   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  This will not change medical management.

## 2023-09-20 DIAGNOSIS — H02889 Meibomian gland dysfunction of unspecified eye, unspecified eyelid: Secondary | ICD-10-CM | POA: Diagnosis not present

## 2023-09-20 DIAGNOSIS — H01009 Unspecified blepharitis unspecified eye, unspecified eyelid: Secondary | ICD-10-CM | POA: Diagnosis not present

## 2023-09-20 DIAGNOSIS — M810 Age-related osteoporosis without current pathological fracture: Secondary | ICD-10-CM | POA: Diagnosis not present

## 2023-09-25 DIAGNOSIS — M25572 Pain in left ankle and joints of left foot: Secondary | ICD-10-CM | POA: Diagnosis not present

## 2023-09-25 DIAGNOSIS — L650 Telogen effluvium: Secondary | ICD-10-CM | POA: Diagnosis not present

## 2023-09-25 DIAGNOSIS — M25571 Pain in right ankle and joints of right foot: Secondary | ICD-10-CM | POA: Diagnosis not present

## 2023-09-25 DIAGNOSIS — L570 Actinic keratosis: Secondary | ICD-10-CM | POA: Diagnosis not present

## 2023-09-25 DIAGNOSIS — M48061 Spinal stenosis, lumbar region without neurogenic claudication: Secondary | ICD-10-CM | POA: Diagnosis not present

## 2023-09-25 DIAGNOSIS — L649 Androgenic alopecia, unspecified: Secondary | ICD-10-CM | POA: Diagnosis not present

## 2023-09-25 DIAGNOSIS — M4722 Other spondylosis with radiculopathy, cervical region: Secondary | ICD-10-CM | POA: Diagnosis not present

## 2023-09-25 DIAGNOSIS — H61002 Unspecified perichondritis of left external ear: Secondary | ICD-10-CM | POA: Diagnosis not present

## 2023-10-24 DIAGNOSIS — I129 Hypertensive chronic kidney disease with stage 1 through stage 4 chronic kidney disease, or unspecified chronic kidney disease: Secondary | ICD-10-CM | POA: Diagnosis not present

## 2023-10-24 DIAGNOSIS — E1129 Type 2 diabetes mellitus with other diabetic kidney complication: Secondary | ICD-10-CM | POA: Diagnosis not present

## 2023-10-24 DIAGNOSIS — E785 Hyperlipidemia, unspecified: Secondary | ICD-10-CM | POA: Diagnosis not present

## 2023-10-24 DIAGNOSIS — N1832 Chronic kidney disease, stage 3b: Secondary | ICD-10-CM | POA: Diagnosis not present

## 2023-10-28 ENCOUNTER — Other Ambulatory Visit (HOSPITAL_COMMUNITY): Payer: Self-pay

## 2023-10-29 ENCOUNTER — Encounter (HOSPITAL_COMMUNITY): Payer: Self-pay

## 2023-10-29 ENCOUNTER — Other Ambulatory Visit (HOSPITAL_COMMUNITY): Payer: Self-pay

## 2023-10-29 MED ORDER — MOUNJARO 7.5 MG/0.5ML ~~LOC~~ SOAJ
7.5000 mg | SUBCUTANEOUS | 3 refills | Status: DC
  Filled 2023-10-29: qty 2, 28d supply, fill #0
  Filled 2023-11-21: qty 2, 28d supply, fill #1
  Filled 2023-12-19: qty 2, 28d supply, fill #2
  Filled 2024-01-17: qty 2, 28d supply, fill #3

## 2023-11-12 DIAGNOSIS — M4807 Spinal stenosis, lumbosacral region: Secondary | ICD-10-CM | POA: Diagnosis not present

## 2023-11-12 DIAGNOSIS — M6281 Muscle weakness (generalized): Secondary | ICD-10-CM | POA: Diagnosis not present

## 2023-11-12 DIAGNOSIS — S39012D Strain of muscle, fascia and tendon of lower back, subsequent encounter: Secondary | ICD-10-CM | POA: Diagnosis not present

## 2023-11-13 DIAGNOSIS — F330 Major depressive disorder, recurrent, mild: Secondary | ICD-10-CM | POA: Diagnosis not present

## 2023-11-13 DIAGNOSIS — E039 Hypothyroidism, unspecified: Secondary | ICD-10-CM | POA: Diagnosis not present

## 2023-11-13 DIAGNOSIS — M353 Polymyalgia rheumatica: Secondary | ICD-10-CM | POA: Diagnosis not present

## 2023-11-13 DIAGNOSIS — E1129 Type 2 diabetes mellitus with other diabetic kidney complication: Secondary | ICD-10-CM | POA: Diagnosis not present

## 2023-11-13 DIAGNOSIS — D8940 Mast cell activation, unspecified: Secondary | ICD-10-CM | POA: Diagnosis not present

## 2023-11-13 DIAGNOSIS — Z9884 Bariatric surgery status: Secondary | ICD-10-CM | POA: Diagnosis not present

## 2023-11-13 DIAGNOSIS — D47Z9 Other specified neoplasms of uncertain behavior of lymphoid, hematopoietic and related tissue: Secondary | ICD-10-CM | POA: Diagnosis not present

## 2023-11-13 DIAGNOSIS — I129 Hypertensive chronic kidney disease with stage 1 through stage 4 chronic kidney disease, or unspecified chronic kidney disease: Secondary | ICD-10-CM | POA: Diagnosis not present

## 2023-11-13 DIAGNOSIS — M48061 Spinal stenosis, lumbar region without neurogenic claudication: Secondary | ICD-10-CM | POA: Diagnosis not present

## 2023-11-13 DIAGNOSIS — N1832 Chronic kidney disease, stage 3b: Secondary | ICD-10-CM | POA: Diagnosis not present

## 2023-11-13 DIAGNOSIS — G729 Myopathy, unspecified: Secondary | ICD-10-CM | POA: Diagnosis not present

## 2023-11-13 DIAGNOSIS — M1991 Primary osteoarthritis, unspecified site: Secondary | ICD-10-CM | POA: Diagnosis not present

## 2023-11-13 DIAGNOSIS — M316 Other giant cell arteritis: Secondary | ICD-10-CM | POA: Diagnosis not present

## 2023-11-13 DIAGNOSIS — Z6824 Body mass index (BMI) 24.0-24.9, adult: Secondary | ICD-10-CM | POA: Diagnosis not present

## 2023-11-13 DIAGNOSIS — Z7952 Long term (current) use of systemic steroids: Secondary | ICD-10-CM | POA: Diagnosis not present

## 2023-11-13 DIAGNOSIS — D808 Other immunodeficiencies with predominantly antibody defects: Secondary | ICD-10-CM | POA: Diagnosis not present

## 2023-11-19 ENCOUNTER — Telehealth: Payer: Self-pay | Admitting: Internal Medicine

## 2023-11-19 DIAGNOSIS — M5416 Radiculopathy, lumbar region: Secondary | ICD-10-CM | POA: Diagnosis not present

## 2023-11-19 NOTE — Telephone Encounter (Signed)
 Okay with me if okay with Dr. Abran

## 2023-11-19 NOTE — Telephone Encounter (Signed)
 Good Afternoon Dr. Abran, I received a call from this patient stating that she would like to transfer her care over to Dr. Leigh. Patient was last seen with you in April of 2024. Patient did not specify a reason for the tranfer. Would you please advise.   Thank you.

## 2023-11-19 NOTE — Telephone Encounter (Signed)
 Good Afternoon Dr. Leigh, this patient was calling to schedule an appointment with you. Patient was seen with Dr. Abran in April of 2024. I did let Dr. Abran know and he did state that it was ok. Patient did not specify a reason for wanting to transfer her care over to you. Would you please advise.   Thank you.

## 2023-11-19 NOTE — Telephone Encounter (Signed)
 Sure

## 2023-11-21 ENCOUNTER — Other Ambulatory Visit (HOSPITAL_COMMUNITY): Payer: Self-pay

## 2023-12-03 DIAGNOSIS — I10 Essential (primary) hypertension: Secondary | ICD-10-CM | POA: Diagnosis not present

## 2023-12-03 DIAGNOSIS — H02889 Meibomian gland dysfunction of unspecified eye, unspecified eyelid: Secondary | ICD-10-CM | POA: Diagnosis not present

## 2023-12-03 DIAGNOSIS — H01009 Unspecified blepharitis unspecified eye, unspecified eyelid: Secondary | ICD-10-CM | POA: Diagnosis not present

## 2023-12-18 DIAGNOSIS — M5416 Radiculopathy, lumbar region: Secondary | ICD-10-CM | POA: Diagnosis not present

## 2023-12-19 DIAGNOSIS — H35372 Puckering of macula, left eye: Secondary | ICD-10-CM | POA: Diagnosis not present

## 2023-12-19 DIAGNOSIS — Z961 Presence of intraocular lens: Secondary | ICD-10-CM | POA: Diagnosis not present

## 2023-12-19 DIAGNOSIS — H43813 Vitreous degeneration, bilateral: Secondary | ICD-10-CM | POA: Diagnosis not present

## 2024-01-17 DIAGNOSIS — M47812 Spondylosis without myelopathy or radiculopathy, cervical region: Secondary | ICD-10-CM | POA: Diagnosis not present

## 2024-01-17 DIAGNOSIS — E119 Type 2 diabetes mellitus without complications: Secondary | ICD-10-CM | POA: Diagnosis not present

## 2024-01-17 DIAGNOSIS — G5621 Lesion of ulnar nerve, right upper limb: Secondary | ICD-10-CM | POA: Diagnosis not present

## 2024-01-17 DIAGNOSIS — Z79899 Other long term (current) drug therapy: Secondary | ICD-10-CM | POA: Diagnosis not present

## 2024-01-17 DIAGNOSIS — M353 Polymyalgia rheumatica: Secondary | ICD-10-CM | POA: Diagnosis not present

## 2024-01-17 DIAGNOSIS — K581 Irritable bowel syndrome with constipation: Secondary | ICD-10-CM | POA: Diagnosis not present

## 2024-01-17 DIAGNOSIS — Z1331 Encounter for screening for depression: Secondary | ICD-10-CM | POA: Diagnosis not present

## 2024-01-17 DIAGNOSIS — I10 Essential (primary) hypertension: Secondary | ICD-10-CM | POA: Diagnosis not present

## 2024-01-17 DIAGNOSIS — N183 Chronic kidney disease, stage 3 unspecified: Secondary | ICD-10-CM | POA: Diagnosis not present

## 2024-01-17 DIAGNOSIS — Z23 Encounter for immunization: Secondary | ICD-10-CM | POA: Diagnosis not present

## 2024-01-17 DIAGNOSIS — M8589 Other specified disorders of bone density and structure, multiple sites: Secondary | ICD-10-CM | POA: Diagnosis not present

## 2024-01-17 DIAGNOSIS — G43009 Migraine without aura, not intractable, without status migrainosus: Secondary | ICD-10-CM | POA: Diagnosis not present

## 2024-01-17 DIAGNOSIS — E039 Hypothyroidism, unspecified: Secondary | ICD-10-CM | POA: Diagnosis not present

## 2024-01-17 DIAGNOSIS — Z0000 Encounter for general adult medical examination without abnormal findings: Secondary | ICD-10-CM | POA: Diagnosis not present

## 2024-01-17 DIAGNOSIS — E7800 Pure hypercholesterolemia, unspecified: Secondary | ICD-10-CM | POA: Diagnosis not present

## 2024-01-18 DIAGNOSIS — Z23 Encounter for immunization: Secondary | ICD-10-CM | POA: Diagnosis not present

## 2024-01-21 ENCOUNTER — Other Ambulatory Visit: Payer: Self-pay

## 2024-01-21 DIAGNOSIS — N1832 Chronic kidney disease, stage 3b: Secondary | ICD-10-CM | POA: Diagnosis not present

## 2024-01-21 DIAGNOSIS — M5412 Radiculopathy, cervical region: Secondary | ICD-10-CM

## 2024-01-21 DIAGNOSIS — E039 Hypothyroidism, unspecified: Secondary | ICD-10-CM | POA: Diagnosis not present

## 2024-01-21 DIAGNOSIS — E1129 Type 2 diabetes mellitus with other diabetic kidney complication: Secondary | ICD-10-CM | POA: Diagnosis not present

## 2024-01-21 DIAGNOSIS — E785 Hyperlipidemia, unspecified: Secondary | ICD-10-CM | POA: Diagnosis not present

## 2024-01-21 DIAGNOSIS — I129 Hypertensive chronic kidney disease with stage 1 through stage 4 chronic kidney disease, or unspecified chronic kidney disease: Secondary | ICD-10-CM | POA: Diagnosis not present

## 2024-01-23 ENCOUNTER — Other Ambulatory Visit (HOSPITAL_COMMUNITY): Payer: Self-pay

## 2024-01-23 DIAGNOSIS — E1129 Type 2 diabetes mellitus with other diabetic kidney complication: Secondary | ICD-10-CM | POA: Diagnosis not present

## 2024-01-23 DIAGNOSIS — R82998 Other abnormal findings in urine: Secondary | ICD-10-CM | POA: Diagnosis not present

## 2024-01-23 DIAGNOSIS — R7401 Elevation of levels of liver transaminase levels: Secondary | ICD-10-CM | POA: Diagnosis not present

## 2024-01-23 DIAGNOSIS — I129 Hypertensive chronic kidney disease with stage 1 through stage 4 chronic kidney disease, or unspecified chronic kidney disease: Secondary | ICD-10-CM | POA: Diagnosis not present

## 2024-01-23 DIAGNOSIS — M353 Polymyalgia rheumatica: Secondary | ICD-10-CM | POA: Diagnosis not present

## 2024-01-23 DIAGNOSIS — N1832 Chronic kidney disease, stage 3b: Secondary | ICD-10-CM | POA: Diagnosis not present

## 2024-01-23 DIAGNOSIS — E785 Hyperlipidemia, unspecified: Secondary | ICD-10-CM | POA: Diagnosis not present

## 2024-01-23 DIAGNOSIS — E039 Hypothyroidism, unspecified: Secondary | ICD-10-CM | POA: Diagnosis not present

## 2024-01-23 MED ORDER — MOUNJARO 10 MG/0.5ML ~~LOC~~ SOAJ
10.0000 mg | SUBCUTANEOUS | 11 refills | Status: DC
  Filled 2024-01-24: qty 2, 28d supply, fill #0
  Filled 2024-02-16: qty 2, 28d supply, fill #1

## 2024-01-24 ENCOUNTER — Other Ambulatory Visit (HOSPITAL_COMMUNITY): Payer: Self-pay

## 2024-01-24 ENCOUNTER — Encounter: Payer: Self-pay | Admitting: Gastroenterology

## 2024-01-24 ENCOUNTER — Ambulatory Visit: Admitting: Gastroenterology

## 2024-01-24 VITALS — BP 112/80 | HR 78 | Ht 64.0 in | Wt 157.5 lb

## 2024-01-24 DIAGNOSIS — K5909 Other constipation: Secondary | ICD-10-CM | POA: Diagnosis not present

## 2024-01-24 MED ORDER — POLYETHYLENE GLYCOL 3350 17 G PO PACK: 17.0000 g | PACK | Freq: Every day | ORAL | Status: DC

## 2024-01-24 NOTE — Progress Notes (Signed)
 HPI :  73 year old female here for reassessment of chronic constipation.  Recall she was previously followed by Dr. Luis for years, then transition to Dr. Abran last year.  I am friends with her son-in-law and he asked me to see her for further evaluation.  She reports having constipation ongoing for several years.  She has been on a variety of over-the-counter regimens which have not provided significant relief.  She has had 4 colonoscopies over the years without any polyps, last was in 2019 with Dr. Luis.  She also had a negative Cologuard in November 2023.  She has no family history of colon cancer but her stepfather had colon cancer.   At her visit with Dr. Abran she was tried on some low-dose Linzess.  She states this gave her significant diarrhea with loose stool all the time and it was much too strong for her.  She has been on MiraLAX  more recently, when she uses it every day it again causes loose stools and she has sense of incomplete evacuation, or she is not sure if she has incompletely evacuated.  She has had some difficulty controlling her bowels and has had some accidents on daily MiraLAX .  She has since backed off to using it half dose every other day or so and that has been associated with some mixed results.  If she does not take anything she will not move her bowels and can get quite constipated, however she appears to be quite sensitive with looser stools with urgency if she takes anything too strong.  Stool softeners in the past have not really helped.  She has been on fiber but not for a very long time.  She does have symptoms of hemorrhoids occasionally, if she is constipated can have some scant bleeding on the toilet paper.  She has been on Mounjaro  for a few years.  She states she does have diabetes, and Mounjaro  has worked for her.  She has PMR and has had courses of steroids which can alter her blood sugar so they have wanted to keep her on Mounjaro .  On review of her  medications Levsin is listed but she states she does not take that.  She has been to pelvic floor PT in the past who thought she would benefit from additional therapy, she was last seen in this past April.  She does have a history of pelvic surgeries with hysterectomy, rectocele repair, and pubovaginal sling procedure   Previous procedures as outlined below: COLONOSCOPIES 1.  2001.  Negative 2.  2008.  Negative 3.  2013.  Diverticulosis and hemorrhoids 4.  2019.  Negative for neoplasia   Upper endoscopy 1.  2011.  Evidence of prior lap band procedure 2.  2013.  Evidence of prior lap band procedure.  Grade B esophagitis   Past Medical History:  Diagnosis Date   Abnormal Pap smear of cervix    Hx of cryotherapy to cervix in her 3s   Abnormal uterine bleeding    history of fibroids   Anemia    Chronic kidney disease    stage 2   Diabetes mellitus without complication (HCC)    type 2 diagnosed 3 years ago   DJD (degenerative joint disease)    Family history of breast cancer    Fibroid    Heart murmur    functional   History of hypertension NO MEDS SINCE LAP GASTRIC BAND 2010 --  WT. 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  STATES SHE CARRIES EPI PEN--BUT HAS NOT HAD ANY RECENT EPISODES.  STATES MANY OF THE MEDICATIONS SHE TAKES ARE TO HELP THIS PROBLEM - INCLUDING  Ketotifen-mast cell inhibitor-not approved in [place] .    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)    Osteopenia    Other fracture of sacrum, initial encounter for closed fracture (HCC) 06/2017   PMR (polymyalgia rheumatica)    PONV (postoperative nausea and vomiting)    Spondylosis      Past Surgical History:  Procedure Laterality Date   ABDOMINAL HYSTERECTOMY  02/20/2011    Procedure: HYSTERECTOMY ABDOMINAL;  Surgeon: Toribio LITTIE Campanile, MD;  Location: [facility];  Service: Gynecology;  Laterality: N/A;   BREAST BIOPSY Right 05/02/2013   [facility]   CATARACT EXTRACTION, BILATERAL Bilateral 10/2019   CERVICAL BIOPSY  W/ LOOP ELECTRODE EXCISION  1992   CERVIX LESION DESTRUCTION     CESAREAN SECTION  1984, 1987   X2   CHOLECYSTECTOMY     COLONOSCOPY     DILATION AND CURETTAGE OF UTERUS     heavy menses   HYSTEROSCOPY  06/1995   HYSTEROSCOPIC MYOMECTOMY   HYSTEROSCOPY  06/1998   D&C, HYSTEROSCOPY   KNEE SURGERY     X2 right knee   LAPAROSCOPIC GASTRIC BANDING  08-03-2008   [facility]   LESION REMOVAL N/A 09/02/2012   Procedure: EXCISION VAGINAL LESION;  Surgeon: Curlee VEAR Guan, MD;  Location: [facility];  Service: Gynecology;  Laterality: N/A;  cpt 57100  one hour   PUBOVAGINAL SLING  02/20/2011   Procedure: CARLOYN GLADE;  Surgeon: Arlena LILLETTE Gal, MD;  Location: [facility];  Service: Urology;  Laterality: N/A;   RECTOCELE REPAIR N/A 12/19/2016   Procedure: POSTERIOR REPAIR (RECTOCELE);  Surgeon: Cathlyn JAYSON Nikki Bobie FORBES, MD;  Location: [facility];  Service: Gynecology;  Laterality: N/A;  1.25 hours   SALPINGOOPHORECTOMY  02/20/2011   Procedure: SALPINGO OOPHERECTOMY;  Surgeon: Toribio LITTIE Campanile, MD;  Location: [facility];  Service: Gynecology;  Laterality: Bilateral;   UPPER GASTROINTESTINAL ENDOSCOPY  06/2011   VENTRAL HERNIA REPAIR  05/24/2011   Procedure: LAPAROSCOPIC VENTRAL HERNIA;  Surgeon: Donnice KATHEE Lunger, MD;  Location: [facility];  Service: General;  Laterality: N/A;   WOUND EXPLORATION Left 02/22/2018   Procedure: EXPLORATION OF MASS  BEHIND LEFT EAR;  Surgeon: Lunger Donnice, MD;  Location: [facility];  Service: General;  Laterality: Left;   Family History  Problem Relation Age of Onset   Breast cancer Mother        49's   Hypertension Mother    Thyroid  disease Mother        hypothyroid   Lung cancer  Father        LUNG   Breast cancer Sister 34   Cancer Paternal Uncle        NOS   Thyroid  disease Maternal Grandmother        hypothyroid   Non-Hodgkin's lymphoma Paternal Grandmother    Heart attack Paternal Grandfather    Social History   Tobacco Use   Smoking status: Never   Smokeless tobacco: Never  Vaping Use   Vaping status: Never Used  Substance Use Topics   Alcohol  use: No    Alcohol/week: 0.0 standard drinks of alcohol    Comment: social   Drug use: No   Current Outpatient Medications  Medication Sig Dispense Refill   cetirizine  (ZYRTEC ) 10 MG tablet Take 20 mg by mouth daily.     clonazePAM (KLONOPIN) 1 MG tablet 2 tabs     denosumab  (PROLIA ) 60 MG/ML SOSY injection Inject 60 mg into the skin every 6 (six) months.     EPINEPHrine  (EPI-PEN) 0.3 mg/0.3 mL DEVI Inject into the muscle as needed.     Estradiol  (VAGIFEM ) 10 MCG TABS vaginal tablet Place 1 tablet (10 mcg total) vaginally 2 (two) times a week. Use every night before bed for two weeks when you first begin this medicine, then after the first two weeks, begin using it twice a week. 34 tablet 3   famotidine  (PEPCID ) 20 MG tablet 1 tablet at bedtime as needed     finasteride (PROSCAR) 5 MG tablet 1/2 tablet     hydrOXYzine  (ATARAX /VISTARIL ) 50 MG tablet Take 1 tablet by mouth 2 (two) times daily.     hyoscyamine (LEVSIN SL) 0.125 MG SL tablet Place 1 tablet under the tongue as needed.     losartan (COZAAR) 50 MG tablet      meloxicam  (MOBIC ) 15 MG tablet Take 15 mg by mouth daily.     montelukast  (SINGULAIR ) 10 MG tablet Take 10 mg by mouth at bedtime.     ondansetron  (ZOFRAN -ODT) 4 MG disintegrating tablet Take 1 tablet by mouth as needed.     predniSONE (DELTASONE) 5 MG tablet Take 12.5 mg by mouth daily.     PRESCRIPTION MEDICATION Take 1 tablet by mouth 2 (two) times daily. Ketotifen 5mg  table[place] For mast cell problem     rizatriptan (MAXALT) 10 MG tablet      SYNTHROID  112 MCG  tablet Take 112 mcg by mouth daily before breakfast.     tirzepatide  (MOUNJARO ) 10 MG/0.5ML Pen Inject 10 mg into the skin once a week. 2 mL 11   tirzepatide  (MOUNJARO ) 7.5 MG/0.5ML Pen Inject 7.5 mg into the skin once a week. 2 mL 3   topiramate  (TOPAMAX ) 50 MG tablet Take 50 mg by mouth daily.     TRYPTYR 0.003 % SOLN      VASCEPA 1 g CAPS Take 1 capsule by mouth 2 (two) times daily.     hydrochlorothiazide (HYDRODIURIL) 12.5 MG tablet Take 12.5 mg by mouth every morning.     rosuvastatin (CRESTOR) 20 MG tablet SMARTSIG:1 Tablet(s) By Mouth Every Evening     No current facility-administered medications for this visit.   Allergies  Allergen Reactions   Lisinopril Other (See Comments)   Demerol  Nausea Only     Review of Systems: All systems reviewed and negative except where noted in HPI.   Labs reviewed in care everywhere  Physical Exam: BP 112/80   Pulse 78   Ht 5' 4 (1.626 m)   Wt 157 lb 8 oz (71.4 kg)   LMP  (LMP Unknown)   BMI 27.03 kg/m  Constitutional: Pleasant,well-developed, female in no acute distress. Neurological: Alert and oriented to person place and time. Psychiatric: Normal mood and affect. Behavior is normal.   ASSESSMENT: 73 y.o. female here for assessment of the following  1. Chronic constipation    History reviewed as outlined above.  Has had constipation for years, sounds like some over-the-counter regimens have not been strong enough yet or aggressive therapies have been  associated with loose stools and most recently fecal incontinence.  She has had 4 normal colonoscopies without polyps and a Cologuard in recent years, reassured her that it is unlikely that she has any neoplasia/polyps that would be causing this, which she is concerned about given her stepfather had colon cancer.  On review of her workup it appears she probably has multifactorial constipation with component of slow transit and pelvic floor dysfunction, based on review of pelvic  floor PT notes.  She continues to have sense of incomplete evacuation and recommend she follow-up with pelvic floor PT, as of their last visit in April, they had thought she would benefit from continued pelvic floor PT.  She will call them to reschedule an appointment, I can refer her again if needed.  We otherwise discussed her bowel regimen.  Low-dose Linzess was too strong, even MiraLAX  daily is too strong with loose stools.  Recommend adding fiber to help bulk your stools.  Specifically would use Citrucel once daily to minimize risk for bloating.  I think it is likely she will need to add MiraLAX  with this perhaps a few days per week and can titrate up or down, and use both of these to provide more regularity.  Hopefully this will allow her to move her bowels regularly, evacuate more completely, reduce risk for leakage/incontinence.  Otherwise, she does take Mounjaro  chronically, discussed with her that this could certainly make baseline constipation worse.  We will try to treat through this however if her symptoms persist could consider alternative agents for her diabetes.  She inquires about colonoscopy, again I think that would likely be low yield given her prior workup but can consider doing it sooner than scheduled (2029) if symptoms persist despite adjustment of her regimen, for peace of mind   PLAN: - add Citrucel once daily - adjust Miralax , use PRN - follow up with pelvic floor PT - complete full course of therapy - discussed Mounjaro  - could be influencing her bowels, will try to treat through it but consider adjusting regimen based on her course - reassured her of multiple prior normal colonoscopies, can consider doing it sooner for piece of mind if symptoms persist  I spent total 35 minutes reviewing the chart, time with patient, and documentation.  Marcey Naval, MD [facility]

## 2024-01-24 NOTE — Patient Instructions (Addendum)
 Please purchase the following medications over the counter and take as directed: Citrucel - use one daily  Increase your Miralax  as needed.  Follow up with Pelvic Floor Physical Therapy.   Thank you for entrusting me with your care and for choosing [facility], Dr. Elspeth Naval    _______________________________________________________  If your blood pressure at your visit was 140/90 or greater, please contact your primary care physician to follow up on this.  _______________________________________________________  If you are age 71 or older, your body mass index should be between 23-30. Your Body mass index is 27.03 kg/m. If this is out of the aforementioned range listed, please consider follow up with your Primary Care Provider.  If you are age 51 or younger, your body mass index should be between 19-25. Your Body mass index is 27.03 kg/m. If this is out of the aformentioned range listed, please consider follow up with your Primary Care Provider.   ________________________________________________________  The [facility] providers would like to encourage you to use MYCHART to communicate with providers for non-urgent requests or questions.  Due to long hold times on the telephone, sending your provider a message by [facility] may be a faster and more efficient way to get a response.  Please allow 48 business hours for a response.  Please remember that this is for non-urgent requests.  _______________________________________________________  [facility] is using a team-based approach to care.  Your team is made up of your doctor and two to three APPS. Our APPS (Nurse Practitioners and Physician Assistants) work with your physician to ensure care continuity for you. They are fully qualified to address your health concerns and develop a treatment plan. They communicate directly with your gastroenterologist to care for you. Seeing the Advanced Practice Practitioners  on your physician's team can help you by facilitating care more promptly, often allowing for earlier appointments, access to diagnostic testing, procedures, and other specialty referrals.

## 2024-02-07 DIAGNOSIS — F980 Enuresis not due to a substance or known physiological condition: Secondary | ICD-10-CM | POA: Diagnosis not present

## 2024-02-07 DIAGNOSIS — R300 Dysuria: Secondary | ICD-10-CM | POA: Diagnosis not present

## 2024-02-07 DIAGNOSIS — N390 Urinary tract infection, site not specified: Secondary | ICD-10-CM | POA: Diagnosis not present

## 2024-02-07 DIAGNOSIS — R319 Hematuria, unspecified: Secondary | ICD-10-CM | POA: Diagnosis not present

## 2024-02-08 ENCOUNTER — Ambulatory Visit
Admission: RE | Admit: 2024-02-08 | Discharge: 2024-02-08 | Disposition: A | Discharge status: Home / Self Care | Source: Ambulatory Visit

## 2024-02-08 DIAGNOSIS — M5412 Radiculopathy, cervical region: Secondary | ICD-10-CM

## 2024-02-12 DIAGNOSIS — M353 Polymyalgia rheumatica: Secondary | ICD-10-CM | POA: Diagnosis not present

## 2024-02-12 DIAGNOSIS — M1991 Primary osteoarthritis, unspecified site: Secondary | ICD-10-CM | POA: Diagnosis not present

## 2024-02-12 DIAGNOSIS — E663 Overweight: Secondary | ICD-10-CM | POA: Diagnosis not present

## 2024-02-12 DIAGNOSIS — Z6826 Body mass index (BMI) 26.0-26.9, adult: Secondary | ICD-10-CM | POA: Diagnosis not present

## 2024-02-12 DIAGNOSIS — Z7952 Long term (current) use of systemic steroids: Secondary | ICD-10-CM | POA: Diagnosis not present

## 2024-02-12 DIAGNOSIS — M316 Other giant cell arteritis: Secondary | ICD-10-CM | POA: Diagnosis not present

## 2024-02-18 ENCOUNTER — Other Ambulatory Visit (HOSPITAL_BASED_OUTPATIENT_CLINIC_OR_DEPARTMENT_OTHER): Payer: Self-pay

## 2024-02-18 DIAGNOSIS — J019 Acute sinusitis, unspecified: Secondary | ICD-10-CM | POA: Diagnosis not present

## 2024-02-19 DIAGNOSIS — M542 Cervicalgia: Secondary | ICD-10-CM | POA: Diagnosis not present

## 2024-02-19 DIAGNOSIS — M5412 Radiculopathy, cervical region: Secondary | ICD-10-CM | POA: Diagnosis not present

## 2024-02-20 DIAGNOSIS — J0100 Acute maxillary sinusitis, unspecified: Secondary | ICD-10-CM | POA: Diagnosis not present

## 2024-02-20 DIAGNOSIS — R635 Abnormal weight gain: Secondary | ICD-10-CM | POA: Diagnosis not present

## 2024-02-22 ENCOUNTER — Encounter: Payer: Self-pay | Admitting: Obstetrics and Gynecology

## 2024-02-22 NOTE — Progress Notes (Signed)
 GYNECOLOGY  VISIT   HPI: 73 y.o.   Married  Caucasian female   G2P2 with No LMP recorded (lmp unknown). Patient has had a hysterectomy.   here for: 1 year med follow up - Vagifem .  Not using Vagifem  regularly.   Recent UTI and sinus infection and not sure if these were completely treatment.  She had hematuria and was tx with abx.  Took an abx for URI and developed diarrhea, so it was switched to a Z pack.  Has difficulty voiding.    Dealing with chronic constipation.  Has tried Miralax  and Citrucel.  Asking for suggestions.  Feels tired a lot.   States her PMR is back and she is tapering off of steroids.  Redx one year ago.    Traveling.    GYNECOLOGIC HISTORY: No LMP recorded (lmp unknown). Patient has had a hysterectomy. Contraception:  Hyst  Menopausal hormone therapy:  Vagifem . Hx LEEP 1992.  Last 2 paps:  08/30/21 neg HR HPV neg, 09/07/16 neg  History of abnormal Pap or positive HPV:  no Mammogram:  08/08/23 Breast Density Cat B, BIRADS Cat 1 neg         OB History     Gravida  2   Para  2   Term      Preterm      AB      Living  2      SAB      IAB      Ectopic      Multiple      Live Births                 Patient Active Problem List   Diagnosis Date Noted   Genetic testing 08/09/2023   Family history of breast cancer    Adjustment disorder with mixed anxiety and depressed mood 04/05/2021   Murmur 04/14/2017   Postmenopausal HRT (hormone replacement therapy) 10/29/2014   Hot flashes, menopausal 10/29/2014   Fitting and adjustment of gastric lap band 01/14/2013   Obese 01/14/2013   H/O vitamin D  deficiency 08/09/2012   Hypothyroidism 08/09/2012   Menopause 08/09/2012   Postoperative seroma-in ventral hernia repair site 07/20/2011   Lapband APS April 2010 07/20/2011   Hypertension    Mast cell disorder    DEGENERATIVE JOINT DISEASE 02/19/2008   SPONDYLOSIS UNSPEC SITE W/O MENTION MYELOPATHY 02/19/2008   SINUS TARSI SYNDROME  02/19/2008   UNEQUAL LEG LENGTH 02/19/2008    Past Medical History:  Diagnosis Date   Abnormal Pap smear of cervix    Hx of cryotherapy to cervix in her 40s   Abnormal uterine bleeding    history of fibroids   Anemia    Chronic kidney disease    stage 2   Diabetes mellitus without complication (HCC)    type 2 diagnosed 3 years ago   DJD (degenerative joint disease)    Family history of breast cancer    Fibroid    Heart murmur    functional   History of hypertension NO MEDS SINCE LAP GASTRIC BAND 2010 --  WT. 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  STATES SHE CARRIES EPI PEN--BUT HAS NOT HAD ANY RECENT EPISODES.  STATES MANY OF THE  MEDICATIONS SHE TAKES ARE TO HELP THIS PROBLEM - INCLUDING  Ketotifen-mast cell inhibitor-not approved in [place] .    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)    Osteopenia    Other fracture of sacrum, initial encounter for closed fracture (HCC) 06/2017   PMR (polymyalgia rheumatica)    PONV (postoperative nausea and vomiting)    Spondylosis     Past Surgical History:  Procedure Laterality Date   ABDOMINAL HYSTERECTOMY  02/20/2011   Procedure: HYSTERECTOMY ABDOMINAL;  Surgeon: Toribio LITTIE Campanile, MD;  Location: [facility];  Service: Gynecology;  Laterality: N/A;   BREAST BIOPSY Right 05/02/2013   [facility]   CATARACT EXTRACTION, BILATERAL Bilateral 10/2019   CERVICAL BIOPSY  W/ LOOP ELECTRODE EXCISION  1992   CERVIX LESION DESTRUCTION     CESAREAN SECTION  1984, 1987   X2   CHOLECYSTECTOMY     COLONOSCOPY     DILATION AND CURETTAGE OF UTERUS     heavy menses   HYSTEROSCOPY  06/1995   HYSTEROSCOPIC MYOMECTOMY   HYSTEROSCOPY  06/1998   D&C, HYSTEROSCOPY   KNEE SURGERY     X2 right knee   LAPAROSCOPIC  GASTRIC BANDING  08-03-2008   [facility] APS SYSTEM   LESION REMOVAL N/A 09/02/2012   Procedure: EXCISION VAGINAL LESION;  Surgeon: Curlee VEAR Guan, MD;  Location: [facility];  Service: Gynecology;  Laterality: N/A;  cpt 57100  one hour   PUBOVAGINAL SLING  02/20/2011   Procedure: CARLOYN GLADE;  Surgeon: Arlena LILLETTE Gal, MD;  Location: [facility];  Service: Urology;  Laterality: N/A;   RECTOCELE REPAIR N/A 12/19/2016   Procedure: POSTERIOR REPAIR (RECTOCELE);  Surgeon: Cathlyn JAYSON Nikki Bobie FORBES, MD;  Location: [facility];  Service: Gynecology;  Laterality: N/A;  1.25 hours   SALPINGOOPHORECTOMY  02/20/2011   Procedure: SALPINGO OOPHERECTOMY;  Surgeon: Toribio LITTIE Campanile, MD;  Location: [facility];  Service: Gynecology;  Laterality: Bilateral;   UPPER GASTROINTESTINAL ENDOSCOPY  06/2011   VENTRAL HERNIA REPAIR  05/24/2011   Procedure: LAPAROSCOPIC VENTRAL HERNIA;  Surgeon: Donnice KATHEE Lunger, MD;  Location: [facility];  Service: General;  Laterality: N/A;   WOUND EXPLORATION Left 02/22/2018   Procedure: EXPLORATION OF MASS  BEHIND LEFT EAR;  Surgeon: Lunger Donnice, MD;  Location: [facility];  Service: General;  Laterality: Left;    Current Outpatient Medications  Medication Sig Dispense Refill   cetirizine  (ZYRTEC ) 10 MG tablet Take 20 mg by mouth daily.     clonazePAM (KLONOPIN) 1 MG tablet 2 tabs     denosumab  (PROLIA ) 60 MG/ML SOSY injection Inject 60 mg into the skin every 6 (six) months.     EPINEPHrine  (EPI-PEN) 0.3 mg/0.3 mL DEVI Inject into the muscle as needed.     Estradiol  (VAGIFEM ) 10 MCG TABS vaginal tablet Place 1 tablet (10 mcg total) vaginally 2 (two) times a week. Use every night before bed for two weeks when you first begin this medicine, then after the first two weeks, begin using it twice a week. 34 tablet 3   famotidine  (PEPCID ) 20 MG tablet 1 tablet at bedtime as needed     finasteride (PROSCAR) 5 MG tablet 1/2 tablet     hydrochlorothiazide  (HYDRODIURIL) 12.5 MG tablet Take 12.5 mg by mouth every morning.     hydrOXYzine  (ATARAX /VISTARIL ) 50 MG tablet Take 1 tablet by mouth 2 (two) times daily.     hyoscyamine (LEVSIN SL) 0.125 MG SL tablet Place 1  tablet under the tongue as needed.     losartan (COZAAR) 50 MG tablet      meloxicam  (MOBIC ) 15 MG tablet Take 15 mg by mouth daily.     montelukast  (SINGULAIR ) 10 MG tablet Take 10 mg by mouth at bedtime.     ondansetron  (ZOFRAN -ODT) 4 MG disintegrating tablet Take 1 tablet by mouth as needed.     polyethylene glycol (MIRALAX ) 17 g packet Take 17 g by mouth daily. Titrate as needed     predniSONE (DELTASONE) 5 MG tablet Take 12.5 mg by mouth daily.     PRESCRIPTION MEDICATION Take 1 tablet by mouth 2 (two) times daily. Ketotifen 5mg  tablet[place]. For mast cell problem     rizatriptan (MAXALT) 10 MG tablet      rosuvastatin (CRESTOR) 20 MG tablet SMARTSIG:1 Tablet(s) By Mouth Every Evening     SYNTHROID  112 MCG tablet Take 112 mcg by mouth daily before breakfast.     tirzepatide  (MOUNJARO ) 10 MG/0.5ML Pen Inject 10 mg into the skin once a week. 2 mL 11   tirzepatide  (MOUNJARO ) 7.5 MG/0.5ML Pen Inject 7.5 mg into the skin once a week. 2 mL 3   topiramate  (TOPAMAX ) 50 MG tablet Take 50 mg by mouth daily.     TRYPTYR 0.003 % SOLN      VASCEPA 1 g CAPS Take 1 capsule by mouth 2 (two) times daily.     No current facility-administered medications for this visit.     ALLERGIES: Lisinopril, Rosuvastatin, and Demerol   Family History  Problem Relation Age of Onset   Breast cancer Mother        16's   Hypertension Mother    Thyroid  disease Mother        hypothyroid   Lung cancer Father        LUNG   Breast cancer Sister 65   Cancer Paternal Uncle        NOS   Thyroid  disease Maternal Grandmother        hypothyroid   Non-Hodgkin's lymphoma Paternal Grandmother    Heart attack Paternal Grandfather     Social History   Socioeconomic History   Marital  status: Married    Spouse name: Not on file   Number of children: 2   Years of education: Not on file   Highest education level: Not on file  Occupational History   Occupation: retired  Tobacco Use   Smoking status: Never   Smokeless tobacco: Never  Vaping Use   Vaping status: Never Used  Substance and Sexual Activity   Alcohol use: No    Alcohol/week: 0.0 standard drinks of alcohol    Comment: social   Drug use: No   Sexual activity: Not Currently    Partners: Male    Birth control/protection: Surgical    Comment: hyst, Less than 5 sexual partners, >16 first tme of intercourse  Other Topics Concern   Not on file  Social History Narrative   Lives with husband   Social Drivers of Corporate Investment Banker Strain: Not on file  Food Insecurity: Low Risk  (07/31/2023)   Received from [facility]   Hunger Vital Sign    Within the past 12 months, you worried that your food would run out before you got money to buy more: Never true    Within the past 12 months, the food you bought just didn't last and you didn't have money to get more. : Never  true  Transportation Needs: No Transportation Needs (07/31/2023)   Received from [facility]    In the past 12 months, has lack of reliable transportation kept you from medical appointments, meetings, work or from getting things needed for daily living? : No  Physical Activity: Not on file  Stress: Not on file  Social Connections: Not on file  Intimate Partner Violence: Not on file    Review of Systems  Genitourinary:  Positive for difficulty urinating.      PHYSICAL EXAMINATION:   BP 106/74 (BP Location: Left Arm, Patient Position: Sitting)   Pulse 86   Ht 5' 5 (1.651 m)   Wt 155 lb (70.3 kg)   LMP  (LMP Unknown)   SpO2 99%   BMI 25.79 kg/m     General appearance: alert, cooperative and appears stated age Head: Normocephalic, without obvious abnormality, atraumatic Neck: no adenopathy, supple,  symmetrical, trachea midline and thyroid  normal to inspection and palpation Lungs: clear to auscultation bilaterally Breasts: normal appearance, no masses or tenderness, No nipple retraction or dimpling, No nipple discharge or bleeding, No axillary or supraclavicular adenopathy Heart: regular rate and rhythm Abdomen: soft, non-tender, port is palpable in right abdomen,  no organomegaly Extremities: extremities normal, atraumatic, no cyanosis or edema Skin: Skin color, texture, turgor normal. No rashes or lesions Lymph nodes: Cervical, supraclavicular, and axillary nodes normal. No abnormal inguinal nodes palpated Neurologic: Grossly normal  Pelvic: External genitalia:  no lesions              Urethra:  normal appearing urethra with no masses, tenderness or lesions              Bartholins and Skenes: normal                 Vagina: normal appearing vagina with normal color and discharge, no lesions.  Atrophy noted.               Cervix: absent                Bimanual Exam:  Uterus:  absent              Adnexa: no mass, fullness, tenderness              Rectal exam: yes.  Confirms.              Anus:  normal sphincter tone, no lesions  Chaperone was present for exam:  Clotilda DEL, CMA  ASSESSMENT:  Status post total abdominal hysterectomy for fibroids in 2012.  Final cervical pathology benign.   Status post BSO.  Status post midurethral sling.  Status post rectocele repair. Vaginal atrophy.  Encounter for medication monitoring. Hx UTI.  Difficulty voiding.  Constipation.  Hx of abnormal paps and cryo, LEEP 1992, and conization of cervix.  Osteoporosis.  Treating with Prolia .  Bilateral sacral fracture 06/2017.  PMR.  Anemia. Chronic kidney disease. FH breast cancer in mother and 1/2 sister.  Personal hx negative genetic testing.  Has VUS. Hx lap band.   PLAN:  Refill of Vagifem .  We discussed the benefits of treating urogenital atrophy.  Urinalysis and reflex culture.   Mammogram yearly.  Senna S, Metamucil mentioned to treat constipation.  Dietary suggestions:  increased fiber, lessen carbohydrate intake, increase water , daily cup of coffee in the morning.   Exercise.  Follow up yearly and prn.   34 min  total time was spent for this patient encounter, including preparation, face-to-face  counseling with the patient, coordination of care, and documentation of the encounter.

## 2024-02-25 ENCOUNTER — Encounter: Payer: Self-pay | Admitting: Obstetrics and Gynecology

## 2024-02-25 ENCOUNTER — Ambulatory Visit (INDEPENDENT_AMBULATORY_CARE_PROVIDER_SITE_OTHER): Payer: Self-pay | Admitting: Obstetrics and Gynecology

## 2024-02-25 ENCOUNTER — Ambulatory Visit: Payer: Self-pay | Admitting: Obstetrics and Gynecology

## 2024-02-25 VITALS — BP 106/74 | HR 86 | Ht 65.0 in | Wt 155.0 lb

## 2024-02-25 DIAGNOSIS — Z87440 Personal history of urinary (tract) infections: Secondary | ICD-10-CM | POA: Diagnosis not present

## 2024-02-25 DIAGNOSIS — R39198 Other difficulties with micturition: Secondary | ICD-10-CM

## 2024-02-25 DIAGNOSIS — N952 Postmenopausal atrophic vaginitis: Secondary | ICD-10-CM | POA: Diagnosis not present

## 2024-02-25 DIAGNOSIS — Z5181 Encounter for therapeutic drug level monitoring: Secondary | ICD-10-CM | POA: Diagnosis not present

## 2024-02-25 DIAGNOSIS — K5900 Constipation, unspecified: Secondary | ICD-10-CM

## 2024-02-25 LAB — URINALYSIS, COMPLETE W/RFL CULTURE
Bacteria, UA: NONE SEEN /HPF
Bilirubin Urine: NEGATIVE
Casts: NONE SEEN /LPF
Crystals: NONE SEEN /HPF
Glucose, UA: NEGATIVE
Hgb urine dipstick: NEGATIVE
Ketones, ur: NEGATIVE
Leukocyte Esterase: NEGATIVE
Nitrites, Initial: NEGATIVE
Protein, ur: NEGATIVE
RBC / HPF: NONE SEEN /HPF (ref 0–2)
Specific Gravity, Urine: 1.015 (ref 1.001–1.035)
WBC, UA: NONE SEEN /HPF (ref 0–5)
Yeast: NONE SEEN /HPF
pH: 7.5 (ref 5.0–8.0)

## 2024-02-25 LAB — NO CULTURE INDICATED

## 2024-02-25 MED ORDER — ESTRADIOL 10 MCG VA TABS: 1.0000 | ORAL_TABLET | VAGINAL | 3 refills | Status: AC

## 2024-02-25 NOTE — Patient Instructions (Signed)
 Chronic Constipation Chronic constipation is when a person poops 3 times a week or less for 3 months or longer. It is most common in older adults. What are the causes? This condition may be caused by: Not drinking enough fluid, eating enough fiber, or getting enough exercise. Pregnancy. An anal fissure. This is a tear in the opening of the butt (anus). Bowel obstruction. This is when there is a blockage in your intestine (bowel). Bowel stricture. This is when your bowel narrows. A long-term (chronic) condition, such as: Diabetes or hypothyroidism. A stroke or injury to the spinal cord. Multiple sclerosis or Parkinson's disease. Colon cancer. Dementia. Inflammatory bowel disease (IBD), rectal prolapse, or hemorrhoids. Taking certain medicines. These include: Some pain medicines. Antacids or iron supplements. Water pills (diuretics). Some blood pressure medicines. Medicines to keep you from having seizures (anti-seizure medicines). Antidepressants. Medicines for Parkinson's disease. Other causes may include: Stress. Problems with the nerves and muscles that help you poop. Weak muscles in the area between your hip bones (pelvis). What increases the risk? You may be more at risk for this condition if: You are older than 72 years of age. You are female. You live in a long-term care facility. You have a chronic condition. You have a mental health disorder or eating disorder. What are the signs or symptoms? The main sign of this condition is pooping 3 times a week or less. Other signs and symptoms may include: Pushing hard (straining) to poop. Hard or lumpy poop (stool). Pain when you poop. Discomfort in your lower abdomen. This may include cramps and bloating. Not being able to poop when you need to. Feeling like you still need to poop after you are done. Feeling like there is something in your rectum that is blocking or stopping you from pooping. Blood in your poop or seeing  blood on the toilet paper after you poop. Confusion. This is most common in older adults. How is this diagnosed? This condition may be diagnosed based on your symptoms and medical history. You may also need: An exam of your abdomen. A digital rectal exam. For this exam, your health care provider will put a gloved finger into your rectum. Testing. Tests may include: Imaging studies, such as an X-ray, ultrasound, or CT scan. Blood tests. A colonoscopy. This is a test that looks at your colon. A test of how well your anal sphincter works. This is a muscle shaped like a ring. It helps your anus open and close. A test of how well food moves through your colon. An electromyography. This is a test of the nerves in your pelvis. How is this treated? Treatment depends on the cause. You may need to: Be more active. Drink more fluids. Add fiber to your diet. Sources of fiber include fruits, vegetables, and whole grains. You may also need to take a fiber supplement. Stop or change medicines that can cause constipation. Take medicines, such as: Pro-motility agents. These help your muscles tighten (contract). This can help you push out poop. Stool softeners. These soften your poop. An osmotic laxative. A laxative is a medicine that helps you poop. This type works by [employer] into your colon. Train the muscles in your pelvis with biofeedback. Have surgery. This may be done if something is blocking your bowels. You may also need to see an expert in problems with the digestive system (gastroenterologist). Follow these instructions at home: Medicines Take over-the-counter and prescription medicines only as told by your provider. If you are taking  a laxative, take it only as told by your provider. Eating and drinking  Drink enough fluid to keep your pee (urine) pale yellow. Eat foods that are high in fiber, such as beans, whole grains, and fresh fruits and vegetables. Limit foods that are high  in fat and processed sugars, such as fried or sweet foods. Stay away from alcohol, caffeine, and soda. These can make constipation worse. General instructions Get physical activity every day. Ask your provider what activities are safe for you. Get colon cancer screenings as told by your provider. Contact a health care provider if: You poop 3 times a week or less. Your poop is hard or lumpy. There is blood on the toilet paper or in your poop. You lose weight for no clear reason. You have pain in your rectum. Poop is leaking out of your rectum. You have nausea or vomiting. This information is not intended to replace advice given to you by your health care provider. Make sure you discuss any questions you have with your health care provider. Document Revised: 01/30/2022 Document Reviewed: 01/30/2022 Elsevier Patient Education  2024 [employer].

## 2024-03-05 DIAGNOSIS — M5412 Radiculopathy, cervical region: Secondary | ICD-10-CM | POA: Diagnosis not present

## 2024-03-06 DIAGNOSIS — E538 Deficiency of other specified B group vitamins: Secondary | ICD-10-CM | POA: Diagnosis not present

## 2024-03-06 DIAGNOSIS — D649 Anemia, unspecified: Secondary | ICD-10-CM | POA: Diagnosis not present

## 2024-03-10 DIAGNOSIS — Z7184 Encounter for health counseling related to travel: Secondary | ICD-10-CM | POA: Diagnosis not present

## 2024-03-10 DIAGNOSIS — Z23 Encounter for immunization: Secondary | ICD-10-CM | POA: Diagnosis not present

## 2024-03-11 DIAGNOSIS — M5412 Radiculopathy, cervical region: Secondary | ICD-10-CM | POA: Diagnosis not present

## 2024-03-11 DIAGNOSIS — M4727 Other spondylosis with radiculopathy, lumbosacral region: Secondary | ICD-10-CM | POA: Diagnosis not present

## 2024-03-12 ENCOUNTER — Other Ambulatory Visit (HOSPITAL_COMMUNITY): Payer: Self-pay

## 2024-03-12 DIAGNOSIS — M353 Polymyalgia rheumatica: Secondary | ICD-10-CM | POA: Diagnosis not present

## 2024-03-12 DIAGNOSIS — E1122 Type 2 diabetes mellitus with diabetic chronic kidney disease: Secondary | ICD-10-CM | POA: Diagnosis not present

## 2024-03-12 DIAGNOSIS — Z4681 Encounter for fitting and adjustment of insulin pump: Secondary | ICD-10-CM | POA: Diagnosis not present

## 2024-03-12 DIAGNOSIS — I129 Hypertensive chronic kidney disease with stage 1 through stage 4 chronic kidney disease, or unspecified chronic kidney disease: Secondary | ICD-10-CM | POA: Diagnosis not present

## 2024-03-12 DIAGNOSIS — D808 Other immunodeficiencies with predominantly antibody defects: Secondary | ICD-10-CM | POA: Diagnosis not present

## 2024-03-12 DIAGNOSIS — I773 Arterial fibromuscular dysplasia: Secondary | ICD-10-CM | POA: Diagnosis not present

## 2024-03-12 DIAGNOSIS — M5090 Cervical disc disorder, unspecified, unspecified cervical region: Secondary | ICD-10-CM | POA: Diagnosis not present

## 2024-03-12 DIAGNOSIS — E785 Hyperlipidemia, unspecified: Secondary | ICD-10-CM | POA: Diagnosis not present

## 2024-03-12 DIAGNOSIS — M8588 Other specified disorders of bone density and structure, other site: Secondary | ICD-10-CM | POA: Diagnosis not present

## 2024-03-12 DIAGNOSIS — N1832 Chronic kidney disease, stage 3b: Secondary | ICD-10-CM | POA: Diagnosis not present

## 2024-03-12 DIAGNOSIS — I712 Thoracic aortic aneurysm, without rupture, unspecified: Secondary | ICD-10-CM | POA: Diagnosis not present

## 2024-03-12 DIAGNOSIS — E039 Hypothyroidism, unspecified: Secondary | ICD-10-CM | POA: Diagnosis not present

## 2024-03-12 MED ORDER — MOUNJARO 12.5 MG/0.5ML ~~LOC~~ SOAJ
12.5000 mg | SUBCUTANEOUS | 5 refills | Status: AC
  Filled 2024-03-12: qty 2, 28d supply, fill #0
  Filled 2024-04-03: qty 2, 28d supply, fill #1
  Filled 2024-04-29: qty 2, 28d supply, fill #2
  Filled 2024-05-22: qty 2, 28d supply, fill #3

## 2024-04-01 DIAGNOSIS — I151 Hypertension secondary to other renal disorders: Secondary | ICD-10-CM | POA: Diagnosis not present

## 2024-04-01 DIAGNOSIS — N1832 Chronic kidney disease, stage 3b: Secondary | ICD-10-CM | POA: Diagnosis not present

## 2024-04-01 DIAGNOSIS — E1122 Type 2 diabetes mellitus with diabetic chronic kidney disease: Secondary | ICD-10-CM | POA: Diagnosis not present

## 2024-04-07 ENCOUNTER — Encounter: Payer: Self-pay | Admitting: Podiatry

## 2024-04-07 ENCOUNTER — Ambulatory Visit: Admitting: Podiatry

## 2024-04-07 VITALS — Ht 65.0 in | Wt 155.0 lb

## 2024-04-07 DIAGNOSIS — L600 Ingrowing nail: Secondary | ICD-10-CM

## 2024-04-07 DIAGNOSIS — L03032 Cellulitis of left toe: Secondary | ICD-10-CM

## 2024-04-07 NOTE — Patient Instructions (Signed)

## 2024-04-08 NOTE — Progress Notes (Signed)
 Subjective:   Patient ID: Gabrielle Lynch, female   DOB: 73 y.o.   MRN: [id number]   HPI Stating she has had painful ingrown toenails for a long time but they are getting worse and states that the left one bothers her more than the right and that they are getting ready to go on a trip.  Patient does not smoke likes to be active   Review of Systems  All other systems reviewed and are negative.       Objective:  Physical Exam Vitals and nursing note reviewed.  Constitutional:      Appearance: She is well-developed.  Pulmonary:     Effort: Pulmonary effort is normal.  Musculoskeletal:        General: Normal range of motion.  Skin:    General: Skin is warm.  Neurological:     Mental Status: She is alert.     Neurovascular status intact muscle strength found to be adequate range of motion within normal limits with patient found to have incurvated left hallux more on the medial than the lateral border that is painful when pressed and on the right the same condition not to the same degree.  The left shows slight redness and irritation no active drainage was noted.  Good digital perfusion well-oriented x 3     Assessment:  Low-grade paronychia infection with somewhat chronic ingrown toenail deformity bilateral left and right     Plan:  H&P reviewed condition and with her going on a trip I do not recommend current permanent removal of the nail border but I do think long-term this will be necessary.  Patient wants to get some but were doing something temporary today and she does have light redness and irritation of the left and I anesthetized the left big toe I carefully removed the borders with sterile instrumentation I flushed them out applied sterile dressing and just trimmed the right hallux and patient will be seen back to recheck with all questions answered today     Patient presents

## 2024-04-15 ENCOUNTER — Telehealth: Payer: Self-pay | Admitting: Gastroenterology

## 2024-04-15 NOTE — Telephone Encounter (Signed)
 Inbound call from patient stating that she is currently on a cruise until December the 29 th. Patient would like to speak to the nurse to advise her what she is taking due to her having really bad constipation. A good call back number for the patient is [phone]. please advise.

## 2024-04-15 NOTE — Telephone Encounter (Signed)
 Patient states that she has been having constipation for several days now. Is currently on a cruise and wants to know what she can do. State she is taking Citrucel once daily and is taking as many stool softeners as you can take in a day without relief. She IS having bowel movements, just remains constipated. Patient denies taking Miralax . I have instructed patient that she should add Miralax  1 capful dissolved in at least 8 oz water /juice daily over the next 3-4 days and then can titrate up or down based on her bowels thereafter. Discussed pelvic floor therapy. Patient says she has not done this again because she has been out of town so much but she does plan to follow up at a later time once her schedule allows. She is still taking Monjauro which we have discussed can also slow the gut and bowel movements and she acknowledges this. She will call back should her symptoms worsen or fail to improve with above recommendation.

## 2024-04-29 ENCOUNTER — Other Ambulatory Visit (HOSPITAL_COMMUNITY): Payer: Self-pay

## 2024-05-13 ENCOUNTER — Telehealth: Payer: Self-pay | Admitting: *Deleted

## 2024-05-13 NOTE — Telephone Encounter (Signed)
 Spoke with patient. Patient wanted to confirm last BMD on file and approximately when she started Prolia .   Last BMD on file 06/04/20 at [facility].  Patient states she completed BMD 06/2022 and is now scheduled 06/2024. Copy requested from [facility] for Dr. Nikki.   Per review of prior OV notes, PCP or Ortho started Prolia  approximately 12/2018. Patient states she is uncertain who initiated, she is planning dental work and needed to determine how long she has been on medication. Patient will f/u with PCP and ortho. Nothing additional needed at this time. Patient appreciative of call.   Routing to provider for final review. Patient is agreeable to disposition. Will close encounter.

## 2024-05-15 ENCOUNTER — Ambulatory Visit: Payer: Self-pay | Admitting: Obstetrics and Gynecology

## 2024-06-19 ENCOUNTER — Ambulatory Visit: Admitting: Psychology

## 2025-02-25 ENCOUNTER — Encounter: Admitting: Obstetrics and Gynecology
